# Patient Record
Sex: Female | Born: 1990 | Race: Black or African American | Hispanic: No | Marital: Single | Smoking: Current every day smoker
Health system: Southern US, Community
[De-identification: ages and names within clinical notes are randomized; demographics above are authoritative.]

## PROBLEM LIST (undated history)

## (undated) ENCOUNTER — Emergency Department (HOSPITAL_COMMUNITY): Admission: EM | Payer: Medicaid Other | Source: Home / Self Care

## (undated) DIAGNOSIS — Z87440 Personal history of urinary (tract) infections: Secondary | ICD-10-CM

## (undated) DIAGNOSIS — F329 Major depressive disorder, single episode, unspecified: Secondary | ICD-10-CM

## (undated) DIAGNOSIS — L02411 Cutaneous abscess of right axilla: Secondary | ICD-10-CM

## (undated) DIAGNOSIS — L02412 Cutaneous abscess of left axilla: Secondary | ICD-10-CM

## (undated) DIAGNOSIS — F32A Depression, unspecified: Secondary | ICD-10-CM

## (undated) DIAGNOSIS — Z789 Other specified health status: Secondary | ICD-10-CM

## (undated) DIAGNOSIS — Z8669 Personal history of other diseases of the nervous system and sense organs: Secondary | ICD-10-CM

## (undated) DIAGNOSIS — L732 Hidradenitis suppurativa: Secondary | ICD-10-CM

## (undated) DIAGNOSIS — J45909 Unspecified asthma, uncomplicated: Secondary | ICD-10-CM

## (undated) DIAGNOSIS — F319 Bipolar disorder, unspecified: Secondary | ICD-10-CM

## (undated) DIAGNOSIS — G8922 Chronic post-thoracotomy pain: Secondary | ICD-10-CM

## (undated) HISTORY — DX: Hidradenitis suppurativa: L73.2

## (undated) HISTORY — PX: TUBAL LIGATION: SHX77

## (undated) HISTORY — PX: INCISION AND DRAINAGE: SHX5863

## (undated) HISTORY — DX: Personal history of other diseases of the nervous system and sense organs: Z86.69

## (undated) HISTORY — DX: Chronic post-thoracotomy pain: G89.22

## (undated) HISTORY — DX: Personal history of urinary (tract) infections: Z87.440

## (undated) HISTORY — DX: Bipolar disorder, unspecified: F31.9

## (undated) HISTORY — PX: OTHER SURGICAL HISTORY: SHX169

---
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
## 2016-10-31 ENCOUNTER — Emergency Department
Admission: EM | Admit: 2016-10-31 | Discharge: 2016-10-31 | Disposition: A | Discharge status: Home / Self Care | Payer: Self-pay | Attending: Emergency Medicine | Admitting: Emergency Medicine

## 2016-10-31 DIAGNOSIS — L732 Hidradenitis suppurativa: Secondary | ICD-10-CM | POA: Insufficient documentation

## 2016-10-31 DIAGNOSIS — F17200 Nicotine dependence, unspecified, uncomplicated: Secondary | ICD-10-CM | POA: Insufficient documentation

## 2016-10-31 DIAGNOSIS — L02411 Cutaneous abscess of right axilla: Secondary | ICD-10-CM | POA: Insufficient documentation

## 2016-10-31 HISTORY — DX: Cutaneous abscess of left axilla: L02.412

## 2016-10-31 HISTORY — DX: Cutaneous abscess of right axilla: L02.411

## 2016-10-31 MED ORDER — DOXYCYCLINE HYCLATE 100 MG PO TABS: 100.0000 mg | ORAL_TABLET | Freq: Two times a day (BID) | ORAL | 0 refills | Status: DC

## 2016-10-31 MED ORDER — TRAMADOL HCL 50 MG PO TABS: 50.0000 mg | ORAL_TABLET | Freq: Two times a day (BID) | ORAL | 0 refills | Status: DC

## 2016-10-31 MED ORDER — PENTAFLUOROPROP-TETRAFLUOROETH EX AERO
1.0000 "application " | INHALATION_SPRAY | CUTANEOUS | Status: DC | PRN
  Filled 2016-10-31: qty 30

## 2016-10-31 MED ORDER — LIDOCAINE-EPINEPHRINE (PF) 1 %-1:200000 IJ SOLN
30.0000 mL | Freq: Once | INTRAMUSCULAR | Status: DC
  Filled 2016-10-31: qty 30

## 2016-10-31 MED ORDER — DOXYCYCLINE HYCLATE 100 MG PO TABS
100.0000 mg | ORAL_TABLET | Freq: Once | ORAL | Status: AC
  Administered 2016-10-31: 100 mg via ORAL
  Filled 2016-10-31: qty 1

## 2016-10-31 NOTE — ED Triage Notes (Signed)
Abscess under right axill approx dime in size. Pt hx of abscesses since she was young. Pt alert and oriented X4, active, cooperative, pt in NAD. RR even and unlabored, color WNL.  No fever. Pain since Sunday.

## 2016-10-31 NOTE — ED Provider Notes (Signed)
[facility] Emergency Department Provider Note ____________________________________________  Time seen: 16:21  I have reviewed the triage vital signs and the nursing notes.  HISTORY  Chief Complaint  Abscess  HPI Kristen CleverlyJaquisha Shoults is a 25 y.o. female presents to the ED with an abscess under her right axillae for 4 days now.  States it has been getting progressively worse.  Patient has a history of hidradenitis suppurativa and has a history of abscesses under her axillae for 17 years. Denies any abscesses in her groin.  She gets about 3-4 a year and treats them with warm compresses.  She admits to decreased ROM in her right arm due to the pain of the abscess.  Denies any spontaneous drainage, fevers, recent illness, or rashes. Took Ibuprofen without relief. Her pain is a 10/10 intensity at this time. She has never followed up with dermatology for her condition.   Past Medical History:  Diagnosis Date  . Abscesses of both axillae    There are no active problems to display for this patient.  History reviewed. No pertinent surgical history.  Prior to Admission medications   Medication Sig Start Date End Date Taking? Authorizing Provider  doxycycline (VIBRA-TABS) 100 MG tablet Take 1 tablet (100 mg total) by mouth 2 (two) times daily. 10/31/16   Jann Milkovich V Bacon Rhona Fusilier, PA-C  traMADol (ULTRAM) 50 MG tablet Take 1 tablet (50 mg total) by mouth 2 (two) times daily. 10/31/16   Sierra Bissonette V Bacon Vasiliy Mccarry, PA-C   Allergies Review of patient's allergies indicates no known allergies.  No family history on file.  Social History Social History  Substance Use Topics  . Smoking status: Current Every Day Smoker  . Smokeless tobacco: Not on file  . Alcohol use No   Review of Systems  Constitutional: Negative for fever. Cardiovascular: Negative for chest pain. Respiratory: Negative for shortness of breath. Gastrointestinal: Negative for abdominal pain, vomiting and  diarrhea. Musculoskeletal: Negative for back pain. Skin: admits to abscess in her right axillae Neurological: Negative for headaches, focal weakness or numbness. ____________________________________________  PHYSICAL EXAM:  VITAL SIGNS: ED Triage Vitals  Enc Vitals Group     BP 10/31/16 1609 (!) 138/91     Pulse Rate 10/31/16 1609 98     Resp 10/31/16 1609 18     Temp 10/31/16 1609 98.8 F (37.1 C)     Temp Source 10/31/16 1609 Oral     SpO2 10/31/16 1609 100 %     Weight 10/31/16 1609 168 lb (76.2 kg)     Height 10/31/16 1609 5\' 8"  (1.727 m)     Head Circumference --      Peak Flow --      Pain Score 10/31/16 1610 10     Pain Loc --      Pain Edu? --      Excl. in GC? --    Constitutional: Alert and oriented. Well appearing and in no distress. Head: Normocephalic and atraumatic. Cardiovascular: Normal rate, regular rhythm. Normal distal pulses. Respiratory: Normal respiratory effort. No wheezes/rales/rhonchi. Musculoskeletal: Nontender with normal range of motion in all extremities.  Neurologic:  Normal gait without ataxia. Normal speech and language. No gross focal neurologic deficits are appreciated. Skin:  A  2.5cm abscess is noted in the middle of her right axillae.  There are previous scars from past hidradenitis suppurativa flares.   Psychiatric: Mood and affect are normal. Patient exhibits appropriate insight and judgment. ____________________________________________   PROCEDURES Doxycycline 100 mg PO  INCISION  AND DRAINAGE Performed by: Wilber Bihariebeka Coleman PA-S Consent: Verbal consent obtained. Risks and benefits: risks, benefits and alternatives were discussed Type: abscess  Body area: underneath the right axillae  Anesthesia: local infiltration  Incision was made with a scalpel.  Local anesthetic: lidocaine 1% with epinephrine  Anesthetic total: 4 ml  Complexity: complex Blunt dissection to break up loculations  Drainage: purulent  Drainage amount:  small  Packing material: 1/4 in iodoform gauze  Patient tolerance: Patient tolerated the procedure well with no immediate complications. ____________________________________________  INITIAL IMPRESSION / ASSESSMENT AND PLAN / ED COURSE  Kristen Haney is a 25 year old female presenting to the ED with an abscess under her right axillae for 4 days.  She has a past medical history of hidradenitis suppurativa and has a history of abscesses under both her axillae.  I&D was done in the ED to drain the abscess and give the patient some relief.  Prescribed Doxycycline 100mg  and Ultram.  Educated patient to keep area clean and dry for the next couple days.  Follow-up with a dermatologist or general surgery as needed or if another abscess forms.    Clinical Course   ____________________________________________  FINAL CLINICAL IMPRESSION(S) / ED DIAGNOSES  Final diagnoses:  Abscess of axilla, right  Hidradenitis suppurativa of right axilla     Lissa HoardJenise V Bacon Owen Pagnotta, PA-C 10/31/16 1808    Loleta Roseory Forbach, MD 10/31/16 2013

## 2016-10-31 NOTE — Discharge Instructions (Signed)
Keep area clean and dry for 24 hours.  Clean with soap and water and avoid putting deodorant over the wound for a couple days.  Follow up with General surgery if symptoms get worse and abscess comes back.

## 2016-10-31 NOTE — ED Notes (Signed)
Pt has abscess to right arm pit area, pt states hx of same, provider at bedside

## 2016-12-20 ENCOUNTER — Emergency Department
Admission: EM | Admit: 2016-12-20 | Discharge: 2016-12-20 | Disposition: A | Discharge status: Home / Self Care | Payer: Self-pay | Attending: Emergency Medicine | Admitting: Emergency Medicine

## 2016-12-20 DIAGNOSIS — F17200 Nicotine dependence, unspecified, uncomplicated: Secondary | ICD-10-CM | POA: Insufficient documentation

## 2016-12-20 DIAGNOSIS — R110 Nausea: Secondary | ICD-10-CM | POA: Insufficient documentation

## 2016-12-20 DIAGNOSIS — N390 Urinary tract infection, site not specified: Secondary | ICD-10-CM | POA: Insufficient documentation

## 2016-12-20 LAB — URINALYSIS, COMPLETE (UACMP) WITH MICROSCOPIC
BILIRUBIN URINE: NEGATIVE
Glucose, UA: NEGATIVE mg/dL
Ketones, ur: NEGATIVE mg/dL
NITRITE: NEGATIVE
PH: 6 (ref 5.0–8.0)
Protein, ur: NEGATIVE mg/dL
SPECIFIC GRAVITY, URINE: 1.01 (ref 1.005–1.030)

## 2016-12-20 LAB — POCT PREGNANCY, URINE: Preg Test, Ur: NEGATIVE

## 2016-12-20 MED ORDER — SULFAMETHOXAZOLE-TRIMETHOPRIM 800-160 MG PO TABS: 1.0000 | ORAL_TABLET | Freq: Two times a day (BID) | ORAL | 0 refills | Status: DC

## 2016-12-20 MED ORDER — NAPROXEN 500 MG PO TABS: 500.0000 mg | ORAL_TABLET | Freq: Two times a day (BID) | ORAL | 0 refills | Status: DC

## 2016-12-20 MED ORDER — KETOROLAC TROMETHAMINE 30 MG/ML IJ SOLN
30.0000 mg | Freq: Once | INTRAMUSCULAR | Status: AC
  Administered 2016-12-20: 30 mg via INTRAMUSCULAR
  Filled 2016-12-20: qty 1

## 2016-12-20 MED ORDER — PHENAZOPYRIDINE HCL 100 MG PO TABS: 100.0000 mg | ORAL_TABLET | Freq: Three times a day (TID) | ORAL | 0 refills | Status: DC | PRN

## 2016-12-20 NOTE — ED Triage Notes (Signed)
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

## 2016-12-20 NOTE — ED Provider Notes (Signed)
[facility] Emergency Department Provider Note  ____________________________________________  Time seen: Approximately 9:48 AM  I have reviewed the triage vital signs and the nursing notes.   HISTORY  Chief Complaint Back Pain    HPI Kristen Haney is a 25 y.o. female , NAD, presents to the emergency department with 2 day history of right lower back pain.States pain began yesterday but denies any specific injury or trauma that caused the pain. States pain began as a deep ache but has escalated over the last 24 hours. Pain does not radiate and she denies any flank pain. No saddle paresthesias or loss of bowel or bladder control. Has not noted any redness or rashes. States that she has a history of UTIs that start with back pain and this feels similar. Noted a strong odor to her urine today and it seemed to be more yellow than usual. Denies any fevers, chills or body aches. Has had no abdominal pain but has felt "queasy" at times. No vomiting or diarrhea.   Past Medical History:  Diagnosis Date  . Abscesses of both axillae     There are no active problems to display for this patient.   History reviewed. No pertinent surgical history.  Prior to Admission medications   Medication Sig Start Date End Date Taking? Authorizing Provider  naproxen (NAPROSYN) 500 MG tablet Take 1 tablet (500 mg total) by mouth 2 (two) times daily with a meal. 12/20/16   Akasia Ahmad L Parminder Cupples, PA-C  phenazopyridine (PYRIDIUM) 100 MG tablet Take 1 tablet (100 mg total) by mouth 3 (three) times daily as needed for pain (May take 1-2 as needed three times daily). 12/20/16   Skyrah Krupp L Tashawn Greff, PA-C  sulfamethoxazole-trimethoprim (BACTRIM DS,SEPTRA DS) 800-160 MG tablet Take 1 tablet by mouth 2 (two) times daily. 12/20/16   Juda Toepfer L Jesenya Bowditch, PA-C    Allergies Patient has no known allergies.  No family history on file.  Social History Social History  Substance Use Topics  . Smoking status: Current  Every Day Smoker  . Smokeless tobacco: Never Used  . Alcohol use No     Review of Systems  Constitutional: No fever/chills Cardiovascular: No chest pain. Respiratory:  No shortness of breath.  Gastrointestinal: Positive nausea without vomiting. No abdominal pain.   Genitourinary: Positive malodorous urine. Negative for dysuria, hematuria. No urinary hesitancy, urgency or increased frequency. Musculoskeletal: Positive for non-radiating back pain.  Skin: Negative for rash, redness, swelling, bruising or skin sores. Neurological: Negative for paresthesias or loss of bowel or bladder control. No numbness, weakness, tingling of the extremities. 10-point ROS otherwise negative.  ____________________________________________   PHYSICAL EXAM:  VITAL SIGNS: ED Triage Vitals  Enc Vitals Group     BP 12/20/16 0934 112/75     Pulse Rate 12/20/16 0934 78     Resp 12/20/16 0934 17     Temp 12/20/16 0934 98.3 F (36.8 C)     Temp Source 12/20/16 0934 Oral     SpO2 12/20/16 0934 100 %     Weight 12/20/16 0934 165 lb (74.8 kg)     Height 12/20/16 0934 5\' 8"  (1.727 m)     Head Circumference --      Peak Flow --      Pain Score 12/20/16 0935 10     Pain Loc --      Pain Edu? --      Excl. in GC? --      Constitutional: Alert and oriented. Well appearing and in  no acute distress. Eyes: Conjunctivae are normal.   Head: Atraumatic. Cardiovascular: Normal rate, regular rhythm. Normal S1 and S2.  Good peripheral circulation. Respiratory: Normal respiratory effort without tachypnea or retractions. Lungs CTABWith breath sounds noted in all lung fields. No wheeze, rhonchi, rales. Gastrointestinal: Soft and nontender Without distention or guarding in all quadrants. No rebound or rigidity. Bowel sounds present normoactive in all quadrants. No CVA tenderness. Musculoskeletal: No lower extremity tenderness nor edema.  No joint effusions. Neurologic:  Normal speech and language. No gross focal  neurologic deficits are appreciated.  Skin:  Skin is warm, dry and intact. No rash noted. Psychiatric: Mood and affect are normal. Speech and behavior are normal. Patient exhibits appropriate insight and judgement.   ____________________________________________   LABS (all labs ordered are listed, but only abnormal results are displayed)  Labs Reviewed  URINALYSIS, COMPLETE (UACMP) WITH MICROSCOPIC - Abnormal; Notable for the following:       Result Value   Color, Urine YELLOW (*)    APPearance CLEAR (*)    Hgb urine dipstick SMALL (*)    Leukocytes, UA MODERATE (*)    Bacteria, UA RARE (*)    Squamous Epithelial / LPF 0-5 (*)    All other components within normal limits  URINE CULTURE  POC URINE PREG, ED  POCT PREGNANCY, URINE   ____________________________________________  EKG  None ____________________________________________  RADIOLOGY  None ____________________________________________    PROCEDURES  Procedure(s) performed: None   Procedures   Medications  ketorolac (TORADOL) 30 MG/ML injection 30 mg (30 mg Intramuscular Given 12/20/16 1056)   ____________________________________________   INITIAL IMPRESSION / ASSESSMENT AND PLAN / ED COURSE  Pertinent labs & imaging results that were available during my care of the patient were reviewed by me and considered in my medical decision making (see chart for details).  Clinical Course     Patient's diagnosis is consistent with Urinary tract infection. Patient will be discharged home with prescriptions for Bactrim DS, Pyridium and Naprosyn to take as directed. [facility] if symptoms persist past this treatment course. Patient is given ED precautions to return to the ED for any worsening or new symptoms.    ____________________________________________  FINAL CLINICAL IMPRESSION(S) / ED DIAGNOSES  Final diagnoses:  Urinary tract infection without hematuria, site  unspecified      NEW MEDICATIONS STARTED DURING THIS VISIT:  New Prescriptions   NAPROXEN (NAPROSYN) 500 MG TABLET    Take 1 tablet (500 mg total) by mouth 2 (two) times daily with a meal.   PHENAZOPYRIDINE (PYRIDIUM) 100 MG TABLET    Take 1 tablet (100 mg total) by mouth 3 (three) times daily as needed for pain (May take 1-2 as needed three times daily).   SULFAMETHOXAZOLE-TRIMETHOPRIM (BACTRIM DS,SEPTRA DS) 800-160 MG TABLET    Take 1 tablet by mouth 2 (two) times daily.         Hope PigeonJami L Beaulah Romanek, PA-C 12/20/16 1124    Nita Sicklearolina Veronese, MD 12/21/16 [phone]

## 2016-12-20 NOTE — ED Notes (Signed)
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

## 2016-12-22 LAB — URINE CULTURE
Culture: >=100000 — AB
Special Requests: NORMAL

## 2017-03-02 ENCOUNTER — Emergency Department
Admission: EM | Admit: 2017-03-02 | Discharge: 2017-03-02 | Disposition: A | Discharge status: Home / Self Care | Payer: Self-pay | Attending: Emergency Medicine | Admitting: Emergency Medicine

## 2017-03-02 ENCOUNTER — Encounter: Payer: Self-pay | Admitting: Emergency Medicine

## 2017-03-02 DIAGNOSIS — Z79899 Other long term (current) drug therapy: Secondary | ICD-10-CM | POA: Insufficient documentation

## 2017-03-02 DIAGNOSIS — M25561 Pain in right knee: Secondary | ICD-10-CM | POA: Insufficient documentation

## 2017-03-02 DIAGNOSIS — F17200 Nicotine dependence, unspecified, uncomplicated: Secondary | ICD-10-CM | POA: Insufficient documentation

## 2017-03-02 DIAGNOSIS — R202 Paresthesia of skin: Secondary | ICD-10-CM

## 2017-03-02 MED ORDER — PREDNISONE 10 MG PO TABS: 10.0000 mg | ORAL_TABLET | Freq: Every day | ORAL | 0 refills | Status: DC

## 2017-03-02 NOTE — ED Provider Notes (Signed)
[facility]-EMERGENCY DEPARTMENT Provider Note   CSN: [id number] Arrival date & time: 03/02/17  1516     History   Chief Complaint Chief Complaint  Patient presents with  . Numbness    HPI Kristen Haney is a 26 y.o. female.Presents to the emergency department for evaluation of right leg pain and numbness. Patient points to her tibial tubercle, no she has had new swelling to the area with numbness on the anterior shin. Symptoms been present for 4 days. No significant trauma or injury. 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 She denies any weakness, back pain. She's been taking Tylenol with no improvement. She denies any loss of bowel or bladder symptoms. Numbness does extend into the front of the leg occasionally into the mid shin.  HPI  Past Medical History:  Diagnosis Date  . Abscesses of both axillae     There are no active problems to display for this patient.   No past surgical history on file.  OB History    No data available       Home Medications    Prior to Admission medications   Medication Sig Start Date End Date Taking? Authorizing Provider  naproxen (NAPROSYN) 500 MG tablet Take 1 tablet (500 mg total) by mouth 2 (two) times daily with a meal. 12/20/16   Jami L Hagler, PA-C  phenazopyridine (PYRIDIUM) 100 MG tablet Take 1 tablet (100 mg total) by mouth 3 (three) times daily as needed for pain (May take 1-2 as needed three times daily). 12/20/16   Jami L Hagler, PA-C  predniSONE (DELTASONE) 10 MG tablet Take 1 tablet (10 mg total) by mouth daily. 6,5,4,3,2,1 six day taper 03/02/17   Evon Slackhomas C Quilla Freeze, PA-C  sulfamethoxazole-trimethoprim (BACTRIM DS,SEPTRA DS) 800-160 MG tablet Take 1 tablet by mouth 2 (two) times daily. 12/20/16   Jami L Hagler, PA-C    Family History No family history on file.  Social History Social History  Substance Use Topics  . Smoking status: Current Every Day Smoker  . Smokeless tobacco: Never Used  . Alcohol  use No     Allergies   Patient has no known allergies.   Review of Systems Review of Systems  Constitutional: Negative for activity change, chills, fatigue and fever.  HENT: Negative for congestion, sinus pressure and sore throat.   Eyes: Negative for visual disturbance.  Respiratory: Negative for cough, chest tightness and shortness of breath.   Cardiovascular: Negative for chest pain and leg swelling.  Gastrointestinal: Negative for abdominal pain, diarrhea, nausea and vomiting.  Genitourinary: Negative for dysuria.  Musculoskeletal: Positive for arthralgias. Negative for gait problem.  Skin: Negative for rash.  Neurological: Positive for numbness. Negative for weakness and headaches.  Hematological: Negative for adenopathy.  Psychiatric/Behavioral: Negative for agitation, behavioral problems and confusion.     Physical Exam Updated Vital Signs BP (!) 142/84 (BP Location: Left Arm)   Pulse 89   Temp 98.6 F (37 C) (Oral)   Resp 17   Ht 5\' 8"  (1.727 m)   Wt 75.8 kg   LMP 02/19/2017 (Approximate)   SpO2 100%   BMI 25.39 kg/m   Physical Exam  Constitutional: She is oriented to person, place, and time. She appears well-developed and well-nourished. No distress.  HENT:  Head: Normocephalic and atraumatic.  Mouth/Throat: Oropharynx is clear and moist.  Eyes: EOM are normal. Pupils are equal, round, and reactive to light. Right eye exhibits no discharge. Left eye exhibits no discharge.  Neck:  Normal range of motion. Neck supple.  Cardiovascular: Normal rate, regular rhythm, normal heart sounds and intact distal pulses.   Pulmonary/Chest: Effort normal and breath sounds normal. No respiratory distress. She has no wheezes. She has no rales.  Musculoskeletal:  Examination of the right lower extremity shows patient has full range of motion of the hips knees and ankles no discomfort. She is slightly tender on the on the right tibial tubercle on no warmth or erythema. She has 5  out of 5 strength with ankle plantarflexion, dorsiflexion, knee extension, hip flexion, adduction, abduction. Patient has sensation is intact throughout, slightly decreased on the right proximal anterior tibia compared to the left. Reflexes are symmetric no clonus is noted. She ambulates with no antalgic gait.. Negative Homans sign.   Neurological: She is alert and oriented to person, place, and time. She has normal reflexes.  Skin: Skin is warm and dry.  Psychiatric: She has a normal mood and affect. Her behavior is normal. Thought content normal.     ED Treatments / Results  Labs (all labs ordered are listed, but only abnormal results are displayed) Labs Reviewed - No data to display  EKG  EKG Interpretation None       Radiology No results found.  Procedures Procedures (including critical care time)  Medications Ordered in ED Medications - No data to display   Initial Impression / Assessment and Plan / ED Course  I have reviewed the triage vital signs and the nursing notes.  Pertinent labs & imaging results that were available during my care of the patient were reviewed by me and considered in my medical decision making (see chart for details).     26 year old female with right knee tibial tubercle discomfort and anterior knee numbness. Patient performs repetitive kneeling. Numbness could be coming from superficial nerve damage. No weakness noted on exam. No gross neurological deficit. Patient educated on signs and symptoms to return to the ED for.  Final Clinical Impressions(s) / ED Diagnoses   Final diagnoses:  Paresthesia  Acute pain of right knee    New Prescriptions New Prescriptions   PREDNISONE (DELTASONE) 10 MG TABLET    Take 1 tablet (10 mg total) by mouth daily. 6,5,4,3,2,1 six day taper     Evon Slack, PA-C 03/02/17 1611    Phineas Semen, MD 03/02/17 [phone]

## 2017-03-02 NOTE — ED Triage Notes (Signed)
Pt reports right leg numbness x4 days, reports it starts at her knee and moves downward. Pt reports the next day, she started having right knee pain and "popping" since then. Pt ambulatory to triage with no difficulty or distress.

## 2017-03-02 NOTE — Discharge Instructions (Signed)
Please take medication as prescribed. Avoid kneeling on the right knee. Follow-up with primary care provider or [facility] in 5-7 days if no improvement. If any weakness, worsening pain or numbness return to the emergency department immediately.

## 2017-03-02 NOTE — ED Notes (Signed)

## 2017-03-22 ENCOUNTER — Encounter: Payer: Self-pay | Admitting: Medical Oncology

## 2017-03-22 ENCOUNTER — Emergency Department
Admission: EM | Admit: 2017-03-22 | Discharge: 2017-03-22 | Disposition: A | Discharge status: Home / Self Care | Payer: Self-pay | Attending: Emergency Medicine | Admitting: Emergency Medicine

## 2017-03-22 DIAGNOSIS — K529 Noninfective gastroenteritis and colitis, unspecified: Secondary | ICD-10-CM | POA: Insufficient documentation

## 2017-03-22 DIAGNOSIS — F17200 Nicotine dependence, unspecified, uncomplicated: Secondary | ICD-10-CM | POA: Insufficient documentation

## 2017-03-22 LAB — URINALYSIS, COMPLETE (UACMP) WITH MICROSCOPIC
Bacteria, UA: NONE SEEN
Bilirubin Urine: NEGATIVE
GLUCOSE, UA: NEGATIVE mg/dL
HGB URINE DIPSTICK: NEGATIVE
KETONES UR: NEGATIVE mg/dL
Leukocytes, UA: NEGATIVE
NITRITE: NEGATIVE
PROTEIN: NEGATIVE mg/dL
Specific Gravity, Urine: 1.016 (ref 1.005–1.030)
pH: 8 (ref 5.0–8.0)

## 2017-03-22 LAB — POCT PREGNANCY, URINE: Preg Test, Ur: NEGATIVE

## 2017-03-22 LAB — PREGNANCY, URINE: Preg Test, Ur: NEGATIVE

## 2017-03-22 MED ORDER — ONDANSETRON HCL 4 MG/2ML IJ SOLN: 4.0000 mg | Freq: Once | INTRAMUSCULAR | Status: DC

## 2017-03-22 MED ORDER — ONDANSETRON HCL 4 MG PO TABS: 4.0000 mg | ORAL_TABLET | Freq: Three times a day (TID) | ORAL | 0 refills | Status: DC | PRN

## 2017-03-22 MED ORDER — SODIUM CHLORIDE 0.9 % IV BOLUS (SEPSIS): 1000.0000 mL | Freq: Once | INTRAVENOUS | Status: DC

## 2017-03-22 MED ORDER — ONDANSETRON 4 MG PO TBDP
4.0000 mg | ORAL_TABLET | Freq: Once | ORAL | Status: AC
  Administered 2017-03-22: 4 mg via ORAL
  Filled 2017-03-22: qty 1

## 2017-03-22 NOTE — ED Provider Notes (Signed)
[facility] Emergency Department Provider Note  ____________________________________________   I have reviewed the triage vital signs and the nursing notes.   HISTORY  Chief Complaint Abdominal Pain; Emesis; and Diarrhea    HPI Kristen Haney is a 26 y.o. female Who is at baseline healthy states that she had nausea vomiting and diarrhea since yesterday evening. Has not yet throughout today.No bloody stool no melena no hematemesis. Had some cramping when she was actually having a bowel movement but none since. Denies ongoing abdominal pain. Denies fever or chills.Is unsure if she is pregnant.      Past Medical History:  Diagnosis Date  . Abscesses of both axillae     There are no active problems to display for this patient.   History reviewed. No pertinent surgical history.  Prior to Admission medications   Medication Sig Start Date End Date Taking? Authorizing Provider  naproxen (NAPROSYN) 500 MG tablet Take 1 tablet (500 mg total) by mouth 2 (two) times daily with a meal. Patient not taking: Reported on 03/22/2017 12/20/16   Jami L Hagler, PA-C  phenazopyridine (PYRIDIUM) 100 MG tablet Take 1 tablet (100 mg total) by mouth 3 (three) times daily as needed for pain (May take 1-2 as needed three times daily). Patient not taking: Reported on 03/22/2017 12/20/16   Jami L Hagler, PA-C  predniSONE (DELTASONE) 10 MG tablet Take 1 tablet (10 mg total) by mouth daily. 6,5,4,3,2,1 six day taper Patient not taking: Reported on 03/22/2017 03/02/17   Evon Slack, PA-C  sulfamethoxazole-trimethoprim (BACTRIM DS,SEPTRA DS) 800-160 MG tablet Take 1 tablet by mouth 2 (two) times daily. Patient not taking: Reported on 03/22/2017 12/20/16   Jami L Hagler, PA-C    Allergies Patient has no known allergies.  No family history on file.  Social History Social History  Substance Use Topics  . Smoking status: Current Every Day Smoker  . Smokeless tobacco: Never Used  .  Alcohol use No    Review of Systems {** Revise as appropriate then delete this line - Documentation of 10 systems OR 2 systems and "10-point ROS otherwise negative" is required **Constitutional: No fever/chills Eyes: No visual changes. ENT: No sore throat. No stiff neck no neck pain Cardiovascular: Denies chest pain. Respiratory: Denies shortness of breath. Gastrointestinal:   no vomiting.  No diarrhea.  No constipation. Genitourinary: Negative for dysuria. Musculoskeletal: Negative lower extremity swelling Skin: Negative for rash. Neurological: Negative for severe headaches, focal weakness or numbness. 10-point ROS otherwise negative.  ____________________________________________   PHYSICAL EXAM:  VITAL SIGNS: ED Triage Vitals [03/22/17 0914]  Enc Vitals Group     BP 113/65     Pulse Rate 85     Resp 17     Temp 98 F (36.7 C)     Temp Source Oral     SpO2 100 %     Weight 167 lb (75.8 kg)     Height 5\' 8"  (1.727 m)     Head Circumference      Peak Flow      Pain Score 10     Pain Loc      Pain Edu?      Excl. in GC?     Constitutional: Alert and oriented. Well appearing and in no acute distress. Eyes: Conjunctivae are normal. PERRL. EOMI. Head: Atraumatic. Nose: No congestion/rhinnorhea. Mouth/Throat: Mucous membranes are moist.  Oropharynx non-erythematous. Neck: No stridor.   Nontender with no meningismus Cardiovascular: Normal rate, regular rhythm. Grossly normal heart  sounds.  Good peripheral circulation. Respiratory: Normal respiratory effort.  No retractions. Lungs CTAB. Abdominal: Soft and nontender. No distention. No guarding no rebound Back:  There is no focal tenderness or step off.  there is no midline tenderness there are no lesions noted. there is no CVA tenderness Musculoskeletal: No lower extremity tenderness, no upper extremity tenderness. No joint effusions, no DVT signs strong distal pulses no edema Neurologic:  Normal speech and language. No  gross focal neurologic deficits are appreciated.  Skin:  Skin is warm, dry and intact. No rash noted. Psychiatric: Mood and affect are normal. Speech and behavior are normal.  ____________________________________________   LABS (all labs ordered are listed, but only abnormal results are displayed)  Labs Reviewed  URINALYSIS, COMPLETE (UACMP) WITH MICROSCOPIC - Abnormal; Notable for the following:       Result Value   Color, Urine YELLOW (*)    APPearance CLEAR (*)    Squamous Epithelial / LPF 0-5 (*)    All other components within normal limits  PREGNANCY, URINE  POCT PREGNANCY, URINE   ____________________________________________  EKG  I personally interpreted any EKGs ordered by me or triage  ____________________________________________  RADIOLOGY  I reviewed any imaging ordered by me or triage that were performed during my shift and, if possible, patient and/or family made aware of any abnormal findings. ____________________________________________   PROCEDURES  Procedure(s) performed: None  Procedures  Critical Care performed: None  ____________________________________________   INITIAL IMPRESSION / ASSESSMENT AND PLAN / ED COURSE  Pertinent labs & imaging results that were available during my care of the patient were reviewed by me and considered in my medical decision making (see chart for details).  Patient here with nausea vomiting diarrhea abdomen is completely benign, we will give her antiemetics and see if she can tolerate by mouth.  She misread she maybe threw up 3 times total. We are giving her antiemetics. Since this for less than 12 hours and don't think blood work is likely to reveal anything. Abdomen completely benign nothing to suggest PID, TOA, ovarian torsion or cyst, appendicitis, gallbladder disease, obstruction, or any other intra-abdominal pathology. Patient is not dehydrated clinically and she has no ketones in her urine. She is not pregnant.  We will continue to encourage oral hydration. At this time most consistent with viral pathology. No evidence of other significant pathology noted.    ____________________________________________   FINAL CLINICAL IMPRESSION(S) / ED DIAGNOSES  Final diagnoses:  None      This chart was dictated using voice recognition software.  Despite best efforts to proofread,  errors can occur which can change meaning.      Jeanmarie PlantJames A Shemar Plemmons, MD 03/22/17 1026

## 2017-03-22 NOTE — ED Triage Notes (Signed)
Pt reports lower abd pain with nvd that began last night.

## 2017-03-22 NOTE — ED Notes (Signed)
Pt verbalized understanding of discharge instructions. Pt denies n/v/d since placed in ED room.  NAD at this time.

## 2017-07-06 ENCOUNTER — Encounter: Payer: Self-pay | Admitting: Emergency Medicine

## 2017-07-06 ENCOUNTER — Emergency Department
Admission: EM | Admit: 2017-07-06 | Discharge: 2017-07-06 | Disposition: A | Discharge status: Home / Self Care | Payer: Self-pay | Attending: Emergency Medicine | Admitting: Emergency Medicine

## 2017-07-06 DIAGNOSIS — F17200 Nicotine dependence, unspecified, uncomplicated: Secondary | ICD-10-CM | POA: Insufficient documentation

## 2017-07-06 DIAGNOSIS — R0981 Nasal congestion: Secondary | ICD-10-CM | POA: Insufficient documentation

## 2017-07-06 MED ORDER — PSEUDOEPHEDRINE HCL ER 120 MG PO TB12: 120.0000 mg | ORAL_TABLET | Freq: Two times a day (BID) | ORAL | 2 refills | Status: DC | PRN

## 2017-07-06 NOTE — ED Notes (Signed)
Pt verbalizes understanding of discharge instructions.

## 2017-07-06 NOTE — ED Triage Notes (Signed)
Patient presents to the ED with nasal congestion that began today.  Denies other symptoms.

## 2017-07-06 NOTE — Discharge Instructions (Signed)
Take the medication as prescribed. Follow up with the primary care provider of your choice for symptoms of concern.

## 2017-07-06 NOTE — ED Provider Notes (Signed)
[facility] Emergency Department Provider Note  ____________________________________________  Time seen: Approximately 4:38 PM  I have reviewed the triage vital signs and the nursing notes.   HISTORY  Chief Complaint Nasal Congestion   HPI Kristen Haney is a 26 y.o. female who presents to the emergency department for evaluation and treatment of nasal condition. She states that when she awakened this morning, she was unable to breathe through her nose and has had to breathe through her mouth all day. She denies other symptoms such as fever, sore throat, earache, cough, nausea, vomiting, or diarrhea. She has not taken any over-the-counter medications or attempted any alleviating measures for this complaint. She needs a work note.  Past Medical History:  Diagnosis Date  . Abscesses of both axillae     There are no active problems to display for this patient.   History reviewed. No pertinent surgical history.  Prior to Admission medications   Medication Sig Start Date End Date Taking? Authorizing Provider  naproxen (NAPROSYN) 500 MG tablet Take 1 tablet (500 mg total) by mouth 2 (two) times daily with a meal. Patient not taking: Reported on 03/22/2017 12/20/16   Hagler, Jami L, PA-C  ondansetron (ZOFRAN) 4 MG tablet Take 1 tablet (4 mg total) by mouth every 8 (eight) hours as needed for nausea or vomiting. 03/22/17   Jeanmarie PlantMcShane, James A, MD  phenazopyridine (PYRIDIUM) 100 MG tablet Take 1 tablet (100 mg total) by mouth 3 (three) times daily as needed for pain (May take 1-2 as needed three times daily). Patient not taking: Reported on 03/22/2017 12/20/16   Hagler, Jami L, PA-C  predniSONE (DELTASONE) 10 MG tablet Take 1 tablet (10 mg total) by mouth daily. 6,5,4,3,2,1 six day taper Patient not taking: Reported on 03/22/2017 03/02/17   Evon SlackGaines, Thomas C, PA-C  pseudoephedrine (SUDAFED) 120 MG 12 hr tablet Take 1 tablet (120 mg total) by mouth 2 (two) times daily as needed  for congestion. 07/06/17 07/06/18  Pace Lamadrid, Rulon Eisenmengerari B, FNP  sulfamethoxazole-trimethoprim (BACTRIM DS,SEPTRA DS) 800-160 MG tablet Take 1 tablet by mouth 2 (two) times daily. Patient not taking: Reported on 03/22/2017 12/20/16   Hagler, Ernestene KielJami L, PA-C    Allergies Patient has no known allergies.  No family history on file.  Social History Social History  Substance Use Topics  . Smoking status: Current Every Day Smoker  . Smokeless tobacco: Never Used  . Alcohol use No    Review of Systems Constitutional: Negative for fe ver/chills ENT: Negative for sore throat. Positive for nasal congestion Cardiovascular: Denies chest pain. Respiratory: Negative for shortness of breath. Negative for cough. Gastrointestinal: Negative for nausea,  no vomiting.  Negative for diarrhea.  Musculoskeletal: Negative for body aches Skin: Negative for rash. Neurological: Negative for headaches ____________________________________________   PHYSICAL EXAM:  VITAL SIGNS: ED Triage Vitals  Enc Vitals Group     BP 07/06/17 1511 106/83     Pulse Rate 07/06/17 1511 66     Resp 07/06/17 1511 18     Temp 07/06/17 1511 98.9 F (37.2 C)     Temp Source 07/06/17 1511 Oral     SpO2 07/06/17 1511 100 %     Weight 07/06/17 1512 166 lb (75.3 kg)     Height 07/06/17 1512 5\' 8"  (1.727 m)     Head Circumference --      Peak Flow --      Pain Score --      Pain Loc --  Pain Edu? --      Excl. in GC? --     Constitutional: Alert and oriented. Well appearing and in no acute distress. Eyes: Conjunctivae are normal. EOMI. Ears: Bilateral tympanic membranes appear normal. Nose: Bilateral nasal congestion noted; no rhinnorhea. Mouth/Throat: Mucous membranes are moist.  Oropharynx normal. Tonsils normal. Neck: No stridor.  Lymphatic: No cervical lymphadenopathy. Cardiovascular: Normal rate, regular rhythm. Good peripheral circulation. Respiratory: Normal respiratory effort.  No retractions. Clear to auscultation  throughout. Gastrointestinal: Soft and nontender.  Musculoskeletal: FROM x 4 extremities.  Neurologic:  Normal speech and language.  Skin:  Skin is warm, dry and intact. No rash noted. Psychiatric: Mood and affect are normal. Speech and behavior are normal.  ____________________________________________   LABS (all labs ordered are listed, but only abnormal results are displayed)  Labs Reviewed - No data to display ____________________________________________  EKG  Not indicated ____________________________________________  RADIOLOGY  Not indicated ____________________________________________   PROCEDURES  Procedure(s) performed: None  Critical Care performed: No ____________________________________________   INITIAL IMPRESSION / ASSESSMENT AND PLAN / ED COURSE  26 year old female presenting to the emergency department for treatment of the single complaint of nasal congestion. She will be given a prescription for Sudafed and advised to see the primary care provider of her choice for symptoms that are not improving over the next few days. She was provided a work note for today.  Pertinent labs & imaging results that were available during my care of the patient were reviewed by me and considered in my medical decision making (see chart for details).  New Prescriptions   PSEUDOEPHEDRINE (SUDAFED) 120 MG 12 HR TABLET    Take 1 tablet (120 mg total) by mouth 2 (two) times daily as needed for congestion.    If controlled substance prescribed during this visit, 12 month history viewed on the [facility] prior to issuing an initial prescription for Schedule II or III opiod. ____________________________________________   FINAL CLINICAL IMPRESSION(S) / ED DIAGNOSES  Final diagnoses:  Nasal congestion    Note:  This document was prepared using Dragon voice recognition software and may include unintentional dictation errors.     Chinita Pester, FNP 07/06/17 1641     Jeanmarie Plant, MD 07/06/17 2256

## 2017-08-03 ENCOUNTER — Emergency Department
Admission: EM | Admit: 2017-08-03 | Discharge: 2017-08-04 | Disposition: A | Discharge status: Home / Self Care | Payer: Medicaid Other | Attending: Emergency Medicine | Admitting: Emergency Medicine

## 2017-08-03 ENCOUNTER — Encounter: Payer: Self-pay | Admitting: Emergency Medicine

## 2017-08-03 DIAGNOSIS — F17200 Nicotine dependence, unspecified, uncomplicated: Secondary | ICD-10-CM | POA: Diagnosis not present

## 2017-08-03 DIAGNOSIS — N3000 Acute cystitis without hematuria: Secondary | ICD-10-CM | POA: Insufficient documentation

## 2017-08-03 DIAGNOSIS — R112 Nausea with vomiting, unspecified: Secondary | ICD-10-CM | POA: Diagnosis not present

## 2017-08-03 LAB — COMPREHENSIVE METABOLIC PANEL
ALT: 14 U/L (ref 14–54)
ANION GAP: 6 (ref 5–15)
AST: 18 U/L (ref 15–41)
Albumin: 3.6 g/dL (ref 3.5–5.0)
Alkaline Phosphatase: 47 U/L (ref 38–126)
BUN: 14 mg/dL (ref 6–20)
CHLORIDE: 107 mmol/L (ref 101–111)
CO2: 25 mmol/L (ref 22–32)
Calcium: 8.7 mg/dL — ABNORMAL LOW (ref 8.9–10.3)
Creatinine, Ser: 0.69 mg/dL (ref 0.44–1.00)
GFR calc non Af Amer: >60 mL/min (ref >60)
Glucose, Bld: 92 mg/dL (ref 65–99)
POTASSIUM: 3.7 mmol/L (ref 3.5–5.1)
SODIUM: 138 mmol/L (ref 135–145)
Total Bilirubin: 0.7 mg/dL (ref 0.3–1.2)
Total Protein: 6.4 g/dL — ABNORMAL LOW (ref 6.5–8.1)

## 2017-08-03 LAB — LIPASE, BLOOD: LIPASE: 26 U/L (ref 11–51)

## 2017-08-03 LAB — CBC
HEMATOCRIT: 34.5 % — AB (ref 35.0–47.0)
HEMOGLOBIN: 11.8 g/dL — AB (ref 12.0–16.0)
MCH: 29.2 pg (ref 26.0–34.0)
MCHC: 34.3 g/dL (ref 32.0–36.0)
MCV: 85.3 fL (ref 80.0–100.0)
PLATELETS: 182 10*3/uL (ref 150–440)
RBC: 4.04 MIL/uL (ref 3.80–5.20)
RDW: 13.8 % (ref 11.5–14.5)
WBC: 7.5 10*3/uL (ref 3.6–11.0)

## 2017-08-03 NOTE — ED Triage Notes (Signed)
Patient states she has been having nausea followed by diarrhea for the last week starting around 6pm to midnight daily.  She reports intermittent abdominal pain but denies having any now.  She stated she is "just tired of feeling this way and wants to know what's going on"  She states she has been taking Pepto Bismol at home but it seems to not work any more.

## 2017-08-03 NOTE — ED Notes (Signed)
Patient stated she just went to bathroom before triage and is unable to go now.

## 2017-08-04 LAB — URINALYSIS, COMPLETE (UACMP) WITH MICROSCOPIC
BILIRUBIN URINE: NEGATIVE
Glucose, UA: NEGATIVE mg/dL
Hgb urine dipstick: NEGATIVE
Ketones, ur: 5 mg/dL — AB
LEUKOCYTES UA: NEGATIVE
Nitrite: POSITIVE — AB
PH: 5 (ref 5.0–8.0)
Protein, ur: NEGATIVE mg/dL
SPECIFIC GRAVITY, URINE: 1.013 (ref 1.005–1.030)

## 2017-08-04 LAB — WET PREP, GENITAL
Clue Cells Wet Prep HPF POC: NONE SEEN
SPERM: NONE SEEN
Trich, Wet Prep: NONE SEEN
Yeast Wet Prep HPF POC: NONE SEEN

## 2017-08-04 LAB — CHLAMYDIA/NGC RT PCR (ARMC ONLY)
Chlamydia Tr: NOT DETECTED
N gonorrhoeae: NOT DETECTED

## 2017-08-04 LAB — PREGNANCY, URINE: PREG TEST UR: NEGATIVE

## 2017-08-04 LAB — POCT PREGNANCY, URINE: PREG TEST UR: NEGATIVE

## 2017-08-04 MED ORDER — ONDANSETRON 4 MG PO TBDP: 4.0000 mg | ORAL_TABLET | Freq: Three times a day (TID) | ORAL | 0 refills | Status: DC | PRN

## 2017-08-04 MED ORDER — CEPHALEXIN 500 MG PO CAPS
500.0000 mg | ORAL_CAPSULE | Freq: Once | ORAL | Status: AC
  Administered 2017-08-04: 500 mg via ORAL
  Filled 2017-08-04: qty 1

## 2017-08-04 MED ORDER — CEPHALEXIN 500 MG PO CAPS: 500.0000 mg | ORAL_CAPSULE | Freq: Four times a day (QID) | ORAL | 0 refills | Status: AC

## 2017-08-04 MED ORDER — ONDANSETRON 4 MG PO TBDP
4.0000 mg | ORAL_TABLET | Freq: Once | ORAL | Status: AC
  Administered 2017-08-04: 4 mg via ORAL
  Filled 2017-08-04: qty 1

## 2017-08-04 NOTE — Discharge Instructions (Signed)
Please follow up with the acute care clinic. Please return with any worsening symptoms, fever, back pain or any other concerns.

## 2017-08-04 NOTE — ED Provider Notes (Signed)
[facility] Emergency Department Provider Note   ____________________________________________   First MD Initiated Contact with Patient 08/03/17 2343     (approximate)  I have reviewed the triage vital signs and the nursing notes.   HISTORY  Chief Complaint Emesis    HPI Kristen Haney is a 26 y.o. female who comes into the hospital today with some nausea and vomiting. The patient states that she has been nauseous everyday for the last week. It's always run 8:54 PM. Today she states that she vomited. She felt nauseous at 6:30 and got into the shower. She felt hot and lightheaded and then vomited. It looks like the food that she had eaten today as well as some yellow. The patient denies any sick contacts and denies any diarrhea. She's had some sharp pains in her mid lower abdomen. She reports it is above her vagina. The patient denies pain with urination and denies any vaginal discharge. Her last period was July 12 and it was shorter than normal but the patient took a pregnancy test which was negative. The patient rates her pain an 8 out of 10 in intensity currently. She is here today for evaluation. She states that she is sexually active and does not typically use protection with her partner.   Past Medical History:  Diagnosis Date  . Abscesses of both axillae     There are no active problems to display for this patient.   History reviewed. No pertinent surgical history.  Prior to Admission medications   Medication Sig Start Date End Date Taking? Authorizing Provider  cephALEXin (KEFLEX) 500 MG capsule Take 1 capsule (500 mg total) by mouth 4 (four) times daily. 08/04/17 08/14/17  Rebecka ApleyWebster, Chanise Habeck P, MD  naproxen (NAPROSYN) 500 MG tablet Take 1 tablet (500 mg total) by mouth 2 (two) times daily with a meal. Patient not taking: Reported on 03/22/2017 12/20/16   Hagler, Jami L, PA-C  ondansetron (ZOFRAN ODT) 4 MG disintegrating tablet Take 1 tablet (4 mg  total) by mouth every 8 (eight) hours as needed for nausea or vomiting. 08/04/17   Rebecka ApleyWebster, Kya Mayfield P, MD  ondansetron (ZOFRAN) 4 MG tablet Take 1 tablet (4 mg total) by mouth every 8 (eight) hours as needed for nausea or vomiting. 03/22/17   Jeanmarie PlantMcShane, James A, MD  phenazopyridine (PYRIDIUM) 100 MG tablet Take 1 tablet (100 mg total) by mouth 3 (three) times daily as needed for pain (May take 1-2 as needed three times daily). Patient not taking: Reported on 03/22/2017 12/20/16   Hagler, Jami L, PA-C  predniSONE (DELTASONE) 10 MG tablet Take 1 tablet (10 mg total) by mouth daily. 6,5,4,3,2,1 six day taper Patient not taking: Reported on 03/22/2017 03/02/17   Evon SlackGaines, Thomas C, PA-C  pseudoephedrine (SUDAFED) 120 MG 12 hr tablet Take 1 tablet (120 mg total) by mouth 2 (two) times daily as needed for congestion. 07/06/17 07/06/18  Triplett, Rulon Eisenmengerari B, FNP  sulfamethoxazole-trimethoprim (BACTRIM DS,SEPTRA DS) 800-160 MG tablet Take 1 tablet by mouth 2 (two) times daily. Patient not taking: Reported on 03/22/2017 12/20/16   Hagler, Ernestene KielJami L, PA-C    Allergies Patient has no known allergies.  History reviewed. No pertinent family history.  Social History Social History  Substance Use Topics  . Smoking status: Current Every Day Smoker  . Smokeless tobacco: Never Used  . Alcohol use No    Review of Systems  Constitutional: No fever/chills Eyes: No visual changes. ENT: No sore throat. Cardiovascular: Denies chest pain. Respiratory: Denies  shortness of breath. Gastrointestinal: abdominal pain,  nausea,  vomiting.  No diarrhea.  No constipation. Genitourinary: Negative for dysuria. Musculoskeletal: Negative for back pain. Skin: Negative for rash. Neurological: Negative for headaches, focal weakness or numbness.   ____________________________________________   PHYSICAL EXAM:  VITAL SIGNS: ED Triage Vitals  Enc Vitals Group     BP 08/03/17 2137 (!) 112/56     Pulse Rate 08/03/17 2137 73     Resp  08/03/17 2137 16     Temp 08/03/17 2137 98.2 F (36.8 C)     Temp Source 08/03/17 2137 Oral     SpO2 08/03/17 2137 99 %     Weight --      Height --      Head Circumference --      Peak Flow --      Pain Score 08/03/17 2136 0     Pain Loc --      Pain Edu? --      Excl. in GC? --     Constitutional: Alert and oriented. Well appearing and in Mild distress. Eyes: Conjunctivae are normal. PERRL. EOMI. Head: Atraumatic. Nose: No congestion/rhinnorhea. Mouth/Throat: Mucous membranes are moist.  Oropharynx non-erythematous. Cardiovascular: Normal rate, regular rhythm. Grossly normal heart sounds.  Good peripheral circulation. Respiratory: Normal respiratory effort.  No retractions. Lungs CTAB. Gastrointestinal: Soft and nontender. No distention. Positive bowel sounds Genitourinary: Normal external genitalia with no cervical motion tenderness to palpation no adnexal tenderness to palpation, some mild white discharge. No bleeding. Cervix is closed. Musculoskeletal: No lower extremity tenderness nor edema.  . Neurologic:  Normal speech and language.  Skin:  Skin is warm, dry and intact.  Psychiatric: Mood and affect are normal.   ____________________________________________   LABS (all labs ordered are listed, but only abnormal results are displayed)  Labs Reviewed  WET PREP, GENITAL - Abnormal; Notable for the following:       Result Value   WBC, Wet Prep HPF POC MODERATE (*)    All other components within normal limits  COMPREHENSIVE METABOLIC PANEL - Abnormal; Notable for the following:    Calcium 8.7 (*)    Total Protein 6.4 (*)    All other components within normal limits  CBC - Abnormal; Notable for the following:    Hemoglobin 11.8 (*)    HCT 34.5 (*)    All other components within normal limits  URINALYSIS, COMPLETE (UACMP) WITH MICROSCOPIC - Abnormal; Notable for the following:    Color, Urine YELLOW (*)    APPearance CLEAR (*)    Ketones, ur 5 (*)    Nitrite  POSITIVE (*)    Bacteria, UA MANY (*)    Squamous Epithelial / LPF 0-5 (*)    All other components within normal limits  CHLAMYDIA/NGC RT PCR ([facility] ONLY)  URINE CULTURE  LIPASE, BLOOD  PREGNANCY, URINE  POCT PREGNANCY, URINE   ____________________________________________  EKG  none ____________________________________________  RADIOLOGY  No results found.  ____________________________________________   PROCEDURES  Procedure(s) performed: None  Procedures  Critical Care performed: No  ____________________________________________   INITIAL IMPRESSION / ASSESSMENT AND PLAN / ED COURSE  Pertinent labs & imaging results that were available during my care of the patient were reviewed by me and considered in my medical decision making (see chart for details).  This is a 26 year old female who comes into the hospital today with nausea and vomiting. The patient also has some suprapubic tenderness to palpation. The patient's blood work was unremarkable but we did  check some urine and it appears that the patient has many bacteria and positive nitrates in her urine. It appears that the patient has urinary tract infection. That would explain the patient's vomiting and her Cipro. Discomfort. I did give the patient a dose of Keflex for her infection. The patient also received a dose of Zofran. She will be discharged home to follow-up with her primary care physician. The patient has no further questions or concerns.      ____________________________________________   FINAL CLINICAL IMPRESSION(S) / ED DIAGNOSES  Final diagnoses:  Non-intractable vomiting with nausea, unspecified vomiting type  Acute cystitis without hematuria      NEW MEDICATIONS STARTED DURING THIS VISIT:  Discharge Medication List as of 08/04/2017  1:41 AM    START taking these medications   Details  cephALEXin (KEFLEX) 500 MG capsule Take 1 capsule (500 mg total) by mouth 4 (four) times  daily., Starting Sun 08/04/2017, Until Wed 08/14/2017, Print    ondansetron (ZOFRAN ODT) 4 MG disintegrating tablet Take 1 tablet (4 mg total) by mouth every 8 (eight) hours as needed for nausea or vomiting., Starting Sun 08/04/2017, Print         Note:  This document was prepared using Dragon voice recognition software and may include unintentional dictation errors.    Rebecka ApleyWebster, Dom Haverland P, MD 08/04/17 [phone]

## 2017-08-06 LAB — URINE CULTURE: Culture: >=100000 — AB

## 2017-08-27 ENCOUNTER — Encounter: Payer: Self-pay | Admitting: Emergency Medicine

## 2017-08-27 ENCOUNTER — Emergency Department
Admission: EM | Admit: 2017-08-27 | Discharge: 2017-08-27 | Disposition: A | Discharge status: Home / Self Care | Payer: Medicaid Other | Attending: Emergency Medicine | Admitting: Emergency Medicine

## 2017-08-27 DIAGNOSIS — B354 Tinea corporis: Secondary | ICD-10-CM | POA: Insufficient documentation

## 2017-08-27 DIAGNOSIS — M654 Radial styloid tenosynovitis [de Quervain]: Secondary | ICD-10-CM | POA: Diagnosis not present

## 2017-08-27 DIAGNOSIS — F17200 Nicotine dependence, unspecified, uncomplicated: Secondary | ICD-10-CM | POA: Diagnosis not present

## 2017-08-27 DIAGNOSIS — M79642 Pain in left hand: Secondary | ICD-10-CM | POA: Diagnosis present

## 2017-08-27 MED ORDER — KETOCONAZOLE 2 % EX CREA: 1.0000 "application " | TOPICAL_CREAM | Freq: Every day | CUTANEOUS | 0 refills | Status: DC

## 2017-08-27 MED ORDER — MELOXICAM 15 MG PO TABS: 15.0000 mg | ORAL_TABLET | Freq: Every day | ORAL | 0 refills | Status: DC

## 2017-08-27 MED ORDER — FLUCONAZOLE 150 MG PO TABS: 150.0000 mg | ORAL_TABLET | ORAL | 0 refills | Status: DC

## 2017-08-27 NOTE — ED Triage Notes (Signed)
C/O left thumb pain x 2 weeks.  Denies injury.

## 2017-08-27 NOTE — ED Provider Notes (Signed)
[facility] Emergency Department Provider Note  ____________________________________________  Time seen: Approximately 3:10 PM  I have reviewed the triage vital signs and the nursing notes.   HISTORY  Chief Complaint Hand Pain    HPI Kristen Haney is a 26 y.o. female who presents emergency department complaining of left thumb pain and "splotches" on bilateral feet and legs. Patient reports that she has been experiencing pain to the MCP joint of the left thumb 2-3 weeks. Patient denies any injury. Patient reports that it is a burning/throbbing/sharp sensation. Patient denies any numbness and tingling in the digit. She has not taken any medications prior to arrival for this complaint. Patient is also endorsing brown splotches to bilateral feet and legs. Patient reports that they've been present for 3-4 months and appeared to be increasing in both size and numerically. Patient denies any pruritus, swelling, edema, erythema 2 lesions. Patient does have a history of tinea capita. No medications for this complaint. No other complaints at this time.   Past Medical History:  Diagnosis Date  . Abscesses of both axillae     There are no active problems to display for this patient.   History reviewed. No pertinent surgical history.  Prior to Admission medications   Medication Sig Start Date End Date Taking? Authorizing Provider  fluconazole (DIFLUCAN) 150 MG tablet Take 1 tablet (150 mg total) by mouth once a week. Take one tablet weekly for 6 weeks 08/27/17   Arriyah Madej, Delorise Royals, PA-C  ketoconazole (NIZORAL) 2 % cream Apply 1 application topically daily. 08/27/17   Braidon Chermak, Delorise Royals, PA-C  meloxicam (MOBIC) 15 MG tablet Take 1 tablet (15 mg total) by mouth daily. 08/27/17   Bryen Hinderman, Delorise Royals, PA-C  ondansetron (ZOFRAN ODT) 4 MG disintegrating tablet Take 1 tablet (4 mg total) by mouth every 8 (eight) hours as needed for nausea or vomiting. 08/04/17   Rebecka Apley, MD  ondansetron (ZOFRAN) 4 MG tablet Take 1 tablet (4 mg total) by mouth every 8 (eight) hours as needed for nausea or vomiting. 03/22/17   Jeanmarie Plant, MD  phenazopyridine (PYRIDIUM) 100 MG tablet Take 1 tablet (100 mg total) by mouth 3 (three) times daily as needed for pain (May take 1-2 as needed three times daily). Patient not taking: Reported on 03/22/2017 12/20/16   Hagler, Jami L, PA-C  predniSONE (DELTASONE) 10 MG tablet Take 1 tablet (10 mg total) by mouth daily. 6,5,4,3,2,1 six day taper Patient not taking: Reported on 03/22/2017 03/02/17   Evon Slack, PA-C  pseudoephedrine (SUDAFED) 120 MG 12 hr tablet Take 1 tablet (120 mg total) by mouth 2 (two) times daily as needed for congestion. 07/06/17 07/06/18  Triplett, Rulon Eisenmenger B, FNP  sulfamethoxazole-trimethoprim (BACTRIM DS,SEPTRA DS) 800-160 MG tablet Take 1 tablet by mouth 2 (two) times daily. Patient not taking: Reported on 03/22/2017 12/20/16   Hagler, Ernestene Kiel, PA-C    Allergies Patient has no known allergies.  No family history on file.  Social History Social History  Substance Use Topics  . Smoking status: Current Every Day Smoker  . Smokeless tobacco: Never Used  . Alcohol use No     Review of Systems  Constitutional: No fever/chills Eyes: No visual changes. No discharge ENT: No upper respiratory complaints. Cardiovascular: no chest pain. Respiratory: no cough. No SOB. Gastrointestinal: No abdominal pain.  Musculoskeletal: Positive for left thumb pain Skin: Positive for brown "blotches" to bilateral legs. Neurological: Negative for headaches, focal weakness or numbness. 10-point ROS otherwise  negative.  ____________________________________________   PHYSICAL EXAM:  VITAL SIGNS: ED Triage Vitals  Enc Vitals Group     BP --      Pulse --      Resp --      Temp --      Temp src --      SpO2 --      Weight 08/27/17 1426 167 lb (75.8 kg)     Height 08/27/17 1426 5\' 8"  (1.727 m)     Head  Circumference --      Peak Flow --      Pain Score 08/27/17 1425 7     Pain Loc --      Pain Edu? --      Excl. in GC? --      Constitutional: Alert and oriented. Well appearing and in no acute distress. Eyes: Conjunctivae are normal. PERRL. EOMI. Head: Atraumatic. Neck: No stridor.    Cardiovascular: Normal rate, regular rhythm. Normal S1 and S2.  Good peripheral circulation. Respiratory: Normal respiratory effort without tachypnea or retractions. Lungs CTAB. Good air entry to the bases with no decreased or absent breath sounds. Musculoskeletal: Full range of motion to all extremities. No gross deformities appreciated.No deformity, edema, erythema noted to left thumb. Full range of motion. Patient is nontender to palpation along the extensor tendon. Full range of motion to the thumb. Cap refill and sensation intact distally. Positive Finkelstein's test. Neurologic:  Normal speech and language. No gross focal neurologic deficits are appreciated.  Skin:  Skin is warm, dry and intact. No rash noted. Dark skin lesions noted in circular pattern to bilateral feet and legs. Lesions are consistent with tinea corporis. Psychiatric: Mood and affect are normal. Speech and behavior are normal. Patient exhibits appropriate insight and judgement.   ____________________________________________   LABS (all labs ordered are listed, but only abnormal results are displayed)  Labs Reviewed - No data to display ____________________________________________  EKG   ____________________________________________  RADIOLOGY   No results found.  ____________________________________________    PROCEDURES  Procedure(s) performed:    Procedures    Medications - No data to display   ____________________________________________   INITIAL IMPRESSION / ASSESSMENT AND PLAN / ED COURSE  Pertinent labs & imaging results that were available during my care of the patient were reviewed by me and  considered in my medical decision making (see chart for details).  Review of the [place] CSRS was performed in accordance of the [facility] prior to dispensing any controlled drugs.     Patient's diagnosis is consistent with de Quervain's tenosynovitis of the left thumb and tinea corporis. Patient will be discharged home with prescriptions for anti-inflammatory for tenosynovitis and antifungals for tinea corporis. Patient is to follow up with primary care as needed or otherwise directed. Patient is given ED precautions to return to the ED for any worsening or new symptoms.     ____________________________________________  FINAL CLINICAL IMPRESSION(S) / ED DIAGNOSES  Final diagnoses:  De Quervain's tenosynovitis, left  Tinea corporis      NEW MEDICATIONS STARTED DURING THIS VISIT:  New Prescriptions   FLUCONAZOLE (DIFLUCAN) 150 MG TABLET    Take 1 tablet (150 mg total) by mouth once a week. Take one tablet weekly for 6 weeks   KETOCONAZOLE (NIZORAL) 2 % CREAM    Apply 1 application topically daily.   MELOXICAM (MOBIC) 15 MG TABLET    Take 1 tablet (15 mg total) by mouth daily.  This chart was dictated using voice recognition software/Dragon. Despite best efforts to proofread, errors can occur which can change the meaning. Any change was purely unintentional.    Racheal Patches, PA-C 08/27/17 1540    Dionne Bucy, MD 08/28/17 [phone]

## 2017-09-23 ENCOUNTER — Emergency Department
Admission: EM | Admit: 2017-09-23 | Discharge: 2017-09-23 | Disposition: A | Discharge status: Home / Self Care | Payer: Medicaid Other | Attending: Emergency Medicine | Admitting: Emergency Medicine

## 2017-09-23 ENCOUNTER — Emergency Department: Payer: Medicaid Other

## 2017-09-23 ENCOUNTER — Encounter: Payer: Self-pay | Admitting: Emergency Medicine

## 2017-09-23 DIAGNOSIS — O208 Other hemorrhage in early pregnancy: Secondary | ICD-10-CM | POA: Diagnosis not present

## 2017-09-23 DIAGNOSIS — F17200 Nicotine dependence, unspecified, uncomplicated: Secondary | ICD-10-CM | POA: Diagnosis not present

## 2017-09-23 DIAGNOSIS — R109 Unspecified abdominal pain: Secondary | ICD-10-CM

## 2017-09-23 DIAGNOSIS — Z3A01 Less than 8 weeks gestation of pregnancy: Secondary | ICD-10-CM | POA: Diagnosis not present

## 2017-09-23 DIAGNOSIS — Z79899 Other long term (current) drug therapy: Secondary | ICD-10-CM | POA: Diagnosis not present

## 2017-09-23 DIAGNOSIS — O219 Vomiting of pregnancy, unspecified: Secondary | ICD-10-CM | POA: Insufficient documentation

## 2017-09-23 DIAGNOSIS — O99331 Smoking (tobacco) complicating pregnancy, first trimester: Secondary | ICD-10-CM | POA: Insufficient documentation

## 2017-09-23 DIAGNOSIS — O26891 Other specified pregnancy related conditions, first trimester: Secondary | ICD-10-CM | POA: Diagnosis present

## 2017-09-23 DIAGNOSIS — R1030 Lower abdominal pain, unspecified: Secondary | ICD-10-CM | POA: Insufficient documentation

## 2017-09-23 LAB — URINALYSIS, COMPLETE (UACMP) WITH MICROSCOPIC
BILIRUBIN URINE: NEGATIVE
Bacteria, UA: NONE SEEN
GLUCOSE, UA: NEGATIVE mg/dL
HGB URINE DIPSTICK: NEGATIVE
KETONES UR: NEGATIVE mg/dL
LEUKOCYTES UA: NEGATIVE
NITRITE: NEGATIVE
PH: 5 (ref 5.0–8.0)
Protein, ur: NEGATIVE mg/dL
SPECIFIC GRAVITY, URINE: 1.012 (ref 1.005–1.030)

## 2017-09-23 LAB — COMPREHENSIVE METABOLIC PANEL
ALT: 12 U/L — AB (ref 14–54)
AST: 16 U/L (ref 15–41)
Albumin: 3.7 g/dL (ref 3.5–5.0)
Alkaline Phosphatase: 44 U/L (ref 38–126)
Anion gap: 5 (ref 5–15)
BILIRUBIN TOTAL: 0.5 mg/dL (ref 0.3–1.2)
BUN: 6 mg/dL (ref 6–20)
CALCIUM: 8.6 mg/dL — AB (ref 8.9–10.3)
CHLORIDE: 104 mmol/L (ref 101–111)
CO2: 26 mmol/L (ref 22–32)
CREATININE: 0.53 mg/dL (ref 0.44–1.00)
Glucose, Bld: 95 mg/dL (ref 65–99)
Potassium: 3.8 mmol/L (ref 3.5–5.1)
Sodium: 135 mmol/L (ref 135–145)
TOTAL PROTEIN: 7 g/dL (ref 6.5–8.1)

## 2017-09-23 LAB — CBC
HCT: 34.4 % — ABNORMAL LOW (ref 35.0–47.0)
Hemoglobin: 11.9 g/dL — ABNORMAL LOW (ref 12.0–16.0)
MCH: 29.6 pg (ref 26.0–34.0)
MCHC: 34.7 g/dL (ref 32.0–36.0)
MCV: 85.2 fL (ref 80.0–100.0)
PLATELETS: 220 10*3/uL (ref 150–440)
RBC: 4.04 MIL/uL (ref 3.80–5.20)
RDW: 13.8 % (ref 11.5–14.5)
WBC: 5.3 10*3/uL (ref 3.6–11.0)

## 2017-09-23 LAB — HCG, QUANTITATIVE, PREGNANCY: HCG, BETA CHAIN, QUANT, S: 59603 m[IU]/mL — AB (ref <5)

## 2017-09-23 LAB — POCT PREGNANCY, URINE: Preg Test, Ur: POSITIVE — AB

## 2017-09-23 LAB — LIPASE, BLOOD: LIPASE: 19 U/L (ref 11–51)

## 2017-09-23 NOTE — ED Provider Notes (Signed)
[facility] Emergency Department Provider Note  Time seen: 9:06 AM  I have reviewed the triage vital signs and the nursing notes.   HISTORY  Chief Complaint Abdominal Cramping and Abdominal Pain    HPI Kristen Haney is a 26 y.o. female G3 P 1 A1 who presents to the emergency department for lower abdominal discomfort nausea and vomiting. According to the patient for the past 3 days she has been experiencing intermittent nausea with occasional episodes of vomiting/dry heaves. Also states for the past 3 days she's had intermittent lower abdominal discomfort/pain. States mild pain currently across the entire lower abdomen. Denies diarrhea. Denies vaginal discharge or bleeding. Denies dysuria. Patient states her last menstrual period was 08/06/17. Denies any fever.  Past Medical History:  Diagnosis Date  . Abscesses of both axillae     There are no active problems to display for this patient.   History reviewed. No pertinent surgical history.  Prior to Admission medications   Medication Sig Start Date End Date Taking? Authorizing Provider  fluconazole (DIFLUCAN) 150 MG tablet Take 1 tablet (150 mg total) by mouth once a week. Take one tablet weekly for 6 weeks 08/27/17   Cuthriell, Delorise Royals, PA-C  ketoconazole (NIZORAL) 2 % cream Apply 1 application topically daily. 08/27/17   Cuthriell, Delorise Royals, PA-C  meloxicam (MOBIC) 15 MG tablet Take 1 tablet (15 mg total) by mouth daily. 08/27/17   Cuthriell, Delorise Royals, PA-C  ondansetron (ZOFRAN ODT) 4 MG disintegrating tablet Take 1 tablet (4 mg total) by mouth every 8 (eight) hours as needed for nausea or vomiting. 08/04/17   Rebecka Apley, MD  ondansetron (ZOFRAN) 4 MG tablet Take 1 tablet (4 mg total) by mouth every 8 (eight) hours as needed for nausea or vomiting. 03/22/17   Jeanmarie Plant, MD  phenazopyridine (PYRIDIUM) 100 MG tablet Take 1 tablet (100 mg total) by mouth 3 (three) times daily as needed for pain (May  take 1-2 as needed three times daily). Patient not taking: Reported on 03/22/2017 12/20/16   Hagler, Jami L, PA-C  predniSONE (DELTASONE) 10 MG tablet Take 1 tablet (10 mg total) by mouth daily. 6,5,4,3,2,1 six day taper Patient not taking: Reported on 03/22/2017 03/02/17   Evon Slack, PA-C  pseudoephedrine (SUDAFED) 120 MG 12 hr tablet Take 1 tablet (120 mg total) by mouth 2 (two) times daily as needed for congestion. 07/06/17 07/06/18  Triplett, Rulon Eisenmenger B, FNP  sulfamethoxazole-trimethoprim (BACTRIM DS,SEPTRA DS) 800-160 MG tablet Take 1 tablet by mouth 2 (two) times daily. Patient not taking: Reported on 03/22/2017 12/20/16   Hagler, Jami L, PA-C    No Known Allergies  No family history on file.  Social History Social History  Substance Use Topics  . Smoking status: Current Every Day Smoker  . Smokeless tobacco: Never Used  . Alcohol use No    Review of Systems Constitutional: Negative for fever. Cardiovascular: Negative for chest pain. Respiratory: Negative for shortness of breath. Gastrointestinal: Mild lower abdominal pain/cramping. Positive for nausea/vomiting. Negative for diarrhea Genitourinary: Negative for dysuria. Negative for vaginal bleeding or discharge. Neurological: Negative for headache All other ROS negative  ____________________________________________   PHYSICAL EXAM:  VITAL SIGNS: ED Triage Vitals  Enc Vitals Group     BP 09/23/17 0821 106/60     Pulse Rate 09/23/17 0821 80     Resp 09/23/17 0821 14     Temp 09/23/17 0821 98 F (36.7 C)     Temp Source 09/23/17 4098  Oral     SpO2 09/23/17 0821 100 %     Weight 09/23/17 0822 168 lb (76.2 kg)     Height 09/23/17 0822  (1.727 m)     Head Circumference --      Peak Flow --      Pain Score 09/23/17 0821 10     Pain Loc --      Pain Edu? --      Excl. in GC? --     Constitutional: Alert and oriented. Well appearing and in no distress. Eyes: Normal exam ENT   Head: Normocephalic and  atraumatic.   Mouth/Throat: Mucous membranes are moist. Cardiovascular: Normal rate, regular rhythm. No murmur Respiratory: Normal respiratory effort without tachypnea nor retractions. Breath sounds are clear  Gastrointestinal: Soft, slight suprapubic tenderness, no rebound or guarding. No distention. Musculoskeletal: Nontender with normal range of motion in all extremities. Neurologic:  Normal speech and language. No gross focal neurologic deficits  Skin:  Skin is warm, dry and intact.  Psychiatric: Mood and affect are normal.   ____________________________________________   RADIOLOGY  Ultrasound shows single live IUP at 6 weeks 3 days with subchorionic hemorrhage. ____________________________________________   INITIAL IMPRESSION / ASSESSMENT AND PLAN / ED COURSE  Pertinent labs & imaging results that were available during my care of the patient were reviewed by me and considered in my medical decision making (see chart for details).  Patient presents to the emergency department for intermittent lower abdominal discomfort with intermittent nausea and vomiting. Overall the patient appears very well, no distress light suprapubic tenderness on exam, otherwise benign exam. At this time the differential is very broad but includes things such as ovarian cyst, ectopic pregnancy, urinary tract infection, appendicitis. We will check labs to help narrow the differential. Overall the patient appears very well with only slight abdominal tenderness.  Patient's urine pregnancy test has resulted as positive. We will confirm with a quantitative beta hCG. We will obtain an ultrasound to further evaluate, and help rule out ectopic pregnancy.  Ultrasound shows single live IUP at 6 weeks 3 days with subchorionic hemorrhage. I discussed the findings with the patient as well as OB follow-up. Patient agreeable to plan. At this time no definitive cause for the patient's abdominal pain/cramping. Labs largely  within normal limits. I discussed return precautions as well as Tylenol as needed for discomfort as written on the box.  ____________________________________________   FINAL CLINICAL IMPRESSION(S) / ED DIAGNOSES  Lower abdominal pain First trimester pregnancy    Minna Antis, MD 09/23/17 1043

## 2017-09-23 NOTE — Discharge Instructions (Signed)
Please follow-up with an OB/GYN in the next 4 weeks for recheck/reevaluation. Please return to the emergency department for any worsening abdominal pain, or any other symptoms personally concerning to yourself.

## 2017-09-23 NOTE — ED Triage Notes (Signed)
Pt here via EMS, pt complains of abdominal pain and vomiting starting Friday, pt reports LMP 8/7, pt unsure if pregnant

## 2017-09-23 NOTE — ED Notes (Signed)
Pt alert and oriented X4, active, cooperative, pt in NAD. RR even and unlabored, color WNL.  Pt informed to return if any life threatening symptoms occur.  Discharge and followup instructions reviewed.  

## 2017-10-05 ENCOUNTER — Encounter: Payer: Self-pay | Admitting: Emergency Medicine

## 2017-10-05 ENCOUNTER — Emergency Department
Admission: EM | Admit: 2017-10-05 | Discharge: 2017-10-05 | Disposition: A | Discharge status: Home / Self Care | Payer: Medicaid Other | Attending: Emergency Medicine | Admitting: Emergency Medicine

## 2017-10-05 DIAGNOSIS — Z3A08 8 weeks gestation of pregnancy: Secondary | ICD-10-CM | POA: Diagnosis not present

## 2017-10-05 DIAGNOSIS — Z79899 Other long term (current) drug therapy: Secondary | ICD-10-CM | POA: Insufficient documentation

## 2017-10-05 DIAGNOSIS — O219 Vomiting of pregnancy, unspecified: Secondary | ICD-10-CM

## 2017-10-05 DIAGNOSIS — Z87891 Personal history of nicotine dependence: Secondary | ICD-10-CM | POA: Diagnosis not present

## 2017-10-05 LAB — CBC
HCT: 32.5 % — ABNORMAL LOW (ref 35.0–47.0)
HEMOGLOBIN: 11.6 g/dL — AB (ref 12.0–16.0)
MCH: 30.2 pg (ref 26.0–34.0)
MCHC: 35.7 g/dL (ref 32.0–36.0)
MCV: 84.5 fL (ref 80.0–100.0)
PLATELETS: 267 10*3/uL (ref 150–440)
RBC: 3.84 MIL/uL (ref 3.80–5.20)
RDW: 13.8 % (ref 11.5–14.5)
WBC: 8.4 10*3/uL (ref 3.6–11.0)

## 2017-10-05 LAB — COMPREHENSIVE METABOLIC PANEL
ALT: 26 U/L (ref 14–54)
ANION GAP: 7 (ref 5–15)
AST: 27 U/L (ref 15–41)
Albumin: 4.3 g/dL (ref 3.5–5.0)
Alkaline Phosphatase: 52 U/L (ref 38–126)
BUN: 13 mg/dL (ref 6–20)
CALCIUM: 9.1 mg/dL (ref 8.9–10.3)
CHLORIDE: 103 mmol/L (ref 101–111)
CO2: 25 mmol/L (ref 22–32)
CREATININE: 0.67 mg/dL (ref 0.44–1.00)
Glucose, Bld: 67 mg/dL (ref 65–99)
Potassium: 4.1 mmol/L (ref 3.5–5.1)
SODIUM: 135 mmol/L (ref 135–145)
Total Bilirubin: 0.4 mg/dL (ref 0.3–1.2)
Total Protein: 8 g/dL (ref 6.5–8.1)

## 2017-10-05 LAB — URINALYSIS, COMPLETE (UACMP) WITH MICROSCOPIC
Bilirubin Urine: NEGATIVE
GLUCOSE, UA: NEGATIVE mg/dL
Ketones, ur: NEGATIVE mg/dL
Nitrite: NEGATIVE
Protein, ur: NEGATIVE mg/dL
Specific Gravity, Urine: 1.014 (ref 1.005–1.030)
pH: 6 (ref 5.0–8.0)

## 2017-10-05 LAB — LIPASE, BLOOD: LIPASE: 21 U/L (ref 11–51)

## 2017-10-05 LAB — HCG, QUANTITATIVE, PREGNANCY: hCG, Beta Chain, Quant, S: 148427 m[IU]/mL — ABNORMAL HIGH (ref <5)

## 2017-10-05 MED ORDER — METOCLOPRAMIDE HCL 10 MG PO TABS: 10.0000 mg | ORAL_TABLET | Freq: Three times a day (TID) | ORAL | 0 refills | Status: DC | PRN

## 2017-10-05 MED ORDER — SODIUM CHLORIDE 0.9 % IV BOLUS (SEPSIS)
1000.0000 mL | Freq: Once | INTRAVENOUS | Status: AC
  Administered 2017-10-05: 1000 mL via INTRAVENOUS

## 2017-10-05 MED ORDER — METOCLOPRAMIDE HCL 5 MG/ML IJ SOLN
10.0000 mg | Freq: Once | INTRAMUSCULAR | Status: AC
  Administered 2017-10-05: 10 mg via INTRAVENOUS
  Filled 2017-10-05: qty 2

## 2017-10-05 NOTE — Discharge Instructions (Signed)
Please seek medical attention for any high fevers, chest pain, shortness of breath, change in behavior, persistent vomiting, bloody stool or any other new or concerning symptoms.  

## 2017-10-05 NOTE — ED Provider Notes (Signed)
[facility] Emergency Department Provider Note   ____________________________________________   I have reviewed the triage vital signs and the nursing notes.   HISTORY  Chief Complaint Emesis and Abdominal Pain   History limited by: Not Limited   HPI Kristen Haney is a 26 y.o. female who presents to the emergency department today because of concerns for nausea and vomiting. Patient is roughly [redacted] weeks pregnant. She states that for the past 2 weeks she has had continuous nausea and vomiting. She has not been able to keep much food down. She is concerned she is dehydrated. She has had some accompanying lower abdominal pressure. She denies any abnormal vaginal bleeding or discharge. She has been taking prenatal vitamins. She has been to the health department. She denies any fevers.   Past Medical History:  Diagnosis Date  . Abscesses of both axillae     There are no active problems to display for this patient.   History reviewed. No pertinent surgical history.  Prior to Admission medications   Medication Sig Start Date End Date Taking? Authorizing Provider  fluconazole (DIFLUCAN) 150 MG tablet Take 1 tablet (150 mg total) by mouth once a week. Take one tablet weekly for 6 weeks 08/27/17   Cuthriell, Delorise Royals, PA-C  ketoconazole (NIZORAL) 2 % cream Apply 1 application topically daily. 08/27/17   Cuthriell, Delorise Royals, PA-C  meloxicam (MOBIC) 15 MG tablet Take 1 tablet (15 mg total) by mouth daily. 08/27/17   Cuthriell, Delorise Royals, PA-C  ondansetron (ZOFRAN ODT) 4 MG disintegrating tablet Take 1 tablet (4 mg total) by mouth every 8 (eight) hours as needed for nausea or vomiting. 08/04/17   Rebecka Apley, MD  ondansetron (ZOFRAN) 4 MG tablet Take 1 tablet (4 mg total) by mouth every 8 (eight) hours as needed for nausea or vomiting. 03/22/17   Jeanmarie Plant, MD  phenazopyridine (PYRIDIUM) 100 MG tablet Take 1 tablet (100 mg total) by mouth 3 (three) times  daily as needed for pain (May take 1-2 as needed three times daily). Patient not taking: Reported on 03/22/2017 12/20/16   Hagler, Jami L, PA-C  predniSONE (DELTASONE) 10 MG tablet Take 1 tablet (10 mg total) by mouth daily. 6,5,4,3,2,1 six day taper Patient not taking: Reported on 03/22/2017 03/02/17   Evon Slack, PA-C  pseudoephedrine (SUDAFED) 120 MG 12 hr tablet Take 1 tablet (120 mg total) by mouth 2 (two) times daily as needed for congestion. 07/06/17 07/06/18  Triplett, Rulon Eisenmenger B, FNP  sulfamethoxazole-trimethoprim (BACTRIM DS,SEPTRA DS) 800-160 MG tablet Take 1 tablet by mouth 2 (two) times daily. Patient not taking: Reported on 03/22/2017 12/20/16   Hagler, Ernestene Kiel, PA-C    Allergies Patient has no known allergies.  No family history on file.  Social History Social History  Substance Use Topics  . Smoking status: Former Games developer  . Smokeless tobacco: Never Used  . Alcohol use No    Review of Systems Constitutional: No fever/chills Eyes: No visual changes. ENT: No sore throat. Cardiovascular: Denies chest pain. Respiratory: Denies shortness of breath. Gastrointestinal: Positive for nausea and vomiting.  Genitourinary: Negative for dysuria. Musculoskeletal: Negative for back pain. Skin: Negative for rash. Neurological: Negative for headaches, focal weakness or numbness.  ____________________________________________   PHYSICAL EXAM:  VITAL SIGNS: ED Triage Vitals  Enc Vitals Group     BP 10/05/17 1529 122/87     Pulse Rate 10/05/17 1529 92     Resp 10/05/17 1529 20     Temp  10/05/17 1529 98.5 F (36.9 C)     Temp Source 10/05/17 1529 Oral     SpO2 10/05/17 1529 100 %     Weight 10/05/17 1529 170 lb (77.1 kg)     Height 10/05/17 1529  (1.727 m)     Head Circumference --      Peak Flow --      Pain Score 10/05/17 1528 8     Pain Loc --      Pain Edu? --      Excl. in GC? --      Constitutional: Alert and oriented. Well appearing and in no distress. Eyes:  Conjunctivae are normal.  ENT   Head: Normocephalic and atraumatic.   Nose: No congestion/rhinnorhea.   Mouth/Throat: Mucous membranes are moist.   Neck: No stridor. Hematological/Lymphatic/Immunilogical: No cervical lymphadenopathy. Cardiovascular: Normal rate, regular rhythm.  No murmurs, rubs, or gallops. Respiratory: Normal respiratory effort without tachypnea nor retractions. Breath sounds are clear and equal bilaterally. No wheezes/rales/rhonchi. Gastrointestinal: Soft and non tender. No rebound. No guarding.  Genitourinary: Deferred Musculoskeletal: Normal range of motion in all extremities. No lower extremity edema. Neurologic:  Normal speech and language. No gross focal neurologic deficits are appreciated.  Skin:  Skin is warm, dry and intact. No rash noted. Psychiatric: Mood and affect are normal. Speech and behavior are normal. Patient exhibits appropriate insight and judgment.  ____________________________________________    LABS (pertinent positives/negatives)  bhcg [id number] CBC wnl except hgb 11.6, hct 32.5 cmp wnl Lipase 21 UA without concerning findings ____________________________________________   EKG  None  ____________________________________________    RADIOLOGY  None  ____________________________________________   PROCEDURES  Procedures  ____________________________________________   INITIAL IMPRESSION / ASSESSMENT AND PLAN / ED COURSE  Pertinent labs & imaging results that were available during my care of the patient were reviewed by me and considered in my medical decision making (see chart for details).  Patient presented to the emergency department today with concerns for nausea and vomiting in the setting of early pregnancy. Differential includes morning sickness, hepatitis, pancreatitis, Castro enteritis, bowel obstruction among other into entities. On exam her abdomen is benign. Blood work without any concerning  abnormalities of CMP or lipase. CBC without any leukocytosis. Patient's beta hCG is of an appropriate level. UA without signs of infection. Patient felt better after fluids and reglan. ____________________________________________   FINAL CLINICAL IMPRESSION(S) / ED DIAGNOSES  Final diagnoses:  Nausea and vomiting during pregnancy     Note: This dictation was prepared with Dragon dictation. Any transcriptional errors that result from this process are unintentional     Phineas Semen, MD 10/05/17 [phone]

## 2017-10-05 NOTE — ED Triage Notes (Signed)
States [redacted] weeks pregnant with frequent episodes of vomiting during this pregnancy. Vomiting began again today. Also has abdominal pressure. Denies vaginal bleeding or fluid.

## 2017-10-10 ENCOUNTER — Other Ambulatory Visit: Payer: Self-pay | Admitting: Physician Assistant

## 2017-10-10 DIAGNOSIS — Z369 Encounter for antenatal screening, unspecified: Secondary | ICD-10-CM

## 2017-10-18 ENCOUNTER — Ambulatory Visit (INDEPENDENT_AMBULATORY_CARE_PROVIDER_SITE_OTHER): Payer: Medicaid Other | Admitting: Obstetrics and Gynecology

## 2017-10-18 VITALS — BP 116/73 | HR 71 | Ht 68.0 in | Wt 177.2 lb

## 2017-10-18 DIAGNOSIS — Z369 Encounter for antenatal screening, unspecified: Secondary | ICD-10-CM

## 2017-10-18 DIAGNOSIS — Z3481 Encounter for supervision of other normal pregnancy, first trimester: Secondary | ICD-10-CM

## 2017-10-18 NOTE — Patient Instructions (Signed)
Pregnancy and Zika Virus Disease Zika virus disease, or Zika, is an illness that can spread to people from mosquitoes that carry the virus. It may also spread from person to person through infected body fluids. Zika first occurred in [place], but recently it has spread to new areas. The virus occurs in tropical climates. The location of Zika continues to change. Most people who become infected with Zika virus do not develop serious illness. However, Zika may cause birth defects in an unborn baby whose mother is infected with the virus. It may also increase the risk of miscarriage. What are the symptoms of Zika virus disease? In many cases, people who have been infected with Zika virus do not develop any symptoms. If symptoms appear, they usually start about a week after the person is infected. Symptoms are usually mild. They may include:  Fever.  Rash.  Red eyes.  Joint pain.  How does Zika virus disease spread? The main way that Zika virus spreads is through the bite of a certain type of mosquito. Unlike most types of mosquitos, which bite only at night, the type of mosquito that carries Zika virus bites both at night and during the day. Zika virus can also spread through sexual contact, through a blood transfusion, and from a mother to her baby before or during birth. Once you have had Zika virus disease, it is unlikely that you will get it again. Can I pass Zika to my baby during pregnancy? Yes, Zika can pass from a mother to her baby before or during birth. What problems can Zika cause for my baby? A woman who is infected with Zika virus while pregnant is at risk of having her baby born with a condition in which the brain or head is smaller than expected (microcephaly). Babies who have microcephaly can have developmental delays, seizures, hearing problems, and vision problems. Having Zika virus disease during pregnancy can also increase the risk of miscarriage. How can Zika virus disease be  prevented? There is no vaccine to prevent Zika. The best way to prevent the disease is to avoid infected mosquitoes and avoid exposure to body fluids that can spread the virus. Avoid any possible exposure to Zika by taking the following precautions. For women and their sex partners:  Avoid traveling to high-risk areas. The locations where Zika is being reported change often. To identify high-risk areas, check the CDC travel website: www.cdc.gov/zika/geo/index.html  If you or your sex partner must travel to a high-risk area, talk with a health care provider before and after traveling.  Take all precautions to avoid mosquito bites if you live in, or travel to, any of the high-risk areas. Insect repellents are safe to use during pregnancy.  Ask your health care provider when it is safe to have sexual contact.  For women:  If you are pregnant or trying to become pregnant, avoid sexual contact with persons who may have been exposed to Zika virus, persons who have possible symptoms of Zika, or persons whose history you are unsure about. If you choose to have sexual contact with someone who may have been exposed to Zika virus, use condoms correctly during the entire duration of sexual activity, every time. Do not share sexual devices, as you may be exposed to body fluids.  Ask your health care provider about when it is safe to attempt pregnancy after a possible exposure to Zika virus.  What steps should I take to avoid mosquito bites? Take these steps to avoid mosquito bites   when you are in a high-risk area:  Wear loose clothing that covers your arms and legs.  Limit your outdoor activities.  Do not open windows unless they have window screens.  Sleep under mosquito nets.  Use insect repellent. The best insect repellents have:  DEET, picaridin, oil of lemon eucalyptus (OLE), or IR3535 in them.  Higher amounts of an active ingredient in them.  Remember that insect repellents are safe to  use during pregnancy.  Do not use OLE on children who are younger than 3 years of age. Do not use insect repellent on babies who are younger than 2 months of age.  Cover your child's stroller with mosquito netting. Make sure the netting fits snugly and that any loose netting does not cover your child's mouth or nose. Do not use a blanket as a mosquito-protection cover.  Do not apply insect repellent underneath clothing.  If you are using sunscreen, apply the sunscreen before applying the insect repellent.  Treat clothing with permethrin. Do not apply permethrin directly to your skin. Follow label directions for safe use.  Get rid of standing water, where mosquitoes may reproduce. Standing water is often found in items such as buckets, bowls, animal food dishes, and flowerpots.  When you return from traveling to any high-risk area, continue taking actions to protect yourself against mosquito bites for 3 weeks, even if you show no signs of illness. This will prevent spreading Zika virus to uninfected mosquitoes. What should I know about the sexual transmission of Zika? People can spread Zika to their sexual partners during vaginal, anal, or oral sex, or by sharing sexual devices. Many people with Zika do not develop symptoms, so a person could spread the disease without knowing that they are infected. The greatest risk is to women who are pregnant or who may become pregnant. Zika virus can live longer in semen than it can live in blood. Couples can prevent sexual transmission of the virus by:  Using condoms correctly during the entire duration of sexual activity, every time. This includes vaginal, anal, and oral sex.  Not sharing sexual devices. Sharing increases your risk of being exposed to body fluid from another person.  Avoiding all sexual activity until your health care provider says it is safe.  Should I be tested for Zika virus? A sample of your blood can be tested for Zika virus. A  pregnant woman should be tested if she may have been exposed to the virus or if she has symptoms of Zika. She may also have additional tests done during her pregnancy, such ultrasound testing. Talk with your health care provider about which tests are recommended. This information is not intended to replace advice given to you by your health care provider. Make sure you discuss any questions you have with your health care provider. Document Released: 09/07/2015 Document Revised: 05/24/2016 Document Reviewed: 08/31/2015 Elsevier Interactive Patient Education  2018 [employer] Hyperemesis Gravidarum Hyperemesis gravidarum is a severe form of nausea and vomiting that happens during pregnancy. Hyperemesis is worse than morning sickness. It may cause you to have nausea or vomiting all day for many days. It may keep you from eating and drinking enough food and liquids. Hyperemesis usually occurs during the first half (the first 20 weeks) of pregnancy. It often goes away once a woman is in her second half of pregnancy. However, sometimes hyperemesis continues through an entire pregnancy. What are the causes? The cause of this condition is not known. It may be related   to changes in chemicals (hormones) in the body during pregnancy, such as the high level of pregnancy hormone (human chorionic gonadotropin) or the increase in the female sex hormone (estrogen). What are the signs or symptoms? Symptoms of this condition include:  Severe nausea and vomiting.  Nausea that does not go away.  Vomiting that does not allow you to keep any food down.  Weight loss.  Body fluid loss (dehydration).  Having no desire to eat, or not liking food that you have previously enjoyed.  How is this diagnosed? This condition may be diagnosed based on:  A physical exam.  Your medical history.  Your symptoms.  Blood tests.  Urine tests.  How is this treated? This condition may be managed with medicine. If  medicines to do not help relieve nausea and vomiting, you may need to receive fluids through an IV tube at the hospital. Follow these instructions at home:  Take over-the-counter and prescription medicines only as told by your health care provider.  Avoid iron pills and multivitamins that contain iron for the first 3-4 months of pregnancy. If you take prescription iron pills, do not stop taking them unless your health care provider approves.  Take the following actions to help prevent nausea and vomiting: ? In the morning, before getting out of bed, try eating a couple of dry crackers or a piece of toast. ? Avoid foods and smells that upset your stomach. Fatty and spicy foods may make nausea worse. ? Eat 5-6 small meals a day. ? Do not drink fluids while eating meals. Drink between meals. ? Eat or suck on things that have ginger in them. Ginger can help relieve nausea. ? Avoid food preparation. The smell of food can spoil your appetite or trigger nausea.  Follow instructions from your health care provider about eating or drinking restrictions.  For snacks, eat high-protein foods, such as cheese.  Keep all follow-up and pre-birth (prenatal) visits as told by your health care provider. This is important. Contact a health care provider if:  You have pain in your abdomen.  You have a severe headache.  You have vision problems.  You are losing weight. Get help right away if:  You cannot drink fluids without vomiting.  You vomit blood.  You have constant nausea and vomiting.  You are very weak.  You are very thirsty.  You feel dizzy.  You faint.  You have a fever or other symptoms that last for more than 2-3 days.  You have a fever and your symptoms suddenly get worse. Summary  Hyperemesis gravidarum is a severe form of nausea and vomiting that happens during pregnancy.  Making some changes to your eating habits may help relieve nausea and vomiting.  This condition may  be managed with medicine.  If medicines to do not help relieve nausea and vomiting, you may need to receive fluids through an IV tube at the hospital. This information is not intended to replace advice given to you by your health care provider. Make sure you discuss any questions you have with your health care provider. Document Released: 12/17/2005 Document Revised: 08/15/2016 Document Reviewed: 08/15/2016 Elsevier Interactive Patient Education  2017 [employer] First Trimester of Pregnancy The first trimester of pregnancy is from week 1 until the end of week 13 (months 1 through 3). During this time, your baby will begin to develop inside you. At 6-8 weeks, the eyes and face are formed, and the heartbeat can be seen on ultrasound. At the   end of 12 weeks, all the baby's organs are formed. Prenatal care is all the medical care you receive before the birth of your baby. Make sure you get good prenatal care and follow all of your doctor's instructions. Follow these instructions at home: Medicines  Take over-the-counter and prescription medicines only as told by your doctor. Some medicines are safe and some medicines are not safe during pregnancy.  Take a prenatal vitamin that contains at least 600 micrograms (mcg) of folic acid.  If you have trouble pooping (constipation), take medicine that will make your stool soft (stool softener) if your doctor approves. Eating and drinking  Eat regular, healthy meals.  Your doctor will tell you the amount of weight gain that is right for you.  Avoid raw meat and uncooked cheese.  If you feel sick to your stomach (nauseous) or throw up (vomit): ? Eat 4 or 5 small meals a day instead of 3 large meals. ? Try eating a few soda crackers. ? Drink liquids between meals instead of during meals.  To prevent constipation: ? Eat foods that are high in fiber, like fresh fruits and vegetables, whole grains, and beans. ? Drink enough fluids to keep your pee  (urine) clear or pale yellow. Activity  Exercise only as told by your doctor. Stop exercising if you have cramps or pain in your lower belly (abdomen) or low back.  Do not exercise if it is too hot, too humid, or if you are in a place of great height (high altitude).  Try to avoid standing for long periods of time. Move your legs often if you must stand in one place for a long time.  Avoid heavy lifting.  Wear low-heeled shoes. Sit and stand up straight.  You can have sex unless your doctor tells you not to. Relieving pain and discomfort  Wear a good support bra if your breasts are sore.  Take warm water baths (sitz baths) to soothe pain or discomfort caused by hemorrhoids. Use hemorrhoid cream if your doctor says it is okay.  Rest with your legs raised if you have leg cramps or low back pain.  If you have puffy, bulging veins (varicose veins) in your legs: ? Wear support hose or compression stockings as told by your doctor. ? Raise (elevate) your feet for 15 minutes, 3-4 times a day. ? Limit salt in your food. Prenatal care  Schedule your prenatal visits by the twelfth week of pregnancy.  Write down your questions. Take them to your prenatal visits.  Keep all your prenatal visits as told by your doctor. This is important. Safety  Wear your seat belt at all times when driving.  Make a list of emergency phone numbers. The list should include numbers for family, friends, the hospital, and police and fire departments. General instructions  Ask your doctor for a referral to a local prenatal class. Begin classes no later than at the start of month 6 of your pregnancy.  Ask for help if you need counseling or if you need help with nutrition. Your doctor can give you advice or tell you where to go for help.  Do not use hot tubs, steam rooms, or saunas.  Do not douche or use tampons or scented sanitary pads.  Do not cross your legs for long periods of time.  Avoid all herbs  and alcohol. Avoid drugs that are not approved by your doctor.  Do not use any tobacco products, including cigarettes, chewing tobacco, and electronic cigarettes.   If you need help quitting, ask your doctor. You may get counseling or other support to help you quit.  Avoid cat litter boxes and soil used by cats. These carry germs that can cause birth defects in the baby and can cause a loss of your baby (miscarriage) or stillbirth.  Visit your dentist. At home, brush your teeth with a soft toothbrush. Be gentle when you floss. Contact a doctor if:  You are dizzy.  You have mild cramps or pressure in your lower belly.  You have a nagging pain in your belly area.  You continue to feel sick to your stomach, you throw up, or you have watery poop (diarrhea).  You have a bad smelling fluid coming from your vagina.  You have pain when you pee (urinate).  You have increased puffiness (swelling) in your face, hands, legs, or ankles. Get help right away if:  You have a fever.  You are leaking fluid from your vagina.  You have spotting or bleeding from your vagina.  You have very bad belly cramping or pain.  You gain or lose weight rapidly.  You throw up blood. It may look like coffee grounds.  You are around people who have German measles, fifth disease, or chickenpox.  You have a very bad headache.  You have shortness of breath.  You have any kind of trauma, such as from a fall or a car accident. Summary  The first trimester of pregnancy is from week 1 until the end of week 13 (months 1 through 3).  To take care of yourself and your unborn baby, you will need to eat healthy meals, take medicines only if your doctor tells you to do so, and do activities that are safe for you and your baby.  Keep all follow-up visits as told by your doctor. This is important as your doctor will have to ensure that your baby is healthy and growing well. This information is not intended to replace  advice given to you by your health care provider. Make sure you discuss any questions you have with your health care provider. Document Released: 06/04/2008 Document Revised: 12/25/2016 Document Reviewed: 12/25/2016 Elsevier Interactive Patient Education  2017 [employer] Commonly Asked Questions During Pregnancy  Cats: A parasite can be excreted in cat feces.  To avoid exposure you need to have another person empty the little box.  If you must empty the litter box you will need to wear gloves.  Wash your hands after handling your cat.  This parasite can also be found in raw or undercooked meat so this should also be avoided.  Colds, Sore Throats, Flu: Please check your medication sheet to see what you can take for symptoms.  If your symptoms are unrelieved by these medications please call the office.  Dental Work: Most any dental work your dentist recommends is permitted.  X-rays should only be taken during the first trimester if absolutely necessary.  Your abdomen should be shielded with a lead apron during all x-rays.  Please notify your provider prior to receiving any x-rays.  Novocaine is fine; gas is not recommended.  If your dentist requires a note from us prior to dental work please call the office and we will provide one for you.  Exercise: Exercise is an important part of staying healthy during your pregnancy.  You may continue most exercises you were accustomed to prior to pregnancy.  Later in your pregnancy you will most likely notice you have difficulty with activities   requiring balance like riding a bicycle.  It is important that you listen to your body and avoid activities that put you at a higher risk of falling.  Adequate rest and staying well hydrated are a must!  If you have questions about the safety of specific activities ask your provider.    Exposure to Children with illness: Try to avoid obvious exposure; report any symptoms to us when noted,  If you have chicken pos, red  measles or mumps, you should be immune to these diseases.   Please do not take any vaccines while pregnant unless you have checked with your OB provider.  Fetal Movement: After 28 weeks we recommend you do "kick counts" twice daily.  Lie or sit down in a calm quiet environment and count your baby movements "kicks".  You should feel your baby at least 10 times per hour.  If you have not felt 10 kicks within the first hour get up, walk around and have something sweet to eat or drink then repeat for an additional hour.  If count remains less than 10 per hour notify your provider.  Fumigating: Follow your pest control agent's advice as to how long to stay out of your home.  Ventilate the area well before re-entering.  Hemorrhoids:   Most over-the-counter preparations can be used during pregnancy.  Check your medication to see what is safe to use.  It is important to use a stool softener or fiber in your diet and to drink lots of liquids.  If hemorrhoids seem to be getting worse please call the office.   Hot Tubs:  Hot tubs Jacuzzis and saunas are not recommended while pregnant.  These increase your internal body temperature and should be avoided.  Intercourse:  Sexual intercourse is safe during pregnancy as long as you are comfortable, unless otherwise advised by your provider.  Spotting may occur after intercourse; report any bright red bleeding that is heavier than spotting.  Labor:  If you know that you are in labor, please go to the hospital.  If you are unsure, please call the office and let us help you decide what to do.  Lifting, straining, etc:  If your job requires heavy lifting or straining please check with your provider for any limitations.  Generally, you should not lift items heavier than that you can lift simply with your hands and arms (no back muscles)  Painting:  Paint fumes do not harm your pregnancy, but may make you ill and should be avoided if possible.  Latex or water based paints  have less odor than oils.  Use adequate ventilation while painting.  Permanents & Hair Color:  Chemicals in hair dyes are not recommended as they cause increase hair dryness which can increase hair loss during pregnancy.  " Highlighting" and permanents are allowed.  Dye may be absorbed differently and permanents may not hold as well during pregnancy.  Sunbathing:  Use a sunscreen, as skin burns easily during pregnancy.  Drink plenty of fluids; avoid over heating.  Tanning Beds:  Because their possible side effects are still unknown, tanning beds are not recommended.  Ultrasound Scans:  Routine ultrasounds are performed at approximately 20 weeks.  You will be able to see your baby's general anatomy an if you would like to know the gender this can usually be determined as well.  If it is questionable when you conceived you may also receive an ultrasound early in your pregnancy for dating purposes.  Otherwise ultrasound exams   are not routinely performed unless there is a medical necessity.  Although you can request a scan we ask that you pay for it when conducted because insurance does not cover " patient request" scans.  Work: If your pregnancy proceeds without complications you may work until your due date, unless your physician or employer advises otherwise.  Round Ligament Pain/Pelvic Discomfort:  Sharp, shooting pains not associated with bleeding are fairly common, usually occurring in the second trimester of pregnancy.  They tend to be worse when standing up or when you remain standing for long periods of time.  These are the result of pressure of certain pelvic ligaments called "round ligaments".  Rest, Tylenol and heat seem to be the most effective relief.  As the womb and fetus grow, they rise out of the pelvis and the discomfort improves.  Please notify the office if your pain seems different than that described.  It may represent a more serious condition.   

## 2017-10-18 NOTE — Progress Notes (Signed)
Kristen Haney presents for NOB nurse interview visit. Pregnancy confirmation done on 09/23/17. UPT-positive. Ultrasound done also EGA: 6.3wks, EDD: 05/16/2018.  Was seen for n/v. This is somewhat improved.  G- 3  [id number].  Pregnancy education material explained and given. No cats in the home. NOB labs not ordered. Pt states she had nob labs done at [facility]. To sign medical record release form.  Drug screen declined. PNV encouraged. Genetic screening options discussed. Genetic testing: Ordered.  NT ordered. [facility] because at the time pt did not have her insurance coverage. Pt. To follow up with provider in on 10/23/2017 with Dr. Logan BoresEvans for NOB physical and NT scheduled also at this time.  All questions answered.

## 2017-10-23 ENCOUNTER — Ambulatory Visit (INDEPENDENT_AMBULATORY_CARE_PROVIDER_SITE_OTHER): Payer: Medicaid Other | Admitting: Obstetrics and Gynecology

## 2017-10-23 ENCOUNTER — Encounter: Payer: Self-pay | Admitting: Obstetrics and Gynecology

## 2017-10-23 ENCOUNTER — Other Ambulatory Visit: Payer: Self-pay

## 2017-10-23 VITALS — BP 100/64 | HR 69 | Wt 179.2 lb

## 2017-10-23 DIAGNOSIS — D573 Sickle-cell trait: Secondary | ICD-10-CM

## 2017-10-23 DIAGNOSIS — O418X10 Other specified disorders of amniotic fluid and membranes, first trimester, not applicable or unspecified: Secondary | ICD-10-CM

## 2017-10-23 DIAGNOSIS — O219 Vomiting of pregnancy, unspecified: Secondary | ICD-10-CM

## 2017-10-23 DIAGNOSIS — O468X1 Other antepartum hemorrhage, first trimester: Secondary | ICD-10-CM

## 2017-10-23 DIAGNOSIS — Z3481 Encounter for supervision of other normal pregnancy, first trimester: Secondary | ICD-10-CM

## 2017-10-23 LAB — POCT URINALYSIS DIPSTICK
BILIRUBIN UA: NEGATIVE
Blood, UA: NEGATIVE
Glucose, UA: NEGATIVE
Ketones, UA: NEGATIVE
LEUKOCYTES UA: NEGATIVE
Nitrite, UA: NEGATIVE
Protein, UA: NEGATIVE
Spec Grav, UA: 1.01 (ref 1.010–1.025)
Urobilinogen, UA: 0.2 E.U./dL
pH, UA: 5 (ref 5.0–8.0)

## 2017-10-23 NOTE — Addendum Note (Signed)
Addended by: [name] DareSICK, Doyce Saling L on: 10/23/2017 10:51 AM   Modules accepted: Orders

## 2017-10-23 NOTE — Progress Notes (Signed)
HPI:      Ms. Kristen Haney is a 26 y.o. G3P1011 who LMP was Patient's last menstrual period was 08/06/2017 (exact date).  Subjective:   She presents today as a transfer from [facility] department.  She states she had a full physical exam Pap smear and cultures performed there.  She does not want them repeated here. (We have sent a request for records.) Patient has a history of sickle cell trait -partner has not been tested. Patient has intermittent nausea and vomiting but keeping most food down.  She states that she was sick her entire pregnancy last time. Previous ultrasound reveals a subchorionic hemorrhage-patient having no bleeding whatsoever.    Hx: The following portions of the patient's history were reviewed and updated as appropriate:             She  has a past medical history of Abscesses of both axillae; migraines; and UTI (urinary tract infection). She  does not have a problem list on file. She  has a past surgical history that includes NONE. Her family history includes Asthma in her son; Cancer in her maternal aunt; Hypertension in her mother; Stroke in her paternal grandmother. She  reports that she has quit smoking. She has never used smokeless tobacco. She reports that she does not drink alcohol or use drugs. She has No Known Allergies.       Review of Systems:  Review of Systems  Constitutional: Denied constitutional symptoms, night sweats, recent illness, fatigue, fever, insomnia and weight loss.  Eyes: Denied eye symptoms, eye pain, photophobia, vision change and visual disturbance.  Ears/Nose/Throat/Neck: Denied ear, nose, throat or neck symptoms, hearing loss, nasal discharge, sinus congestion and sore throat.  Cardiovascular: Denied cardiovascular symptoms, arrhythmia, chest pain/pressure, edema, exercise intolerance, orthopnea and palpitations.  Respiratory: Denied pulmonary symptoms, asthma, pleuritic pain, productive sputum, cough, dyspnea and wheezing.   Gastrointestinal: Denied, gastro-esophageal reflux, melena, nausea and vomiting.  Genitourinary: Denied genitourinary symptoms including symptomatic vaginal discharge, pelvic relaxation issues, and urinary complaints.  Musculoskeletal: Denied musculoskeletal symptoms, stiffness, swelling, muscle weakness and myalgia.  Dermatologic: Denied dermatology symptoms, rash and scar.  Neurologic: Denied neurology symptoms, dizziness, headache, neck pain and syncope.  Psychiatric: Denied psychiatric symptoms, anxiety and depression.  Endocrine: Denied endocrine symptoms including hot flashes and night sweats.   Meds:   Current Outpatient Prescriptions on File Prior to Visit  Medication Sig Dispense Refill  . metoCLOPramide (REGLAN) 10 MG tablet Take 1 tablet (10 mg total) by mouth every 8 (eight) hours as needed for nausea or vomiting. 15 tablet 0  . Prenatal Vit-Fe Fumarate-FA (PRENATAL 19) 29-1 MG CHEW Chew by mouth.     No current facility-administered medications on file prior to visit.     Objective:     Vitals:   10/23/17 1007  BP: 100/64  Pulse: 69              Patient refused examination  states it was performed at the health department.  Assessment:    G3P1011 There are no active problems to display for this patient.    1. Supervision of normal intrauterine pregnancy in multigravida in first trimester   2. Subchorionic hemorrhage of placenta in first trimester, single or unspecified fetus   3. Nausea and vomiting of pregnancy, antepartum   4. Sickle cell trait (HCC)        Plan:            1.  Patient will continue prenatal vitamins  2.  Discussed testing of partner for sickle cell-declined at this time but will consider.  3.  Maternity 21 today  4.  AFP next visit   Orders Orders Placed This Encounter  Procedures  . POCT urinalysis dipstick    No orders of the defined types were placed in this encounter.     F/U  Return in about 4 weeks (around  11/20/2017).  Elonda Huskyavid J. Nisha Dhami, M.D. 10/23/2017 10:43 AM

## 2017-10-27 LAB — MATERNIT 21 PLUS CORE, BLOOD
CHROMOSOME 13: NEGATIVE
CHROMOSOME 21: NEGATIVE
Chromosome 18: NEGATIVE
Y Chromosome: DETECTED

## 2017-10-30 ENCOUNTER — Encounter: Payer: Self-pay | Admitting: Emergency Medicine

## 2017-10-30 ENCOUNTER — Emergency Department
Admission: EM | Admit: 2017-10-30 | Discharge: 2017-10-30 | Disposition: A | Discharge status: Home / Self Care | Payer: Medicaid Other | Attending: Emergency Medicine | Admitting: Emergency Medicine

## 2017-10-30 DIAGNOSIS — R109 Unspecified abdominal pain: Secondary | ICD-10-CM

## 2017-10-30 DIAGNOSIS — O9989 Other specified diseases and conditions complicating pregnancy, childbirth and the puerperium: Secondary | ICD-10-CM | POA: Insufficient documentation

## 2017-10-30 DIAGNOSIS — R1030 Lower abdominal pain, unspecified: Secondary | ICD-10-CM | POA: Insufficient documentation

## 2017-10-30 DIAGNOSIS — J069 Acute upper respiratory infection, unspecified: Secondary | ICD-10-CM | POA: Diagnosis not present

## 2017-10-30 DIAGNOSIS — Z3A00 Weeks of gestation of pregnancy not specified: Secondary | ICD-10-CM | POA: Diagnosis not present

## 2017-10-30 DIAGNOSIS — Z79899 Other long term (current) drug therapy: Secondary | ICD-10-CM | POA: Insufficient documentation

## 2017-10-30 DIAGNOSIS — O99519 Diseases of the respiratory system complicating pregnancy, unspecified trimester: Secondary | ICD-10-CM | POA: Diagnosis not present

## 2017-10-30 DIAGNOSIS — Z87891 Personal history of nicotine dependence: Secondary | ICD-10-CM | POA: Diagnosis not present

## 2017-10-30 LAB — CBC
HEMATOCRIT: 30.9 % — AB (ref 35.0–47.0)
HEMOGLOBIN: 10.4 g/dL — AB (ref 12.0–16.0)
MCH: 29 pg (ref 26.0–34.0)
MCHC: 33.7 g/dL (ref 32.0–36.0)
MCV: 86.2 fL (ref 80.0–100.0)
Platelets: 232 10*3/uL (ref 150–440)
RBC: 3.58 MIL/uL — ABNORMAL LOW (ref 3.80–5.20)
RDW: 14.2 % (ref 11.5–14.5)
WBC: 6.2 10*3/uL (ref 3.6–11.0)

## 2017-10-30 LAB — URINALYSIS, COMPLETE (UACMP) WITH MICROSCOPIC
BACTERIA UA: NONE SEEN
Bilirubin Urine: NEGATIVE
Glucose, UA: NEGATIVE mg/dL
HGB URINE DIPSTICK: NEGATIVE
Ketones, ur: NEGATIVE mg/dL
Leukocytes, UA: NEGATIVE
NITRITE: NEGATIVE
PROTEIN: NEGATIVE mg/dL
Specific Gravity, Urine: 1.016 (ref 1.005–1.030)
pH: 5 (ref 5.0–8.0)

## 2017-10-30 LAB — COMPREHENSIVE METABOLIC PANEL
ALBUMIN: 3.7 g/dL (ref 3.5–5.0)
ALT: 13 U/L — ABNORMAL LOW (ref 14–54)
ANION GAP: 11 (ref 5–15)
AST: 17 U/L (ref 15–41)
Alkaline Phosphatase: 49 U/L (ref 38–126)
BUN: 11 mg/dL (ref 6–20)
CHLORIDE: 101 mmol/L (ref 101–111)
CO2: 24 mmol/L (ref 22–32)
Calcium: 8.6 mg/dL — ABNORMAL LOW (ref 8.9–10.3)
Creatinine, Ser: 0.55 mg/dL (ref 0.44–1.00)
GFR calc Af Amer: >60 mL/min (ref >60)
GFR calc non Af Amer: >60 mL/min (ref >60)
GLUCOSE: 67 mg/dL (ref 65–99)
POTASSIUM: 3.6 mmol/L (ref 3.5–5.1)
SODIUM: 136 mmol/L (ref 135–145)
Total Bilirubin: 0.4 mg/dL (ref 0.3–1.2)
Total Protein: 7.3 g/dL (ref 6.5–8.1)

## 2017-10-30 LAB — POCT PREGNANCY, URINE: PREG TEST UR: POSITIVE — AB

## 2017-10-30 LAB — LIPASE, BLOOD: Lipase: 21 U/L (ref 11–51)

## 2017-10-30 NOTE — ED Triage Notes (Signed)
Patient presents to ED via POV from home with c/o abdominal cramping. Patient is 11.[redacted] weeks pregnant. Patient denies vaginal bleeding or discharge. Unable to hear fetal heart tones via doppler at this time. Patient reports N/V/D since Sunday.

## 2017-10-30 NOTE — ED Provider Notes (Signed)
[facility] Emergency Department Provider Note  Time seen: 4:47 PM  I have reviewed the triage vital signs and the nursing notes.   HISTORY  Chief Complaint Abdominal Pain    HPI Kristen Haney is a 26 y.o. female with a past medical history of migraines, presents to the emergency department for cough congestion currently pregnant.  Patient states over the past 3-4 days she has not been feeling well with cough, congestion occasional nausea and diarrhea.  States she is having occasional lower abdominal cramping as well although denies vaginal bleeding or discharge.  Denies dysuria.  Denies fever.  Patient has seen OB for her initial visit but returns next week for a more detailed evaluation.  Has not yet had an ultrasound.  No fetal heart tones heard in triage.  Past Medical History:  Diagnosis Date  . Abscesses of both axillae   . Hx of migraines   . Hx: UTI (urinary tract infection)     There are no active problems to display for this patient.   Past Surgical History:  Procedure Laterality Date  . NONE      Prior to Admission medications   Medication Sig Start Date End Date Taking? Authorizing Provider  metoCLOPramide (REGLAN) 10 MG tablet Take 1 tablet (10 mg total) by mouth every 8 (eight) hours as needed for nausea or vomiting. 10/05/17 10/05/18  Phineas Semen, MD  Prenatal Vit-Fe Fumarate-FA (PRENATAL 19) 29-1 MG CHEW Chew by mouth.    [provider]    No Known Allergies  Family History  Problem Relation Age of Onset  . Hypertension Mother   . Cancer Maternal Aunt        PANCREATIC  . Stroke Paternal Grandmother   . Asthma Son     Social History Social History  Substance Use Topics  . Smoking status: Former Games developer  . Smokeless tobacco: Never Used  . Alcohol use No    Review of Systems Constitutional: Negative for fever. Cardiovascular: Negative for chest pain. Respiratory: Negative for shortness of  breath. Gastrointestinal: Occasional lower abdominal cramping.  Positive for nausea.  Occasional diarrhea. Genitourinary: Negative for dysuria.  Negative for vaginal bleeding or discharge Neurological: Negative for headache All other ROS negative  ____________________________________________   PHYSICAL EXAM:  VITAL SIGNS: ED Triage Vitals  Enc Vitals Group     BP 10/30/17 1514 130/79     Pulse Rate 10/30/17 1514 93     Resp 10/30/17 1514 15     Temp 10/30/17 1514 98.8 F (37.1 C)     Temp Source 10/30/17 1514 Oral     SpO2 10/30/17 1514 93 %     Weight 10/30/17 1514 179 lb 4.8 oz (81.3 kg)     Height 10/30/17 1514 5\' 8"  (1.727 m)     Head Circumference --      Peak Flow --      Pain Score 10/30/17 1609 7     Pain Loc --      Pain Edu? --      Excl. in GC? --     Constitutional: Alert and oriented. Well appearing and in no distress. Eyes: Normal exam ENT   Head: Normocephalic and atraumatic.  Moderate rhinorrhea.   Mouth/Throat: Mucous membranes are moist. Cardiovascular: Normal rate, regular rhythm. No murmur Respiratory: Normal respiratory effort without tachypnea nor retractions. Breath sounds are clear  Gastrointestinal: Soft and nontender. No distention. Musculoskeletal: Nontender with normal range of motion in all extremities.  Neurologic:  Normal speech and language. No gross focal neurologic deficits  Skin:  Skin is warm, dry and intact.  Psychiatric: Mood and affect are normal. Speech and behavior are normal.   ____________________________________________   INITIAL IMPRESSION / ASSESSMENT AND PLAN / ED COURSE  Pertinent labs & imaging results that were available during my care of the patient were reviewed by me and considered in my medical decision making (see chart for details).  Patient presents to the emergency department pregnant with occasional lower abdominal cramping, nausea cough and congestion.  Differential would include upper respiratory  infection, gastroenteritis, pregnancy, miscarriage.  Patient's exam is overall reassuring, no abdominal tenderness on exam overall the patient appears very well with moderate rhinorrhea.  Bedside ultrasound performed by myself shows great fetal movement with a fetal heart rate of 167 bpm on M-mode.  Labs are largely within normal limits.  I discussed safe over-the-counter medications for the patient including Tylenol, Benadryl if needed and pseudoephedrine if needed.  Patient will follow up with OB/GYN.  ____________________________________________   FINAL CLINICAL IMPRESSION(S) / ED DIAGNOSES  Upper respiratory infection    Minna AntisPaduchowski, Nabeel Gladson, MD 10/30/17 1650

## 2017-10-30 NOTE — ED Notes (Addendum)
Pt stating "I think I have a stomach bug." Pt stating her sx started Sunday with them worsening yesterday. Pt has nasal congestion, n/v, HA, and cough. Pt stating that her last emesis was this morning. Pt stating diarrhea with 2 BMs today. Pt stating that she has been taking Reglan for her nausea. Pt has had "hot and cold" feeling but no fever. Pt stating that she is 11.[redacted] weeks pregnant.

## 2017-10-31 ENCOUNTER — Ambulatory Visit
Admission: RE | Admit: 2017-10-31 | Discharge: 2017-10-31 | Disposition: A | Discharge status: Home / Self Care | Payer: Self-pay | Source: Ambulatory Visit | Attending: Physician Assistant | Admitting: Physician Assistant

## 2017-10-31 ENCOUNTER — Ambulatory Visit (HOSPITAL_BASED_OUTPATIENT_CLINIC_OR_DEPARTMENT_OTHER)
Admission: RE | Admit: 2017-10-31 | Discharge: 2017-10-31 | Disposition: A | Discharge status: Home / Self Care | Payer: Medicaid Other | Source: Ambulatory Visit | Attending: Maternal & Fetal Medicine | Admitting: Maternal & Fetal Medicine

## 2017-10-31 VITALS — BP 115/57 | HR 72 | Temp 98.3°F | Resp 18 | Wt 180.6 lb

## 2017-10-31 DIAGNOSIS — D573 Sickle-cell trait: Secondary | ICD-10-CM

## 2017-10-31 DIAGNOSIS — Z369 Encounter for antenatal screening, unspecified: Secondary | ICD-10-CM

## 2017-10-31 HISTORY — DX: Other specified health status: Z78.9

## 2017-10-31 NOTE — Progress Notes (Signed)
Referring physician:  [facility] 40 minute consult  Krist[facility] H[facility] for genetic counseling and an ultrasound due to her history of Sickle cell trait.  This letter is a summary of the main issues we addressed during the visit.  In reviewing the family history, Kristen Haney reported she has been diagnosed with sickle cell trait. She also reported one maternal uncle who passed away from sickle cell disease.  Kristen Haney says that her partner, Kristen Haney, reported that he does not have the trait.  However, we do not have documentation of his status.  The remainder of the family history is unremarkable for genetic conditions, birth defects or developmental delays.    We also obtained a detailed pregnancy history.  Kristen Haney reported no complications during the pregnancy and no exposures to medications, recreational drugs or alcohol. She reports stopping smoking cigarettes as soon as she learned that she was pregnant.  We reviewed some general information about sickle cell disease and trait.  Sickle cell anemia is caused by a change in the hemoglobin.  Hemoglobin is the substance in red blood cells that carries oxygen.  When there is a change in the structure of the hemoglobin, there are problems in the way the blood carries oxygen.  One specific change causes the cells to take on a sickle, or half moon, shape instead of the usual round shape.  This is called sickle cell anemia.  People with sickle cell disease are at an increased risk for infections, stroke, damage to certain organs, painful crises and other medical complications.   The changes that can occur in hemoglobin are caused by changes in the genetic instructions, or genes, which tell our bodies how to grow and develop.  We have two copies of all of our genes; one is inherited from the mother and one from the father.  Some diseases are caused when a gene is changed and does not function properly.   Recessive diseases are conditions that are caused when both copies of the gene for a trait do not function properly.  Sickle cell anemia is a recessive condition.  In recessive conditions, a person can have one changed copy of the gene and one normal copy and not have any medical problems.  This is because the normal copy masks the effects of the changed copy.  We call people who have one normal and one changed copy "carriers" for the trait.  Carriers can pass on either the normal copy or the changed copy when they make eggs or sperm.  This means that there is a 50% chance that the child will inherit either the normal copy or the changed copy.  For a child to have the condition both parents must be carriers and both parents must pass on the changed gene to the child.  When both parents are carriers, they have a 1 in 4 (or 25%) chance of having a child with sickle cell disease, a 1 in 2 chance for the child to have sickle cell trait, and a 1 in 4 chance for the child to not inherit any copies of the changed gene for sickle cell.  These risks are for each pregnancy.   In order to determine if a baby is at risk for inheriting sickle cell disease, both parents must be tested for changes in the genes for hemoglobin using a test called hemoglobin electrophoresis.  Review of records on Kristen Haney would confirm that he is not  a carrier, in which case the baby is not at risk for sickle cell disease, or if he is a carrier, would allow you to know the risk to the baby.  If both parents are carriers, then testing of the current pregnancy is available either prior to or after birth.  Testing prior to birth is performed through an amniocentesis.  We discussed how this procedure is performed and the risk of 1 in 200 for miscarriage from this test.  In addition, we discussed that all newborn babies in [place] are tested for changes in the hemoglobin at birth.  After consideration of these options, Kristen Haney declined  carrier testing the father of the baby and expressed that she would not want prenatal testing for sickle cell disease.  She elected to undergo a first trimester ultrasound only.  The ultrasound at the time of this visit confirmed the gestational age to be 12 weeks.   The fetal anatomy could not be seen due to the early gestational age.   We also reviewed screening and testing options for aneuploidy. Prior to this visit, Kristen Haney had MaterniT21 testing ordered through [facility] which was normal for chromosomes 13, 18 and 21 and showed presence of Y chromosome material.  Therefore, first trimester screening with nuchal translucency was not indicated.  Maternal serum AFP only would be recommended in the second trimester to screening for open neural tube defects.  We appreciate the opportunity to be involved in the care of this family.  Kristen Haney was encouraged to contact us with any questions at [phone].    Cherly Anderson, MS, CGC

## 2017-10-31 NOTE — Progress Notes (Signed)
Patient seen by me, agree with above assessment and plan as outlined in [facility]'s note.     

## 2017-11-07 NOTE — Progress Notes (Signed)
Patient seen by me, agree with assessment and plan as outlined in [facility]'s note.  

## 2017-11-12 ENCOUNTER — Encounter: Payer: Medicaid Other | Admitting: Obstetrics and Gynecology

## 2017-11-20 ENCOUNTER — Encounter: Payer: Self-pay | Admitting: Obstetrics and Gynecology

## 2017-11-20 ENCOUNTER — Ambulatory Visit (INDEPENDENT_AMBULATORY_CARE_PROVIDER_SITE_OTHER): Payer: Medicaid Other | Admitting: Obstetrics and Gynecology

## 2017-11-20 VITALS — BP 115/73 | HR 66 | Wt 182.2 lb

## 2017-11-20 DIAGNOSIS — O9989 Other specified diseases and conditions complicating pregnancy, childbirth and the puerperium: Secondary | ICD-10-CM

## 2017-11-20 DIAGNOSIS — F439 Reaction to severe stress, unspecified: Secondary | ICD-10-CM

## 2017-11-20 DIAGNOSIS — Z3482 Encounter for supervision of other normal pregnancy, second trimester: Secondary | ICD-10-CM

## 2017-11-20 DIAGNOSIS — Z23 Encounter for immunization: Secondary | ICD-10-CM

## 2017-11-20 DIAGNOSIS — Z8659 Personal history of other mental and behavioral disorders: Secondary | ICD-10-CM

## 2017-11-20 DIAGNOSIS — O99891 Other specified diseases and conditions complicating pregnancy: Secondary | ICD-10-CM

## 2017-11-20 LAB — POCT URINALYSIS DIPSTICK
BILIRUBIN UA: NEGATIVE
Glucose, UA: NEGATIVE
Ketones, UA: NEGATIVE
Leukocytes, UA: NEGATIVE
NITRITE UA: NEGATIVE
PH UA: 6.5 (ref 5.0–8.0)
Protein, UA: NEGATIVE
RBC UA: NEGATIVE
SPEC GRAV UA: 1.01 (ref 1.010–1.025)
Urobilinogen, UA: 0.2 E.U./dL
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
## 2017-11-20 NOTE — Progress Notes (Signed)
ROB- Pt has been experiencing a lot of stress recently, pt states the discharge she called about has cleared up

## 2017-11-25 ENCOUNTER — Encounter: Payer: Self-pay | Admitting: Emergency Medicine

## 2017-11-25 ENCOUNTER — Emergency Department
Admission: EM | Admit: 2017-11-25 | Discharge: 2017-11-25 | Disposition: A | Discharge status: Home / Self Care | Payer: Medicaid Other | Attending: Emergency Medicine | Admitting: Emergency Medicine

## 2017-11-25 ENCOUNTER — Emergency Department: Payer: Medicaid Other

## 2017-11-25 DIAGNOSIS — O9989 Other specified diseases and conditions complicating pregnancy, childbirth and the puerperium: Secondary | ICD-10-CM | POA: Insufficient documentation

## 2017-11-25 DIAGNOSIS — Z3A16 16 weeks gestation of pregnancy: Secondary | ICD-10-CM | POA: Insufficient documentation

## 2017-11-25 DIAGNOSIS — Z87891 Personal history of nicotine dependence: Secondary | ICD-10-CM | POA: Insufficient documentation

## 2017-11-25 DIAGNOSIS — R102 Pelvic and perineal pain: Secondary | ICD-10-CM

## 2017-11-25 DIAGNOSIS — Z79899 Other long term (current) drug therapy: Secondary | ICD-10-CM | POA: Diagnosis not present

## 2017-11-25 DIAGNOSIS — M545 Low back pain, unspecified: Secondary | ICD-10-CM

## 2017-11-25 DIAGNOSIS — R252 Cramp and spasm: Secondary | ICD-10-CM | POA: Diagnosis not present

## 2017-11-25 LAB — URINALYSIS, COMPLETE (UACMP) WITH MICROSCOPIC
Bilirubin Urine: NEGATIVE
GLUCOSE, UA: NEGATIVE mg/dL
HGB URINE DIPSTICK: NEGATIVE
Ketones, ur: NEGATIVE mg/dL
Leukocytes, UA: NEGATIVE
Nitrite: NEGATIVE
Protein, ur: NEGATIVE mg/dL
RBC / HPF: NONE SEEN RBC/hpf (ref 0–5)
SPECIFIC GRAVITY, URINE: 1.011 (ref 1.005–1.030)
pH: 6 (ref 5.0–8.0)

## 2017-11-25 NOTE — ED Provider Notes (Signed)
[facility] Emergency Department Provider Note  ____________________________________________   None    3:10 PM  I have reviewed the triage vital signs and the nursing notes.   HISTORY  Chief Complaint Fall   HPI Kristen Haney is a 26 y.o. female who presents to the emergency department for treatment and evaluation after sustaining a mechanical, non-syncopal fall this morning.  She states that she slipped in the rain after her left leg slipped she landed on her right buttock.  Since the fall, she has had lower back pain that is worse with movement.  She is [redacted] weeks pregnant and states that she has had some intermittent cramping since the fall, but has had similar cramping throughout this pregnancy and just wants to make sure everything is ok.  She is gravida 2 para 1 A0.  She states that she continues to feel fluttering.  She denies feeling any gush of fluids, vaginal discharge, or vaginal bleeding.  Past Medical History:  Diagnosis Date  . Abscesses of both axillae   . Hx of migraines   . Hx: UTI (urinary tract infection)   . Medical history non-contributory     Patient Active Problem List   Diagnosis Date Noted  . Sickle cell trait [facility](HCC)     Past Surgical History:  Procedure Laterality Date  . NONE      Prior to Admission medications   Medication Sig Start Date End Date Taking? Authorizing Provider  metoCLOPramide (REGLAN) 10 MG tablet Take 1 tablet (10 mg total) by mouth every 8 (eight) hours as needed for nausea or vomiting. 10/05/17 10/05/18  Phineas SemenGoodman, Graydon, MD  Prenatal Vit-Fe Fumarate-FA (PRENATAL 19) 29-1 MG CHEW Chew by mouth.    [provider]    Allergies Patient has no known allergies.  Family History  Problem Relation Age of Onset  . Hypertension Mother   . Cancer Maternal Aunt        PANCREATIC  . Stroke Paternal Grandmother   . Asthma Son     Social History Social History   Tobacco Use  . Smoking status:  Former Games developermoker  . Smokeless tobacco: Never Used  Substance Use Topics  . Alcohol use: No  . Drug use: No    Review of Systems  Constitutional: No fever/chills Eyes: No visual changes. ENT: No sore throat. Cardiovascular: Denies chest pain. Respiratory: Denies shortness of breath. Gastrointestinal: Positive for abdominal cramping.  No nausea, no vomiting. Genitourinary: Negative for dysuria. Musculoskeletal: Positive for back pain Skin: Negative for wound Neurological: Negative for headaches, focal weakness or numbness. ____________________________________________   PHYSICAL EXAM:  VITAL SIGNS: ED Triage Vitals  Enc Vitals Group     BP 11/25/17 1425 117/74     Pulse Rate 11/25/17 1425 82     Resp 11/25/17 1425 18     Temp 11/25/17 1425 98.5 F (36.9 C)     Temp Source 11/25/17 1425 Oral     SpO2 11/25/17 1425 99 %     Weight 11/25/17 1426 182 lb (82.6 kg)     Height 11/25/17 1426 5\' 8"  (1.727 m)     Head Circumference --      Peak Flow --      Pain Score 11/25/17 1425 8     Pain Loc --      Pain Edu? --      Excl. in GC? --     Constitutional: Alert and oriented. Well appearing and in no acute distress. Eyes: Conjunctivae  are normal. Head: Atraumatic. Nose: No congestion/rhinnorhea. Mouth/Throat: Mucous membranes are moist.  Neck: No stridor.   Cardiovascular: Normal rate, regular rhythm. Grossly normal heart sounds.  Good peripheral circulation. Respiratory: Normal respiratory effort.  No retractions. Lungs CTAB. Gastrointestinal: Soft and nontender.  Gravid. No abdominal bruits. No CVA tenderness. Musculoskeletal: No lower extremity tenderness nor edema.  No joint effusions. Neurologic:  Normal speech and language. No gross focal neurologic deficits are appreciated. No gait instability. Skin:  Skin is warm, dry and intact. No rash noted. Psychiatric: Mood and affect are normal. Speech and behavior are normal.  ____________________________________________     LABS (all labs ordered are listed, but only abnormal results are displayed)  Labs Reviewed  URINALYSIS, COMPLETE (UACMP) WITH MICROSCOPIC - Abnormal; Notable for the following components:      Result Value   Color, Urine STRAW (*)    APPearance CLEAR (*)    Bacteria, UA RARE (*)    Squamous Epithelial / LPF 0-5 (*)    All other components within normal limits   ____________________________________________  EKG  Not indicated ____________________________________________  RADIOLOGY  [place] Ob Limited  Result Date: 11/25/2017 CLINICAL DATA:  Cramping, fell today, [redacted] weeks pregnant EXAM: LIMITED OBSTETRIC ULTRASOUND FINDINGS: Number of Fetuses: 1 Heart Rate:  160 bpm Movement: Yes Presentation: Breech Placental Location: Anterior Previa: No Amniotic Fluid (Subjective):  Within normal limits. BPD:  3.34cm 16w  3d MATERNAL FINDINGS: Cervix:  Appears closed, 3.3 cm length. Uterus/Adnexae: No abnormality visualized. IMPRESSION: Single live intrauterine gestation as above. No acute abnormalities. This exam is performed on an emergent basis and does not comprehensively evaluate fetal size, dating, or anatomy; follow-up complete OB US should be considered if further fetal assessment is warranted. Electronically Signed   By: Ulyses SouthwardMark  Boles M.D.   On: 11/25/2017 16:08    ____________________________________________   PROCEDURES  Procedure(s) performed: None  Procedures  Critical Care performed: No  ____________________________________________   INITIAL IMPRESSION / ASSESSMENT AND PLAN / ED COURSE   26 year old female presenting to the emergency department after sustaining a mechanical, non-syncopal fall this morning before work.  While here in the emergency department, she reports that the pelvic cramping that she had experienced earlier has completely resolved.  Fetal ultrasound does not reveal any findings of concern per radiology.  Patient is to be discharged home and strict ER return  precautions were discussed.  She was also advised to call the on-call provider of her gynecologist office if she begins to have pelvic cramping or any other symptom of concern.  She was encouraged to take Tylenol for her back pain, rest, and use ice 20 minutes/h while awake.      ____________________________________________   FINAL CLINICAL IMPRESSION(S) / ED DIAGNOSES  Final diagnoses:  Acute bilateral low back pain without sciatica  Pelvic cramping     ED Discharge Orders    None       Note:  This document was prepared using Dragon voice recognition software and may include unintentional dictation errors.    Chinita Pesterriplett, Jaeshawn Silvio B, FNP 11/25/17 1638    Rockne MenghiniNorman, Anne-Caroline, MD 11/25/17 [phone]

## 2017-11-25 NOTE — Discharge Instructions (Signed)
If the cramping pain returns, please call the on call [facility] provider or return to the ER.  Take tylenol for pain and use ice 20 minutes per hour to help with the pain. Rest as much as possible for the rest of the day.

## 2017-11-25 NOTE — ED Triage Notes (Signed)
Pt comes into the ED via POV c/o a fall this morning where she slipped in the rain.  Patient is [redacted] weeks pregnant at this time. Patient states that she is now having lower back pain and cramping sensations in the lower abdomen.  Patient in NAD at this time with even and unlabored respirations.  Patient states she is still feeling the "fluttering" sensation of the baby.  Denies any vaginal bleeding.

## 2017-11-25 NOTE — ED Notes (Signed)
Attempted FHT without success. PA notified.

## 2017-11-25 NOTE — ED Notes (Signed)
Fall now with back pain. Ambulatory, denies numbness or tingling BLE. Concerned about baby.

## 2017-12-18 ENCOUNTER — Ambulatory Visit (INDEPENDENT_AMBULATORY_CARE_PROVIDER_SITE_OTHER): Payer: Medicaid Other

## 2017-12-18 ENCOUNTER — Other Ambulatory Visit: Payer: Self-pay | Admitting: Obstetrics and Gynecology

## 2017-12-18 ENCOUNTER — Ambulatory Visit (INDEPENDENT_AMBULATORY_CARE_PROVIDER_SITE_OTHER): Payer: Medicaid Other | Admitting: Obstetrics and Gynecology

## 2017-12-18 ENCOUNTER — Encounter: Payer: Self-pay | Admitting: Obstetrics and Gynecology

## 2017-12-18 VITALS — BP 99/66 | HR 73 | Wt 183.4 lb

## 2017-12-18 DIAGNOSIS — Z3482 Encounter for supervision of other normal pregnancy, second trimester: Secondary | ICD-10-CM

## 2017-12-18 LAB — POCT URINALYSIS DIPSTICK
Bilirubin, UA: NEGATIVE
Blood, UA: NEGATIVE
Glucose, UA: NEGATIVE
KETONES UA: NEGATIVE
LEUKOCYTES UA: NEGATIVE
NITRITE UA: NEGATIVE
PROTEIN UA: NEGATIVE
SPEC GRAV UA: 1.015 (ref 1.010–1.025)
Urobilinogen, UA: 0.2 E.U./dL
pH, UA: 6 (ref 5.0–8.0)

## 2017-12-18 NOTE — Progress Notes (Signed)
ROB: FAS today-needs additional views.  Subchorionic hemorrhage resolved.  Patient without complaint.  States that she is moving closer to [place] and will likely transfer her prenatal care.  AFP today.

## 2017-12-22 LAB — AFP, SERUM, OPEN SPINA BIFIDA
AFP MOM: 1.26
AFP VALUE AFPOSL: 57.1 ng/mL
Gest. Age on Collection Date: 18.7 weeks
Maternal Age At EDD: 28.5 yr
OSBR RISK 1 IN: 10000
Test Results:: NEGATIVE
WEIGHT: 183 [lb_av]

## 2018-01-15 ENCOUNTER — Encounter: Payer: Medicaid Other | Admitting: Obstetrics and Gynecology

## 2018-02-03 DIAGNOSIS — M5430 Sciatica, unspecified side: Secondary | ICD-10-CM | POA: Insufficient documentation

## 2018-08-08 ENCOUNTER — Encounter (HOSPITAL_COMMUNITY): Payer: Self-pay

## 2019-06-13 ENCOUNTER — Emergency Department
Admission: EM | Admit: 2019-06-13 | Discharge: 2019-06-15 | Disposition: A | Discharge status: Other Acute Inpatient Hospital | Payer: Medicaid Other | Attending: Emergency Medicine | Admitting: Emergency Medicine

## 2019-06-13 ENCOUNTER — Other Ambulatory Visit: Payer: Self-pay

## 2019-06-13 DIAGNOSIS — Z046 Encounter for general psychiatric examination, requested by authority: Secondary | ICD-10-CM | POA: Insufficient documentation

## 2019-06-13 DIAGNOSIS — T50902A Poisoning by unspecified drugs, medicaments and biological substances, intentional self-harm, initial encounter: Secondary | ICD-10-CM

## 2019-06-13 DIAGNOSIS — Z87891 Personal history of nicotine dependence: Secondary | ICD-10-CM | POA: Insufficient documentation

## 2019-06-13 DIAGNOSIS — T43222A Poisoning by selective serotonin reuptake inhibitors, intentional self-harm, initial encounter: Secondary | ICD-10-CM | POA: Insufficient documentation

## 2019-06-13 DIAGNOSIS — F4310 Post-traumatic stress disorder, unspecified: Secondary | ICD-10-CM | POA: Diagnosis present

## 2019-06-13 DIAGNOSIS — F32A Depression, unspecified: Secondary | ICD-10-CM

## 2019-06-13 DIAGNOSIS — F332 Major depressive disorder, recurrent severe without psychotic features: Secondary | ICD-10-CM | POA: Insufficient documentation

## 2019-06-13 DIAGNOSIS — R45851 Suicidal ideations: Secondary | ICD-10-CM | POA: Insufficient documentation

## 2019-06-13 DIAGNOSIS — Z20828 Contact with and (suspected) exposure to other viral communicable diseases: Secondary | ICD-10-CM | POA: Insufficient documentation

## 2019-06-13 DIAGNOSIS — F329 Major depressive disorder, single episode, unspecified: Secondary | ICD-10-CM

## 2019-06-13 DIAGNOSIS — Z79899 Other long term (current) drug therapy: Secondary | ICD-10-CM | POA: Insufficient documentation

## 2019-06-13 HISTORY — DX: Depression, unspecified: F32.A

## 2019-06-13 LAB — ETHANOL: Alcohol, Ethyl (B): <10 mg/dL (ref <10)

## 2019-06-13 LAB — SARS CORONAVIRUS 2 BY RT PCR (HOSPITAL ORDER, PERFORMED IN ~~LOC~~ HOSPITAL LAB): SARS Coronavirus 2: NEGATIVE

## 2019-06-13 LAB — CBC WITH DIFFERENTIAL/PLATELET
Abs Immature Granulocytes: 0.01 10*3/uL (ref 0.00–0.07)
Basophils Absolute: 0.1 10*3/uL (ref 0.0–0.1)
Basophils Relative: 1 %
Eosinophils Absolute: 0.1 10*3/uL (ref 0.0–0.5)
Eosinophils Relative: 3 %
HCT: 33.9 % — ABNORMAL LOW (ref 36.0–46.0)
Hemoglobin: 11.2 g/dL — ABNORMAL LOW (ref 12.0–15.0)
Immature Granulocytes: 0 %
Lymphocytes Relative: 28 %
Lymphs Abs: 1.2 10*3/uL (ref 0.7–4.0)
MCH: 27.8 pg (ref 26.0–34.0)
MCHC: 33 g/dL (ref 30.0–36.0)
MCV: 84.1 fL (ref 80.0–100.0)
Monocytes Absolute: 0.4 10*3/uL (ref 0.1–1.0)
Monocytes Relative: 9 %
Neutro Abs: 2.5 10*3/uL (ref 1.7–7.7)
Neutrophils Relative %: 59 %
Platelets: 207 10*3/uL (ref 150–400)
RBC: 4.03 MIL/uL (ref 3.87–5.11)
RDW: 13.7 % (ref 11.5–15.5)
WBC: 4.4 10*3/uL (ref 4.0–10.5)
nRBC: 0 % (ref 0.0–0.2)

## 2019-06-13 LAB — COMPREHENSIVE METABOLIC PANEL
ALT: 11 U/L (ref 0–44)
AST: 13 U/L — ABNORMAL LOW (ref 15–41)
Albumin: 3.8 g/dL (ref 3.5–5.0)
Alkaline Phosphatase: 44 U/L (ref 38–126)
Anion gap: 6 (ref 5–15)
BUN: 12 mg/dL (ref 6–20)
CO2: 25 mmol/L (ref 22–32)
Calcium: 8.2 mg/dL — ABNORMAL LOW (ref 8.9–10.3)
Chloride: 107 mmol/L (ref 98–111)
Creatinine, Ser: 0.62 mg/dL (ref 0.44–1.00)
GFR calc Af Amer: >60 mL/min (ref >60)
GFR calc non Af Amer: >60 mL/min (ref >60)
Glucose, Bld: 83 mg/dL (ref 70–99)
Potassium: 3.7 mmol/L (ref 3.5–5.1)
Sodium: 138 mmol/L (ref 135–145)
Total Bilirubin: 0.6 mg/dL (ref 0.3–1.2)
Total Protein: 6.7 g/dL (ref 6.5–8.1)

## 2019-06-13 LAB — ACETAMINOPHEN LEVEL: Acetaminophen (Tylenol), Serum: <10 ug/mL — ABNORMAL LOW (ref 10–30)

## 2019-06-13 LAB — SALICYLATE LEVEL: Salicylate Lvl: <7 mg/dL (ref 2.8–30.0)

## 2019-06-13 MED ORDER — LORAZEPAM 2 MG PO TABS
2.0000 mg | ORAL_TABLET | Freq: Once | ORAL | Status: AC
  Administered 2019-06-13: 2 mg via ORAL
  Filled 2019-06-13: qty 1

## 2019-06-13 NOTE — BH Assessment (Signed)
TTS will complete assessment via tele.  Please call [phone] for tele-assessment.

## 2019-06-13 NOTE — ED Notes (Signed)
Pt states the zoloft used to help her sleep but does not help anymore- usually takes 1 zoloft a day- pt states she has not slept well in the past month

## 2019-06-13 NOTE — ED Notes (Signed)
Pt belongings taken to [facility] holding

## 2019-06-13 NOTE — ED Notes (Signed)
RN at bedside to take phone back out of room. Patient aware has used all phone calls today.  Pt still angry. Have offered food or drink multiple times and patient states "I am not going to eat or drink anything when I am here".  Requesting something to help her calm down.

## 2019-06-13 NOTE — ED Notes (Addendum)
Pt has now made second and third phone call for the day.

## 2019-06-13 NOTE — ED Notes (Addendum)
Pt given warm blanket.  Hooked back on telemetry.  Pt removed to use the bathroom she reports.  Informed her if hits the call bell RN can help unhook her and that will need to remain on the heart monitor for 6 hours to that can be medically cleared. pt remains angry that she is here and will intermittently yell and be somewhat verbally aggressive in nature even though reports it is not towards staff but angry at her mother.  Call bell did not get shut off all the way after RN gave blanket and hooked to monitor and was in room yelling at secretary over intercom stating "I didn't push no button, I don't know what is wrong with you guys".

## 2019-06-13 NOTE — ED Provider Notes (Signed)
[facility] Emergency Department Provider Note  Time seen: 5:03 PM  I have reviewed the triage vital signs and the nursing notes.   HISTORY  Chief Complaint Drug Overdose   HPI Kristen Haney is a 28 y.o. female with a past medical history of depression, presents to the emergency department under IVC for intentional overdose.  According to the patient she has been getting into arguments with her child's father regarding custody of the child.  He is threatening to take custody away from her for the patient.  States she has been under a lot of stress has not been able to sleep so she took 15 25 mg Zoloft tablets.  Patient states she took them around 1 PM today.  Denies suicidal intent but states she just wants to sleep and she is under too much stress.   Largely negative review of systems otherwise.  Denies any nausea vomiting.  Denies any fever cough congestion or shortness of breath.    Past Medical History:  Diagnosis Date  . Abscesses of both axillae   . Depression   . Hx of migraines   . Hx: UTI (urinary tract infection)   . Medical history non-contributory     Patient Active Problem List   Diagnosis Date Noted  . Sickle cell trait [facility](HCC)     Past Surgical History:  Procedure Laterality Date  . NONE      Prior to Admission medications   Medication Sig Start Date End Date Taking? Authorizing Provider  sertraline (ZOLOFT) 25 MG tablet Take 25 mg by mouth daily. 05/14/19 08/12/19 Yes [provider]  metoCLOPramide (REGLAN) 10 MG tablet Take 1 tablet (10 mg total) by mouth every 8 (eight) hours as needed for nausea or vomiting. 10/05/17 10/05/18  Phineas SemenGoodman, Graydon, MD  Prenatal Vit-Fe Fumarate-FA (PRENATAL 19) 29-1 MG CHEW Chew by mouth.    [provider]    No Known Allergies  Family History  Problem Relation Age of Onset  . Hypertension Mother   . Cancer Maternal Aunt        PANCREATIC  . Stroke Paternal Grandmother   . Asthma Son      Social History Social History   Tobacco Use  . Smoking status: Former Games developermoker  . Smokeless tobacco: Never Used  Substance Use Topics  . Alcohol use: No  . Drug use: No    Review of Systems Constitutional: Negative for fever. Cardiovascular: Negative for chest pain. Respiratory: Negative for shortness of breath. Gastrointestinal: Negative for abdominal pain, vomiting Musculoskeletal: Negative for musculoskeletal complaint Neurological: Negative for headache All other ROS negative  ____________________________________________   PHYSICAL EXAM:  VITAL SIGNS: ED Triage Vitals  Enc Vitals Group     BP 06/13/19 1500 (!) 126/91     Pulse Rate 06/13/19 1458 68     Resp 06/13/19 1500 12     Temp 06/13/19 1503 98.7 F (37.1 C)     Temp Source 06/13/19 1503 Oral     SpO2 06/13/19 1458 100 %     Weight 06/13/19 1459 165 lb (74.8 kg)     Height 06/13/19 1459 5\' 8"  (1.727 m)     Head Circumference --      Peak Flow --      Pain Score 06/13/19 1459 0     Pain Loc --      Pain Edu? --      Excl. in GC? --    Constitutional: Alert and oriented.  Tearful Eyes:  Normal exam ENT      Head: Normocephalic and atraumatic.      Mouth/Throat: Mucous membranes are moist. Cardiovascular: Normal rate, regular rhythm. No murmur Respiratory: Normal respiratory effort without tachypnea nor retractions. Breath sounds are clear  Gastrointestinal: Soft and nontender. No distention.   Musculoskeletal: Nontender with normal range of motion in all extremities.  Neurologic:  Normal speech and language. No gross focal neurologic deficits  Skin:  Skin is warm, dry and intact.  Psychiatric: Patient tearful during exam.  Agitated at times.  ____________________________________________    EKG  EKG viewed and interpreted by myself shows a normal sinus rhythm at 65 bpm with a narrow QRS, normal axis, normal intervals, no concerning ST  changes.  ____________________________________________   INITIAL IMPRESSION / ASSESSMENT AND PLAN / ED COURSE  Pertinent labs & imaging results that were available during my care of the patient were reviewed by me and considered in my medical decision making (see chart for details).   Patient presents to the emergency department with intentional overdose of Zoloft.  States she was just trying to get some sleep.  Admits to being under a lot of stress.  We will maintain the IVC, check labs and have the patient see psychiatry.  Patient is agreeable to this plan.  Lab work is largely [place].  We will continue to closely monitor on a monitor until 6 hours postingestion.  As long as the patient remains well-appearing we will medically clear awaiting psychiatric disposition.  Patient remains well-appearing.  Labs large within normal limits.  Medically cleared at this point.  Psychiatry has seen and will be admitting/referring for further treatment.  Kristen Haney was evaluated in Emergency Department on 06/13/2019 for the symptoms described in the history of present illness. She was evaluated in the context of the global COVID-19 pandemic, which necessitated consideration that the patient might be at risk for infection with the SARS-CoV-2 virus that causes COVID-19. Institutional protocols and algorithms that pertain to the evaluation of patients at risk for COVID-19 are in a state of rapid change based on information released by regulatory bodies including the CDC and federal and state organizations. These policies and algorithms were followed during the patient's care in the ED.  ____________________________________________   FINAL CLINICAL IMPRESSION(S) / ED DIAGNOSES  Intentional overdose   Harvest Dark, MD 06/13/19 2133

## 2019-06-13 NOTE — ED Notes (Signed)
Pt given  Phone for phone call.  HR elevated, pt very agitated after speaking with tele psych.  Pt tearful and yelling.

## 2019-06-13 NOTE — ED Triage Notes (Addendum)
Pt arrives via EMS after taking 15 25mg  of zoloft- pt states she just wanted to sleep and did not want to hurt herself- pt had contacted family and family called EMS- EMS contacted poison control and they said she may experience tachycarida, nausea, and prolonged QT
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
## 2019-06-13 NOTE — ED Notes (Addendum)
Pt dressed out- pt belongings removed from room- 1 pair of black slippers, 1 pink tee shirt, 1 pair of pants, 1 bra, 1 pair of underwear, a cellphone, a set of keys on a pink lanyard, jewelry (a ring and a pair of earrings) and hairtie placed in a specimen cup- during this process the pt became agitated and began yelling that she was going to "beat her mom's ass" when she left- pt still has on a pair of glasses

## 2019-06-13 NOTE — ED Notes (Signed)
TTS/tele psych at bedside 

## 2019-06-13 NOTE — ED Notes (Signed)
Pt given a warm blanket, head of bed laid back, and one light turned off

## 2019-06-14 DIAGNOSIS — R45851 Suicidal ideations: Secondary | ICD-10-CM

## 2019-06-14 DIAGNOSIS — F4310 Post-traumatic stress disorder, unspecified: Secondary | ICD-10-CM | POA: Diagnosis present

## 2019-06-14 DIAGNOSIS — F332 Major depressive disorder, recurrent severe without psychotic features: Secondary | ICD-10-CM

## 2019-06-14 DIAGNOSIS — T50902A Poisoning by unspecified drugs, medicaments and biological substances, intentional self-harm, initial encounter: Secondary | ICD-10-CM | POA: Insufficient documentation

## 2019-06-14 LAB — URINE DRUG SCREEN, QUALITATIVE (ARMC ONLY)
Amphetamines, Ur Screen: NOT DETECTED
Barbiturates, Ur Screen: NOT DETECTED
Benzodiazepine, Ur Scrn: POSITIVE — AB
Cannabinoid 50 Ng, Ur ~~LOC~~: POSITIVE — AB
Cocaine Metabolite,Ur ~~LOC~~: NOT DETECTED
MDMA (Ecstasy)Ur Screen: NOT DETECTED
Methadone Scn, Ur: NOT DETECTED
Opiate, Ur Screen: NOT DETECTED
Phencyclidine (PCP) Ur S: NOT DETECTED
Tricyclic, Ur Screen: NOT DETECTED

## 2019-06-14 LAB — URINALYSIS, COMPLETE (UACMP) WITH MICROSCOPIC
Bacteria, UA: NONE SEEN
Bilirubin Urine: NEGATIVE
Glucose, UA: NEGATIVE mg/dL
Ketones, ur: 5 mg/dL — AB
Nitrite: NEGATIVE
Protein, ur: NEGATIVE mg/dL
Specific Gravity, Urine: 1.015 (ref 1.005–1.030)
pH: 5 (ref 5.0–8.0)

## 2019-06-14 LAB — PREGNANCY, URINE: Preg Test, Ur: NEGATIVE

## 2019-06-14 MED ORDER — MELATONIN 5 MG PO TABS
5.0000 mg | ORAL_TABLET | Freq: Once | ORAL | Status: DC
  Filled 2019-06-14: qty 1

## 2019-06-14 MED ORDER — LORAZEPAM 1 MG PO TABS
1.0000 mg | ORAL_TABLET | Freq: Two times a day (BID) | ORAL | Status: DC | PRN
  Administered 2019-06-14: 1 mg via ORAL
  Filled 2019-06-14: qty 1

## 2019-06-14 MED ORDER — HYDROXYZINE HCL 25 MG PO TABS: 25.0000 mg | ORAL_TABLET | Freq: Three times a day (TID) | ORAL | Status: DC | PRN

## 2019-06-14 MED ORDER — NON FORMULARY: Freq: Once | Status: DC

## 2019-06-14 MED ORDER — DIPHENHYDRAMINE HCL 25 MG PO CAPS
50.0000 mg | ORAL_CAPSULE | Freq: Once | ORAL | Status: AC
  Administered 2019-06-14: 50 mg via ORAL
  Filled 2019-06-14: qty 2

## 2019-06-14 NOTE — ED Notes (Signed)
Pt medically cleared by Ellender Hose MD.

## 2019-06-14 NOTE — ED Provider Notes (Signed)
-----------------------------------------   6:24 AM on 06/14/2019 -----------------------------------------   Blood pressure (!) 126/91, pulse 76, temperature 98.7 F (37.1 C), temperature source Oral, resp. rate 20, height 5\' 8"  (1.727 m), weight 74.8 kg, SpO2 100 %, unknown if currently breastfeeding.  The patient is sleeping at this time.  There have been no acute events since the last update.  Awaiting disposition plan from Behavioral Medicine team.   Paulette Blanch, MD 06/14/19 [phone]
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
## 2019-06-14 NOTE — ED Notes (Signed)
Patient resting quietly in room. No noted distress or abnormal behaviors noted. Will continue 15 minute checks. 

## 2019-06-14 NOTE — ED Notes (Signed)
Pt requested broth. When nurse told told patient all we had to give her was ginger ale patient was rude and agitated.
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
## 2019-06-14 NOTE — BH Assessment (Signed)
Patient has been accepted to [facility].  Patient assigned to room 402-2. Accepting physician is Dr. Yisroel Ramming. Attending physician is Dr. Parke Poisson.  Call report to [phone] PRIOR to transport.  Representative was [facility].   Patient assigned to [facility] 402-2. Can arrive at 8:30pm on 06/14/2019.  Admitting: MONEY. Attending: [facility] ER Staff is aware of it:  Ronnie, ER Secretary  Dr. Ellender Hose, ER MD  Rush Landmark, Patient's Nurse

## 2019-06-14 NOTE — BH Assessment (Signed)
Patient is under review by [facility].  Patient needs to complete urine drug screen and have urine pregnancy test.  Please fax IVC papers to [phone].

## 2019-06-14 NOTE — ED Notes (Signed)
Pt again asking for medicine to help her sleep. Another RN offered patient the ordered Benadryl.  Pt refused stating she could take that at home.

## 2019-06-14 NOTE — ED Notes (Signed)
Per [facility] AC patient can be transported in the morning.  

## 2019-06-15 ENCOUNTER — Inpatient Hospital Stay (HOSPITAL_COMMUNITY)
Admission: AD | Admit: 2019-06-15 | Discharge: 2019-06-17 | DRG: 885 | Disposition: A | Discharge status: Home / Self Care | Payer: Federal, State, Local not specified - Other | Source: Intra-hospital | Attending: Psychiatry | Admitting: Psychiatry

## 2019-06-15 ENCOUNTER — Other Ambulatory Visit: Payer: Self-pay

## 2019-06-15 ENCOUNTER — Encounter (HOSPITAL_COMMUNITY): Payer: Self-pay | Admitting: *Deleted

## 2019-06-15 DIAGNOSIS — Z62810 Personal history of physical and sexual abuse in childhood: Secondary | ICD-10-CM | POA: Diagnosis present

## 2019-06-15 DIAGNOSIS — F17210 Nicotine dependence, cigarettes, uncomplicated: Secondary | ICD-10-CM | POA: Diagnosis present

## 2019-06-15 DIAGNOSIS — F1290 Cannabis use, unspecified, uncomplicated: Secondary | ICD-10-CM | POA: Diagnosis present

## 2019-06-15 DIAGNOSIS — G4700 Insomnia, unspecified: Secondary | ICD-10-CM | POA: Diagnosis present

## 2019-06-15 DIAGNOSIS — Z915 Personal history of self-harm: Secondary | ICD-10-CM

## 2019-06-15 DIAGNOSIS — F332 Major depressive disorder, recurrent severe without psychotic features: Secondary | ICD-10-CM | POA: Diagnosis present

## 2019-06-15 DIAGNOSIS — Z825 Family history of asthma and other chronic lower respiratory diseases: Secondary | ICD-10-CM | POA: Diagnosis not present

## 2019-06-15 DIAGNOSIS — F4310 Post-traumatic stress disorder, unspecified: Secondary | ICD-10-CM | POA: Diagnosis present

## 2019-06-15 DIAGNOSIS — Z79899 Other long term (current) drug therapy: Secondary | ICD-10-CM

## 2019-06-15 DIAGNOSIS — Z8249 Family history of ischemic heart disease and other diseases of the circulatory system: Secondary | ICD-10-CM

## 2019-06-15 DIAGNOSIS — Z823 Family history of stroke: Secondary | ICD-10-CM

## 2019-06-15 MED ORDER — ALUM & MAG HYDROXIDE-SIMETH 200-200-20 MG/5ML PO SUSP: 30.0000 mL | ORAL | Status: DC | PRN

## 2019-06-15 MED ORDER — HYDROXYZINE HCL 25 MG PO TABS: 25.0000 mg | ORAL_TABLET | Freq: Three times a day (TID) | ORAL | Status: DC | PRN

## 2019-06-15 MED ORDER — ACETAMINOPHEN 325 MG PO TABS: 650.0000 mg | ORAL_TABLET | Freq: Four times a day (QID) | ORAL | Status: DC | PRN

## 2019-06-15 MED ORDER — ENSURE ENLIVE PO LIQD
237.0000 mL | Freq: Two times a day (BID) | ORAL | Status: DC
  Administered 2019-06-15 – 2019-06-17 (×2): 237 mL via ORAL

## 2019-06-15 MED ORDER — MAGNESIUM HYDROXIDE 400 MG/5ML PO SUSP: 30.0000 mL | Freq: Every day | ORAL | Status: DC | PRN

## 2019-06-15 NOTE — Progress Notes (Signed)
Kristen Haney is a 28 year old female pt admitted on involuntary basis from [facility]. On admission, she reports that she has been feeling stressed and did take overdose of zoloft. She currently denies SI and is able to contract for safety while in the hospital. She does endorse marijuana usage but denies any other substance use. She reports that she lives with her children and reports that she will return to the same living situation upon discharge. She was escorted to the unit, oriented to the milieu and safety maintained.

## 2019-06-15 NOTE — ED Notes (Signed)
AAOx3.  Skin warm and dry.  NAD 

## 2019-06-15 NOTE — ED Notes (Signed)
SPoke with patient's mother, with patient's consent.  Updated on plan of care and pending transfer to [facility].  Reassurance given and well received.  Will call patient's mom once [facility] Deputies arrive to transport patient.

## 2019-06-15 NOTE — ED Notes (Signed)
Pt given lunch tray.

## 2019-06-15 NOTE — ED Notes (Signed)
[facility]  DEPT  CALLED  FOR TRANSPORT 

## 2019-06-15 NOTE — ED Notes (Signed)
patinet's mother called by patient to inform of pending transfer.

## 2019-06-15 NOTE — Tx Team (Signed)
Initial Treatment Plan 06/15/2019 2:44 PM Kristen Haney ZOX:[id number]    PATIENT STRESSORS: Financial difficulties Marital or family conflict   PATIENT STRENGTHS: Ability for insight Average or above average intelligence Capable of independent living General fund of knowledge Supportive family/friends   PATIENT IDENTIFIED PROBLEMS: Depression Suicidal thoughts "I need outpatient therapy"                     DISCHARGE CRITERIA:  Ability to meet basic life and health needs Improved stabilization in mood, thinking, and/or behavior Reduction of life-threatening or endangering symptoms to within safe limits Verbal commitment to aftercare and medication compliance  PRELIMINARY DISCHARGE PLAN: Attend aftercare/continuing care group Return to previous living arrangement  PATIENT/FAMILY INVOLVEMENT: This treatment plan has been presented to and reviewed with the patient, Kristen Haney, and/or family member, .  The patient and family have been given the opportunity to ask questions and make suggestions.  [place], [place], [place] 06/15/2019, 2:44 PM

## 2019-06-15 NOTE — ED Notes (Signed)
Pt asleep in bed, breakfast tray placed in rm.  

## 2019-06-15 NOTE — ED Provider Notes (Signed)
-----------------------------------------   6:13 AM on 06/15/2019 -----------------------------------------   Blood pressure 108/73, pulse 68, temperature 98.9 F (37.2 C), temperature source Oral, resp. rate 18, height 5\' 8"  (1.727 m), weight 74.8 kg, SpO2 100 %, unknown if currently breastfeeding.  The patient is sleeping at this time.  There have been no acute events since the last update.  Awaiting disposition plan from Behavioral Medicine team.   Paulette Blanch, MD 06/15/19 [phone]

## 2019-06-15 NOTE — Progress Notes (Signed)
[place] NOVEL CORONAVIRUS (COVID-19) DAILY CHECK-OFF SYMPTOMS - answer yes or no to each - every day NO YES  Have you had a fever in the past 24 hours?  . Fever (Temp > 37.80C / 100F) X   Have you had any of these symptoms in the past 24 hours? . New Cough .  Sore Throat  .  Shortness of Breath .  Difficulty Breathing .  Unexplained Body Aches   X   Have you had any one of these symptoms in the past 24 hours not related to allergies?   . Runny Nose .  Nasal Congestion .  Sneezing   X   If you have had runny nose, nasal congestion, sneezing in the past 24 hours, has it worsened?  X   EXPOSURES - check yes or no X   Have you traveled outside the state in the past 14 days?  X   Have you been in contact with someone with a confirmed diagnosis of COVID-19 or PUI in the past 14 days without wearing appropriate PPE?  X   Have you been living in the same home as a person with confirmed diagnosis of COVID-19 or a PUI (household contact)?    X   Have you been diagnosed with COVID-19?    X              What to do next: Answered NO to all: Answered YES to anything:   Proceed with unit schedule Follow the [facility] Inpatient Flowsheet.   

## 2019-06-16 DIAGNOSIS — F332 Major depressive disorder, recurrent severe without psychotic features: Principal | ICD-10-CM

## 2019-06-16 MED ORDER — TRAZODONE HCL 50 MG PO TABS
50.0000 mg | ORAL_TABLET | Freq: Every evening | ORAL | Status: DC | PRN
  Administered 2019-06-16: 50 mg via ORAL
  Filled 2019-06-16: qty 1

## 2019-06-16 MED ORDER — SERTRALINE HCL 50 MG PO TABS
50.0000 mg | ORAL_TABLET | Freq: Every day | ORAL | Status: DC
  Administered 2019-06-16 – 2019-06-17 (×2): 50 mg via ORAL
  Filled 2019-06-16 (×4): qty 1

## 2019-06-16 NOTE — Progress Notes (Signed)
Patient ID: Kristen Haney, female   DOB: 05-27-1991, 28 y.o.   MRN: [id number]   D. Patient complaining of nausea and diarrhea. Patient has depressed mood and affect.  A. Medication given for issues. All other meds given as ordered.\  R. Patient is cooperative. Patient is safe on the unit.
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
## 2019-06-16 NOTE — Progress Notes (Signed)
Patient ID: Kristen Haney, female   DOB: 02-22-1991, 28 y.o.   MRN: [id number]  [place] NOVEL CORONAVIRUS (COVID-19) DAILY CHECK-OFF SYMPTOMS - answer yes or no to each - every day NO YES  Have you had a fever in the past 24 hours?  . Fever (Temp > 37.80C / 100F) X   Have you had any of these symptoms in the past 24 hours? . New Cough .  Sore Throat  .  Shortness of Breath .  Difficulty Breathing .  Unexplained Body Aches   X   Have you had any one of these symptoms in the past 24 hours not related to allergies?   . Runny Nose .  Nasal Congestion .  Sneezing   X   If you have had runny nose, nasal congestion, sneezing in the past 24 hours, has it worsened?  X   EXPOSURES - check yes or no X   Have you traveled outside the state in the past 14 days?  X   Have you been in contact with someone with a confirmed diagnosis of COVID-19 or PUI in the past 14 days without wearing appropriate PPE?  X   Have you been living in the same home as a person with confirmed diagnosis of COVID-19 or a PUI (household contact)?    X   Have you been diagnosed with COVID-19?    X              What to do next: Answered NO to all: Answered YES to anything:   Proceed with unit schedule Follow the [facility] Inpatient Flowsheet.
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
## 2019-06-16 NOTE — Progress Notes (Signed)
The focus of this group is to help patients establish daily goals to achieve during treatment and discuss how the patient can incorporate goal setting into their daily lives to aide in recovery. 
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
## 2019-06-16 NOTE — Progress Notes (Signed)
NUTRITION ASSESSMENT  Pt identified as at risk on the Malnutrition Screen Tool  INTERVENTION: 1. Supplements: Ensure Enlive po BID, each supplement provides 350 kcal and 20 grams of protein  NUTRITION DIAGNOSIS: Unintentional weight loss related to sub-optimal intake as evidenced by pt report.   Goal: Pt to meet >/= 90% of their estimated nutrition needs.  Monitor:  PO intake  Assessment:  Pt admitted with depression. Per weight records, pt has lost 6 lbs but unable to determine time frame. Ensure supplements have been ordered, will continue.  Height: Ht Readings from Last 1 Encounters:  06/15/19 5\' 8"  (1.727 m)    Weight: Wt Readings from Last 1 Encounters:  06/15/19 71.7 kg    Weight Hx: Wt Readings from Last 10 Encounters:  06/15/19 71.7 kg  06/13/19 74.8 kg  12/18/17 83.2 kg  11/25/17 82.6 kg  11/20/17 82.6 kg  10/31/17 81.9 kg  10/30/17 81.3 kg  10/23/17 81.3 kg  10/18/17 80.4 kg  10/05/17 77.1 kg    BMI:  Body mass index is 24.02 kg/m. Pt meets criteria for normal based on current BMI.  Estimated Nutritional Needs: Kcal: 25-30 kcal/kg Protein: > 1 gram protein/kg Fluid: 1 ml/kcal  Diet Order:  Diet Order            Diet Heart Room service appropriate? Yes; Fluid consistency: Thin  Diet effective now             Pt is also offered choice of unit snacks mid-morning and mid-afternoon.  Pt is eating as desired.   Lab results and medications reviewed.   Clayton Bibles, MS, RD, [place] Dietitian Pager: [phone] After Hours Pager: [phone]

## 2019-06-16 NOTE — Progress Notes (Signed)
Spiritual care group on grief and loss facilitated by chaplain Jerene Pitch  Group Goal:  Support / Education around grief and loss Members engage in facilitated group support and psycho social education.  Group Description:  Following introductions and group rules,  Group members engaged in facilitated group dialog and support around topic of loss, with particular support around experiences of loss in their lives. Group Identified types of loss (relationships / self / things) and identified patterns, circumstances, and changes that precipitate losses. Reflected on thoughts / feelings around loss, normalized grief responses, and recognized variety in grief experience. Patient Progress:  Pt arrived near conclusion of group.  Engaged in facilitated dialog - especially around indicators that we need extra support in grief.  Was engaged in conversation around difficulty of making decisions in grief.

## 2019-06-17 MED ORDER — SERTRALINE HCL 50 MG PO TABS: 50.0000 mg | ORAL_TABLET | Freq: Every day | ORAL | 0 refills | Status: DC

## 2019-06-17 MED ORDER — TRAZODONE HCL 50 MG PO TABS: 50.0000 mg | ORAL_TABLET | Freq: Every evening | ORAL | 0 refills | Status: DC | PRN
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
## 2019-06-17 NOTE — Progress Notes (Signed)
D: Patient verbalizes readiness for discharge. Denies suicidal and homicidal ideations. Denies auditory and visual hallucinations.  No complaints of pain.  A:  Patient receptive to discharge instructions. Questions encouraged, both verbalize understanding.  R:  Escorted to the lobby by this RN.  

## 2019-06-17 NOTE — Progress Notes (Signed)
[place] NOVEL CORONAVIRUS (COVID-19) DAILY CHECK-OFF SYMPTOMS - answer yes or no to each - every day NO YES  Have you had a fever in the past 24 hours?  . Fever (Temp > 37.80C / 100F) X   Have you had any of these symptoms in the past 24 hours? . New Cough .  Sore Throat  .  Shortness of Breath .  Difficulty Breathing .  Unexplained Body Aches   X   Have you had any one of these symptoms in the past 24 hours not related to allergies?   . Runny Nose .  Nasal Congestion .  Sneezing   X   If you have had runny nose, nasal congestion, sneezing in the past 24 hours, has it worsened?  X   EXPOSURES - check yes or no X   Have you traveled outside the state in the past 14 days?  X   Have you been in contact with someone with a confirmed diagnosis of COVID-19 or PUI in the past 14 days without wearing appropriate PPE?  X   Have you been living in the same home as a person with confirmed diagnosis of COVID-19 or a PUI (household contact)?    X   Have you been diagnosed with COVID-19?    X              What to do next: Answered NO to all: Answered YES to anything:   Proceed with unit schedule Follow the [facility] Inpatient Flowsheet.   

## 2019-06-17 NOTE — Tx Team (Signed)
Interdisciplinary Treatment and Diagnostic Plan Update  06/17/2019 Time of Session: [place] MRN: [id number]  Principal Diagnosis: <principal problem not specified>  Secondary Diagnoses: Active Problems:   Major depressive disorder, recurrent severe without psychotic features ([place])   Current Medications:  Current Facility-Administered Medications  Medication Dose Route Frequency Provider Last Rate Last Dose  . acetaminophen (TYLENOL) tablet 650 mg  650 mg Oral Q6H PRN Patrecia Pour, NP      . alum & mag hydroxide-simeth (MAALOX/MYLANTA) 200-200-20 MG/5ML suspension 30 mL  30 mL Oral Q4H PRN Patrecia Pour, NP      . feeding supplement (ENSURE ENLIVE) (ENSURE ENLIVE) liquid 237 mL  237 mL Oral BID BM Cobos, Myer Peer, MD   237 mL at 06/17/19 0920  . hydrOXYzine (ATARAX/VISTARIL) tablet 25 mg  25 mg Oral TID PRN Patrecia Pour, NP      . magnesium hydroxide (MILK OF MAGNESIA) suspension 30 mL  30 mL Oral Daily PRN Patrecia Pour, NP      . sertraline (ZOLOFT) tablet 50 mg  50 mg Oral Daily Sharma Covert, MD   50 mg at 06/17/19 0920  . traZODone (DESYREL) tablet 50 mg  50 mg Oral QHS PRN Sharma Covert, MD   50 mg at 06/16/19 2046   PTA Medications: Medications Prior to Admission  Medication Sig Dispense Refill Last Dose  . sertraline (ZOLOFT) 25 MG tablet Take 25 mg by mouth daily.       Patient Stressors: Financial difficulties Marital or family conflict  Patient Strengths: Ability for insight Average or above average intelligence Capable of independent living General fund of knowledge Supportive family/friends  Treatment Modalities: Medication Management, Group therapy, Case management,  1 to 1 session with clinician, Psychoeducation, Recreational therapy.   Physician Treatment Plan for Primary Diagnosis: <principal problem not specified> Long Term Goal(s): Improvement in symptoms so as ready for discharge Improvement in symptoms so as ready for  discharge   Short Term Goals: Ability to identify changes in lifestyle to reduce recurrence of condition will improve Ability to verbalize feelings will improve Ability to disclose and discuss suicidal ideas Ability to demonstrate self-control will improve Ability to identify and develop effective coping behaviors will improve  Medication Management: Evaluate patient's response, side effects, and tolerance of medication regimen.  Therapeutic Interventions: 1 to 1 sessions, Unit Group sessions and Medication administration.  Evaluation of Outcomes: Progressing  Physician Treatment Plan for Secondary Diagnosis: Active Problems:   Major depressive disorder, recurrent severe without psychotic features ([place])  Long Term Goal(s): Improvement in symptoms so as ready for discharge Improvement in symptoms so as ready for discharge   Short Term Goals: Ability to identify changes in lifestyle to reduce recurrence of condition will improve Ability to verbalize feelings will improve Ability to disclose and discuss suicidal ideas Ability to demonstrate self-control will improve Ability to identify and develop effective coping behaviors will improve     Medication Management: Evaluate patient's response, side effects, and tolerance of medication regimen.  Therapeutic Interventions: 1 to 1 sessions, Unit Group sessions and Medication administration.  Evaluation of Outcomes: Progressing   RN Treatment Plan for Primary Diagnosis: <principal problem not specified> Long Term Goal(s): Knowledge of disease and therapeutic regimen to maintain health will improve  Short Term Goals: Ability to identify and develop effective coping behaviors will improve and Compliance with prescribed medications will improve  Medication Management: RN will administer medications as ordered by provider, will assess and evaluate patient's  response and provide education to patient for prescribed medication. RN will report  any adverse and/or side effects to prescribing provider.  Therapeutic Interventions: 1 on 1 counseling sessions, Psychoeducation, Medication administration, Evaluate responses to treatment, Monitor vital signs and CBGs as ordered, Perform/monitor CIWA, COWS, AIMS and Fall Risk screenings as ordered, Perform wound care treatments as ordered.  Evaluation of Outcomes: Progressing   LCSW Treatment Plan for Primary Diagnosis: <principal problem not specified> Long Term Goal(s): Safe transition to appropriate next level of care at discharge, Engage patient in therapeutic group addressing interpersonal concerns.  Short Term Goals: Engage patient in aftercare planning with referrals and resources, Increase social support and Increase skills for wellness and recovery  Therapeutic Interventions: Assess for all discharge needs, 1 to 1 time with Social worker, Explore available resources and support systems, Assess for adequacy in community support network, Educate family and significant other(s) on suicide prevention, Complete Psychosocial Assessment, Interpersonal group therapy.  Evaluation of Outcomes: Progressing   Progress in Treatment: Attending groups: No. Participating in groups: No. Taking medication as prescribed: Yes. Toleration medication: Yes. Family/Significant other contact made: Yes, individual(s) contacted:  mother Patient understands diagnosis: Yes. Discussing patient identified problems/goals with staff: Yes. Medical problems stabilized or resolved: Yes. Denies suicidal/homicidal ideation: Yes. Issues/concerns per patient self-inventory: No. Other: none  New problem(s) identified: No, Describe:  none  New Short Term/Long Term Goal(s):  Patient Goals:  Better coping skills  Discharge Plan or Barriers:   Reason for Continuation of Hospitalization: Depression Medication stabilization Attendees: Patient:Kristen Haney 06/17/2019 10:13 AM  Physician: Dr. Jola Babinskilary, MD 06/17/2019  10:13 AM  Nursing: Thelma CompAmanda Collazo, RN 06/17/2019 10:13 AM  RN Care Manager: 06/17/2019 10:13 AM  Social Worker: Daleen SquibbGreg Egbert Seidel, LCSW 06/17/2019 10:13 AM  Recreational Therapist:  06/17/2019 10:13 AM  Other:  06/17/2019 10:13 AM  Other:  06/17/2019 10:13 AM  Other: 06/17/2019 10:13 AM   Estimated Length of Stay:2-4 days       Scribe for Treatment Team: Lorri FrederickWierda, Narada Uzzle Jon, LCSW 06/17/2019 10:23 AM

## 2019-06-17 NOTE — [facility] Suicide Risk Assessment (Signed)
[facility] Discharge Suicide Risk Assessment   Principal Problem: <principal problem not specified> Discharge Diagnoses: Active Problems:   Major depressive disorder, recurrent severe without psychotic features ([place])   Total Time spent with patient: 30 minutes  Musculoskeletal: Strength & Muscle Tone: within normal limits Gait & Station: normal Patient leans: N/A  Psychiatric Specialty Exam: Review of Systems  All other systems reviewed and are negative.   Blood pressure 113/74, pulse 82, temperature 98.4 F (36.9 C), temperature source Oral, resp. rate 16, height 5\' 8"  (1.727 m), weight 71.7 kg, SpO2 98 %, unknown if currently breastfeeding.Body mass index is 24.02 kg/m.  General Appearance: Casual  Eye Contact::  Good  Speech:  Normal Rate409  Volume:  Normal  Mood:  Anxious  Affect:  Congruent  Thought Process:  Coherent and Descriptions of Associations: Intact  Orientation:  Full (Time, Place, and Person)  Thought Content:  Logical  Suicidal Thoughts:  No  Homicidal Thoughts:  No  Memory:  Immediate;   Good Recent;   Good Remote;   Good  Judgement:  Intact  Insight:  Good  Psychomotor Activity:  Normal  Concentration:  Good  Recall:  Good  Fund of Knowledge:Good  Language: Good  Akathisia:  Negative  Handed:  Right  AIMS (if indicated):     Assets:  Desire for Improvement Resilience  Sleep:  Number of Hours: 6.75  Cognition: WNL  ADL's:  Intact   Mental Status Per Nursing Assessment::   On Admission:  NA  Demographic Factors:  Low socioeconomic status  Loss Factors: Financial problems/change in socioeconomic status  Historical Factors: Impulsivity  Risk Reduction Factors:   Responsible for children under 62 years of age, Sense of responsibility to family, Living with another person, especially a relative and Positive social support  Continued Clinical Symptoms:  Depression:   Impulsivity  Cognitive Features That Contribute To Risk:  None     Suicide Risk:  Minimal: No identifiable suicidal ideation.  Patients presenting with no risk factors but with morbid ruminations; may be classified as minimal risk based on the severity of the depressive symptoms  Follow-up Information    [facility] Follow up.   Why: Please attend walk in hours from 9-4 Monday through Friday. Contact information: [place] [address] [phone]           Plan Of Care/Follow-up recommendations:  Activity:  ad lib  Sharma Covert, MD 06/17/2019, 11:57 AM

## 2019-06-17 NOTE — [facility] Group Notes (Signed)
Occupational Therapy Group Note  Date:  06/17/2019 Time:  11:50 AM  Group Topic/Focus:  Self Esteem Action Plan:   The focus of this group is to help patients create a plan to continue to build self-esteem after discharge.  Participation Level:  Active  Participation Quality:  Appropriate  Affect:  Depressed and Flat  Cognitive:  Appropriate  Insight: Improving  Engagement in Group:  Engaged  Modes of Intervention:  Activity, Education and Socialization  Additional Comments:    S: "My children increase my self esteem"   O: OT tx with focus on self esteem building this date. Education given on definition of self esteem, with both causes of low and high self esteem identified. Activity given for pt to identify a positive/aspiring trait for each letter of the alphabet. Pt to work with peers to help complete activity and build positive thinking.   A: Pt presents with depressed and flat affect, engaged and participatory throughout session. Pt shares how her children increase her self esteem. She shares that comparisons can decrease her self esteem. She started to complete the A-Z Activity and started to become tearful and did not finish.   P: OT group will be x1 per week while pt inpatient  Zenovia Jarred, MSOT, OTR/L [place] Office: [place] 06/17/2019, 11:50 AM

## 2019-06-17 NOTE — Progress Notes (Signed)
Pt affect anxious, mood depressed, tearful. Pt stated that she had a bad day, and feels like she can go home now. Pt tearful, stating that a peer is getting on her nerves and that she had a "panic attack earlier in the day." Pt given trazodone for sleep, and stated that she was feeling better currently. Pt denies SI/HI or hallucinations (a) 15 min checks (r) safety maintained.

## 2019-06-17 NOTE — Progress Notes (Signed)
[place] NOVEL CORONAVIRUS (COVID-19) DAILY CHECK-OFF SYMPTOMS - answer yes or no to each - every day NO YES  Have you had a fever in the past 24 hours?  . Fever (Temp > 37.80C / 100F) X   Have you had any of these symptoms in the past 24 hours? . New Cough .  Sore Throat  .  Shortness of Breath .  Difficulty Breathing .  Unexplained Body Aches   X   Have you had any one of these symptoms in the past 24 hours not related to allergies?   . Runny Nose .  Nasal Congestion .  Sneezing   X   If you have had runny nose, nasal congestion, sneezing in the past 24 hours, has it worsened?  X   EXPOSURES - check yes or no X   Have you traveled outside the state in the past 14 days?  X   Have you been in contact with someone with a confirmed diagnosis of COVID-19 or PUI in the past 14 days without wearing appropriate PPE?  X   Have you been living in the same home as a person with confirmed diagnosis of COVID-19 or a PUI (household contact)?    X   Have you been diagnosed with COVID-19?    X              What to do next: Answered NO to all: Answered YES to anything:   Proceed with unit schedule Follow the [facility] Inpatient Flowsheet.   

## 2019-06-17 NOTE — Progress Notes (Signed)
  [facility] Adult Case Management Discharge Plan :  Will you be returning to the same living situation after discharge:  Yes,  own home At discharge, do you have transportation home?: Yes,  mother Do you have the ability to pay for your medications: No. Will work with [employer].  Release of information consent forms completed and in the chart;  Patient's signature needed at discharge.  Patient to Follow up at: Follow-up Information    [facility] Follow up.   Why: Please attend walk in hours from 9-4 Monday through Friday. Contact information: [place] [address] [phone]           Next level of care provider has access to Will and Suicide Prevention discussed: Yes,  with mother  Have you used any form of tobacco in the last 30 days? (Cigarettes, Smokeless Tobacco, Cigars, and/or Pipes): Yes  Has patient been referred to the Quitline?: Yes, faxed on 06/17/19  Patient has been referred for addiction treatment: Yes, Nicoletta Dress, [place] 06/17/2019, 1:37 PM

## 2019-07-17 ENCOUNTER — Other Ambulatory Visit: Payer: Self-pay

## 2019-07-17 ENCOUNTER — Encounter: Payer: Self-pay | Admitting: Emergency Medicine

## 2019-07-17 ENCOUNTER — Emergency Department
Admission: EM | Admit: 2019-07-17 | Discharge: 2019-07-17 | Disposition: A | Discharge status: Home / Self Care | Payer: Self-pay | Attending: Student in an Organized Health Care Education/Training Program | Admitting: Student in an Organized Health Care Education/Training Program

## 2019-07-17 DIAGNOSIS — D573 Sickle-cell trait: Secondary | ICD-10-CM | POA: Insufficient documentation

## 2019-07-17 DIAGNOSIS — F17210 Nicotine dependence, cigarettes, uncomplicated: Secondary | ICD-10-CM | POA: Insufficient documentation

## 2019-07-17 DIAGNOSIS — Z79899 Other long term (current) drug therapy: Secondary | ICD-10-CM | POA: Insufficient documentation

## 2019-07-17 DIAGNOSIS — L732 Hidradenitis suppurativa: Secondary | ICD-10-CM | POA: Insufficient documentation

## 2019-07-17 MED ORDER — TRAMADOL HCL 50 MG PO TABS: 50.0000 mg | ORAL_TABLET | Freq: Four times a day (QID) | ORAL | 0 refills | Status: DC | PRN

## 2019-07-17 MED ORDER — CLINDAMYCIN HCL 300 MG PO CAPS: 300.0000 mg | ORAL_CAPSULE | Freq: Three times a day (TID) | ORAL | 0 refills | Status: AC

## 2019-07-17 NOTE — ED Provider Notes (Signed)
[facility] Emergency Department Provider Note   ____________________________________________   None    (approximate)  I have reviewed the triage vital signs and the nursing notes.   HISTORY  Chief Complaint Abscess    HPI Kristen Haney is a 28 y.o. female patient complaint abscess right axillary area.  Patient lesion has increased in past 3 days.  Patient denies drainage.  Patient rates the pain as a 10/10.  Patient scribed pain is "achy".  No palliative measure for complaint.  Patient states recurrent condition for over 20 years.    Past Medical History:  Diagnosis Date  . Abscesses of both axillae   . Depression   . Hx of migraines   . Hx: UTI (urinary tract infection)   . Medical history non-contributory     Patient Active Problem List   Diagnosis Date Noted  . PTSD (post-traumatic stress disorder) 06/14/2019  . 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 Intentional drug overdose (HCC)   . Sickle cell trait [facility](HCC)     Past Surgical History:  Procedure Laterality Date  . NONE      Prior to Admission medications   Medication Sig Start Date End Date Taking? Authorizing Provider  clindamycin (CLEOCIN) 300 MG capsule Take 1 capsule (300 mg total) by mouth 3 (three) times daily for 10 days. 07/17/19 07/27/19  Joni ReiningSmith, Maddie Brazier K, PA-C  sertraline (ZOLOFT) 50 MG tablet Take 1 tablet (50 mg total) by mouth daily. 06/18/19   Aldean BakerSykes, Janet E, NP  traMADol (ULTRAM) 50 MG tablet Take 1 tablet (50 mg total) by mouth every 6 (six) hours as needed. 07/17/19 07/16/20  Joni ReiningSmith, Aariyana Manz K, PA-C  traZODone (DESYREL) 50 MG tablet Take 1 tablet (50 mg total) by mouth at bedtime as needed for sleep. 06/17/19   Aldean BakerSykes, Janet E, NP    Allergies Patient has no known allergies.  Family History  Problem Relation Age of Onset  . Hypertension Mother   . Cancer Maternal Aunt        PANCREATIC  . Stroke Paternal Grandmother    . Asthma Son     Social History Social History   Tobacco Use  . Smoking status: Current Every Day Smoker    Packs/day: 0.50    Types: Cigarettes  . Smokeless tobacco: Never Used  Substance Use Topics  . Alcohol use: No  . Drug use: Yes    Types: Marijuana    Review of Systems Constitutional: No fever/chills Eyes: No visual changes. ENT: No sore throat. Cardiovascular: Denies chest pain. Respiratory: Denies shortness of breath. Gastrointestinal: No abdominal pain.  No nausea, no vomiting.  No diarrhea.  No constipation. Genitourinary: Negative for dysuria. Musculoskeletal: Negative for back pain. Skin: Negative for rash.  Pain edema right axillary area. Neurological: Negative for headaches, focal weakness or numbness. Psychiatric:  Depression and PTSD. Hematological/Lymphatic:  Sickle cell trait.   ____________________________________________   PHYSICAL EXAM:  VITAL SIGNS: ED Triage Vitals  Enc Vitals Group     BP 07/17/19 0810 113/74     Pulse Rate 07/17/19 0810 76     Resp 07/17/19 0810 20     Temp 07/17/19 0810 98.2 F (36.8 C)     Temp Source 07/17/19 0810 Oral     SpO2 07/17/19 0810 100 %     Weight 07/17/19 0804 158 lb (71.7 kg)     Height 07/17/19 0804 5\' 8"  (1.727 m)     Head Circumference --  Peak Flow --      Pain Score 07/17/19 0803 10     Pain Loc --      Pain Edu? --      Excl. in [place]? --     Constitutional: Alert and oriented. Well appearing and in no acute distress. Cardiovascular: Normal rate, regular rhythm. Grossly normal heart sounds.  Good peripheral circulation. Respiratory: Normal respiratory effort.  No retractions. Lungs CTAB. Skin: Nodule lesion on erythematous base bilateral axillary area.  Keloid tissue secondary to repeat incision and drainage.  Questionable sinus tracts from Hidradenitis. Psychiatric: Mood and affect are normal. Speech and behavior are normal.  ____________________________________________   LABS (all  labs ordered are listed, but only abnormal results are displayed)  Labs Reviewed - No data to display ____________________________________________  EKG   ____________________________________________  RADIOLOGY  ED MD interpretation:    Official radiology report(s): No results found.  ____________________________________________   PROCEDURES  Procedure(s) performed (including Critical Care):  Procedures   ____________________________________________   INITIAL IMPRESSION / ASSESSMENT AND PLAN / ED COURSE  As part of my medical decision making, I reviewed the following data within the [place] was evaluated in Emergency Department on 07/17/2019 for the symptoms described in the history of present illness. She was evaluated in the context of the global COVID-19 pandemic, which necessitated consideration that the patient might be at risk for infection with the SARS-CoV-2 virus that causes COVID-19. Institutional protocols and algorithms that pertain to the evaluation of patients at risk for COVID-19 are in a state of rapid change based on information released by regulatory bodies including the CDC and federal and state organizations. These policies and algorithms were followed during the patient's care in the ED.   Patient presents with pain and edema to right axillary area secondary to hidradenitis.  Lesion is nonfluctuant this time.  Discussed with patient rationale for not incised and drained at this time.  Discussed consult to dermatology for definitive evaluation and treatment.  Patient will start antibiotics and medication for pain pending evaluation by dermatology.      ____________________________________________   FINAL CLINICAL IMPRESSION(S) / ED DIAGNOSES  Final diagnoses:  Hidradenitis     ED Discharge Orders         Ordered    clindamycin (CLEOCIN) 300 MG capsule  3 times daily     07/17/19 0828    traMADol  (ULTRAM) 50 MG tablet  Every 6 hours PRN     07/17/19 2703           Note:  This document was prepared using Dragon voice recognition software and may include unintentional dictation errors.    Sable Feil, PA-C 07/17/19 5009    Merlyn Lot, MD 07/17/19 [phone]

## 2019-07-17 NOTE — ED Triage Notes (Signed)
Pt reports abscess under right armpit  for the past few days. Pt states that this has been a reoccurring thing.

## 2019-07-17 NOTE — ED Notes (Signed)
See triage note  Presents with possible abscess area under right arm  Hx of same  Has had areas lanced several times

## 2019-07-17 NOTE — Discharge Instructions (Signed)
Follow discharge care instruction take medication as directed. °

## 2019-09-17 ENCOUNTER — Emergency Department: Payer: Self-pay

## 2019-09-17 ENCOUNTER — Other Ambulatory Visit: Payer: Self-pay

## 2019-09-17 ENCOUNTER — Encounter: Payer: Self-pay | Admitting: Emergency Medicine

## 2019-09-17 ENCOUNTER — Emergency Department
Admission: EM | Admit: 2019-09-17 | Discharge: 2019-09-17 | Disposition: A | Discharge status: Home / Self Care | Payer: Self-pay | Attending: Emergency Medicine | Admitting: Emergency Medicine

## 2019-09-17 DIAGNOSIS — F17210 Nicotine dependence, cigarettes, uncomplicated: Secondary | ICD-10-CM | POA: Insufficient documentation

## 2019-09-17 DIAGNOSIS — M5432 Sciatica, left side: Secondary | ICD-10-CM

## 2019-09-17 DIAGNOSIS — M5430 Sciatica, unspecified side: Secondary | ICD-10-CM | POA: Insufficient documentation

## 2019-09-17 MED ORDER — PREDNISONE 10 MG PO TABS: ORAL_TABLET | ORAL | 0 refills | Status: DC

## 2019-09-17 NOTE — ED Triage Notes (Signed)
Pt c/o left hip pain for the past couple of day, denies injury.

## 2019-09-17 NOTE — Discharge Instructions (Signed)
Follow-up with Dr. Posey Pronto who is the orthopedist on call if any continued problems.  You may use ice or heat to your hip and back as needed for discomfort.  Begin taking prednisone today and tapering down each day by 1 tablet.  If any continued problems or not improving you should consider seeing the orthopedist as well.

## 2019-09-17 NOTE — ED Provider Notes (Signed)
[facility] Emergency Department Provider Note   ____________________________________________   First MD Initiated Contact with Patient 09/17/19 [id number]     (approximate)  I have reviewed the triage vital signs and the nursing notes.   HISTORY  Chief Complaint Hip Pain   HPI Kristen Haney is a 28 y.o. female presents to the ED with complaint of left hip pain for the last 2 days.  Patient denies any injury.  She states that she walked in the door from work at home when her hip began hurting.  She has tried over-the-counter medications without any relief.  Patient states that 1 year ago she had sciatica but was not treated due to her pregnancy.  She states this is similar to the sensation that she had been.  Currently she rates her pain as a 10/10.         Past Medical History:  Diagnosis Date  . Abscesses of both axillae   . Depression   . Hx of migraines   . Hx: UTI (urinary tract infection)   . Medical history non-contributory     Patient Active Problem List   Diagnosis Date Noted  . PTSD (post-traumatic stress disorder) 06/14/2019  . Major depressive disorder, recurrent severe without psychotic features ([place]) 06/14/2019  . Intentional drug overdose ([place])   . Sickle cell trait [facility])     Past Surgical History:  Procedure Laterality Date  . NONE      Prior to Admission medications   Medication Sig Start Date End Date Taking? Authorizing Provider  escitalopram (LEXAPRO) 5 MG tablet Take 5 mg by mouth daily.   Yes [provider]  predniSONE (DELTASONE) 10 MG tablet Take 6 tablets  today, on day 2 take 5 tablets, day 3 take 4 tablets, day 4 take 3 tablets, day 5 take  2 tablets and 1 tablet the last day 09/17/19   Johnn Hai, PA-C    Allergies Patient has no known allergies.  Family History  Problem Relation Age of Onset  . Hypertension Mother   . Cancer Maternal Aunt        PANCREATIC  . Stroke Paternal Grandmother   .  Asthma Son     Social History Social History   Tobacco Use  . Smoking status: Current Every Day Smoker    Packs/day: 0.50    Types: Cigarettes  . Smokeless tobacco: Never Used  Substance Use Topics  . Alcohol use: No  . Drug use: Yes    Types: Marijuana    Review of Systems Constitutional: No fever/chills Eyes: No visual changes. Cardiovascular: Denies chest pain. Respiratory: Denies shortness of breath. Gastrointestinal: No abdominal pain.  No nausea, no vomiting.  Genitourinary: Negative for dysuria. Musculoskeletal: Left leg radiculopathy.  Negative for back pain. Skin: Negative for rash. Neurological: Negative for headaches, focal weakness or numbness. ___________________________________________   PHYSICAL EXAM:  VITAL SIGNS: ED Triage Vitals  Enc Vitals Group     BP 09/17/19 0949 116/64     Pulse Rate 09/17/19 0949 63     Resp 09/17/19 0949 16     Temp 09/17/19 0949 98 F (36.7 C)     Temp Source 09/17/19 0949 Oral     SpO2 09/17/19 0949 99 %     Weight 09/17/19 0950 170 lb (77.1 kg)     Height 09/17/19 0950 5\' 8"  (1.727 m)     Head Circumference --      Peak Flow --  Pain Score 09/17/19 0949 10     Pain Loc --      Pain Edu? --      Excl. in GC? --    Constitutional: Alert and oriented. Well appearing and in no acute distress. Eyes: Conjunctivae are normal.  Head: Atraumatic. Neck: No stridor.   Cardiovascular: Normal rate, regular rhythm. Grossly normal heart sounds.  Good peripheral circulation.  Respiratory: Normal respiratory effort.  No retractions. Lungs CTAB. Musculoskeletal: On examination of the back there is no gross deformity no point tenderness on palpation of the thoracic or lumbar spine.  There is however moderate tenderness on palpation of the left SI joint area.  Left hip no gross deformity and range of motion is restricted secondary to discomfort.  No soft tissue injury or discoloration is noted.  Motor sensory function intact  distally. Neurologic:  Normal speech and language. No gross focal neurologic deficits are appreciated.  Skin:  Skin is warm, dry and intact. No rash noted. Psychiatric: Mood and affect are normal. Speech and behavior are normal.  ____________________________________________   LABS (all labs ordered are listed, but only abnormal results are displayed)  Labs Reviewed - No data to display RADIOLOGY  Official radiology report(s): Dg Lumbar Spine 2-3 Views  Result Date: 09/17/2019 CLINICAL DATA:  Low back pain EXAM: LUMBAR SPINE - 2-3 VIEW COMPARISON:  None. FINDINGS: Frontal, lateral, and spot lumbosacral lateral images were obtained. There are 5 non-rib-bearing lumbar type vertebral bodies. There is slight lumbar dextroscoliosis. There is no fracture or spondylolisthesis. Disc spaces appear unremarkable. No erosive change. IMPRESSION: Slight scoliosis. No fracture or spondylolisthesis. No evident arthropathy. Electronically Signed   By: Bretta BangWilliam  Woodruff III M.D.   On: 09/17/2019 11:16   Dg Hip Unilat W Or Wo Pelvis 2-3 Views Left  Result Date: 09/17/2019 CLINICAL DATA:  Pain EXAM: DG HIP (WITH OR WITHOUT PELVIS) 2-3V LEFT COMPARISON:  None. FINDINGS: Frontal pelvis as well as frontal and lateral left hip images were obtained. No fracture or dislocation. Joint spaces appear normal. No erosive change. There is an apparent small bone island in the medial left superior iliac crest. IMPRESSION: No fracture or dislocation.  No evident arthropathy. Electronically Signed   By: Bretta BangWilliam  Woodruff III M.D.   On: 09/17/2019 11:15    ____________________________________________   PROCEDURES  Procedure(s) performed (including Critical Care):  Procedures   ____________________________________________   INITIAL IMPRESSION / ASSESSMENT AND PLAN / ED COURSE  As part of my medical decision making, I reviewed the following data within the electronic MEDICAL RECORD NUMBER Notes from prior ED visits and [place]  Controlled Substance Database   28 year old female presents to the ED with complaint of left lower extremity radiculopathy that started 2 days ago.  Patient denies any injury and describes the radiation similar to sciatica.  Patient had sciatica 1 year ago when she was pregnant and at that time was not treated due to the pregnancy.  She denies any injury to her back or hip.  X-rays were reassuring to the patient and she was discharged with a prescription for a tapering dose of prednisone.  Patient is to follow-up with her PCP as needed or orthopedist if continued problems.  ____________________________________________   FINAL CLINICAL IMPRESSION(S) / ED DIAGNOSES  Final diagnoses:  Sciatica of left side     ED Discharge Orders         Ordered    predniSONE (DELTASONE) 10 MG tablet     09/17/19 1126  Note:  This document was prepared using Dragon voice recognition software and may include unintentional dictation errors.    Tommi Rumps, PA-C 09/17/19 1140    Arnaldo Natal, MD 09/17/19 [phone]

## 2019-09-17 NOTE — ED Notes (Signed)
See triage note  Presents with left hip pain for 2 days  Stats pain starts posterior hip and radiates into groin and anterior leg  Unable to bear any wt  Denies any injury

## 2020-01-07 ENCOUNTER — Emergency Department: Payer: Self-pay

## 2020-01-07 ENCOUNTER — Emergency Department
Admission: EM | Admit: 2020-01-07 | Discharge: 2020-01-07 | Disposition: A | Discharge status: Home / Self Care | Payer: Self-pay | Attending: Emergency Medicine | Admitting: Emergency Medicine

## 2020-01-07 ENCOUNTER — Other Ambulatory Visit: Payer: Self-pay

## 2020-01-07 ENCOUNTER — Encounter: Payer: Self-pay | Admitting: Emergency Medicine

## 2020-01-07 ENCOUNTER — Telehealth: Payer: Self-pay | Admitting: Pharmacy Technician

## 2020-01-07 DIAGNOSIS — J069 Acute upper respiratory infection, unspecified: Secondary | ICD-10-CM | POA: Insufficient documentation

## 2020-01-07 DIAGNOSIS — F17210 Nicotine dependence, cigarettes, uncomplicated: Secondary | ICD-10-CM | POA: Insufficient documentation

## 2020-01-07 DIAGNOSIS — Z20822 Contact with and (suspected) exposure to covid-19: Secondary | ICD-10-CM | POA: Insufficient documentation

## 2020-01-07 LAB — INFLUENZA PANEL BY PCR (TYPE A & B)
Influenza A By PCR: NEGATIVE
Influenza B By PCR: NEGATIVE

## 2020-01-07 MED ORDER — PREDNISONE 10 MG PO TABS: ORAL_TABLET | ORAL | 0 refills | Status: DC

## 2020-01-07 MED ORDER — AZITHROMYCIN 250 MG PO TABS: ORAL_TABLET | ORAL | 0 refills | Status: DC

## 2020-01-07 MED ORDER — BENZONATATE 100 MG PO CAPS: 100.0000 mg | ORAL_CAPSULE | Freq: Three times a day (TID) | ORAL | 0 refills | Status: DC | PRN

## 2020-01-07 NOTE — ED Provider Notes (Signed)
[facility] Emergency Department Provider Note  ____________________________________________  Time seen: Approximately 2:22 PM  I have reviewed the triage vital signs and the nursing notes.   HISTORY  Chief Complaint No chief complaint on file.    HPI Kristen Haney is a 29 y.o. female that presents to the emergency department for evaluation of chills, body aches, nasal congestion, nonproductive cough, shortness of breath for 1 week.  Patient has 4 coworkers at [employer] with COVID-19.  She went and got tested on Monday and tested negative for Covid.  Symptoms have not improved.  Patient is not on any hormonal oral contraceptives.  She smokes 2 packs of cigarettes per day.   Past Medical History:  Diagnosis Date  . Abscesses of both axillae   . Depression   . Hx of migraines   . Hx: UTI (urinary tract infection)   . Medical history non-contributory     Patient Active Problem List   Diagnosis Date Noted  . PTSD (post-traumatic stress disorder) 06/14/2019  . 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 Intentional drug overdose ([facility])   . Sickle cell trait [facility])     Past Surgical History:  Procedure Laterality Date  . NONE      Prior to Admission medications   Medication Sig Start Date End Date Taking? Authorizing Provider  azithromycin (ZITHROMAX Z-PAK) 250 MG tablet Take 2 tablets (500 mg) on  Day 1,  followed by 1 tablet (250 mg) once daily on Days 2 through 5. 01/07/20   Enid Derry, PA-C  benzonatate (TESSALON PERLES) 100 MG capsule Take 1 capsule (100 mg total) by mouth 3 (three) times daily as needed. 01/07/20 01/06/21  Enid Derry, PA-C  escitalopram (LEXAPRO) 5 MG tablet Take 5 mg by mouth daily.    [provider]  predniSONE (DELTASONE) 10 MG tablet Take 6 tablets on day 1, take 5 tablets on day 2, take 4 tablets on day 3, take 3 tablets on day 4, take 2 tablets on day 5, take 1 tablet  on day 6 01/07/20   Enid Derry, PA-C    Allergies Patient has no known allergies.  Family History  Problem Relation Age of Onset  . Hypertension Mother   . Cancer Maternal Aunt        PANCREATIC  . Stroke Paternal Grandmother   . Asthma Son     Social History Social History   Tobacco Use  . Smoking status: Current Every Day Smoker    Packs/day: 0.50    Types: Cigarettes  . Smokeless tobacco: Never Used  Substance Use Topics  . Alcohol use: No  . Drug use: Yes    Types: Marijuana     Review of Systems  Constitutional: No fever/chills Eyes: No visual changes. No discharge. ENT: Positive for congestion and rhinorrhea. Cardiovascular: No chest pain. Respiratory: Positive for cough. No SOB. Gastrointestinal: No abdominal pain.  No nausea, no vomiting.  No diarrhea.  No constipation. Musculoskeletal: Negative for musculoskeletal pain. Skin: Negative for rash, abrasions, lacerations, ecchymosis. Neurological: Negative for headaches.   ____________________________________________   PHYSICAL EXAM:  VITAL SIGNS: ED Triage Vitals  Enc Vitals Group     BP 01/07/20 1345 102/62     Pulse Rate 01/07/20 1345 60     Resp 01/07/20 1345 16     Temp 01/07/20 1345 98.7 F (37.1 C)     Temp Source 01/07/20 1345 Oral     SpO2 01/07/20 1345 99 %  Weight 01/07/20 1343 169 lb 15.6 oz (77.1 kg)     Height 01/07/20 1343 5\' 8"  (1.727 m)     Head Circumference --      Peak Flow --      Pain Score 01/07/20 1343 8     Pain Loc --      Pain Edu? --      Excl. in GC? --      Constitutional: Alert and oriented. Well appearing and in no acute distress. Eyes: Conjunctivae are normal. PERRL. EOMI. No discharge. Head: Atraumatic. ENT: No frontal and maxillary sinus tenderness.      Ears: Tympanic membranes pearly gray with good landmarks. No discharge.      Nose: Mild congestion/rhinnorhea.      Mouth/Throat: Mucous membranes are moist. Oropharynx non-erythematous. Tonsils not  enlarged. No exudates. Uvula midline. Neck: No stridor.   Hematological/Lymphatic/Immunilogical: No cervical lymphadenopathy. Cardiovascular: Normal rate, regular rhythm.  Good peripheral circulation. Respiratory: Normal respiratory effort without tachypnea or retractions. Lungs CTAB. Good air entry to the bases with no decreased or absent breath sounds. Gastrointestinal: Bowel sounds 4 quadrants. Soft and nontender to palpation. No guarding or rigidity. No palpable masses. No distention. Musculoskeletal: Full range of motion to all extremities. No gross deformities appreciated. Neurologic:  Normal speech and language. No gross focal neurologic deficits are appreciated.  Skin:  Skin is warm, dry and intact. No rash noted. Psychiatric: Mood and affect are normal. Speech and behavior are normal. Patient exhibits appropriate insight and judgement.   ____________________________________________   LABS (all labs ordered are listed, but only abnormal results are displayed)  Labs Reviewed  SARS CORONAVIRUS 2 (TAT 6-24 HRS)  INFLUENZA PANEL BY PCR (TYPE A & B)   ____________________________________________  EKG  SB ____________________________________________  RADIOLOGY 03/06/20, personally viewed and evaluated these images (plain radiographs) as part of my medical decision making, as well as reviewing the written report by the radiologist.  DG Chest Portable 1 View  Result Date: 01/07/2020 CLINICAL DATA:  Cough; shortness of breath. EXAM: PORTABLE CHEST 1 VIEW COMPARISON:  None. FINDINGS: Lungs are clear. Heart size and pulmonary vascularity are normal. No adenopathy. No bone lesions. IMPRESSION: No abnormality noted. Electronically Signed   By: 03/06/2020 III M.D.   On: 01/07/2020 15:06    ____________________________________________    PROCEDURES  Procedure(s) performed:    Procedures    Medications - No data to  display   ____________________________________________   INITIAL IMPRESSION / ASSESSMENT AND PLAN / ED COURSE  Pertinent labs & imaging results that were available during my care of the patient were reviewed by me and considered in my medical decision making (see chart for details).  Review of the [place] CSRS was performed in accordance of the [facility] prior to dispensing any controlled drugs.   Patient's diagnosis is consistent with URI. Vital signs and exam are reassuring.  Chest x-ray negative for acute cardiopulmonary processes.  Chest x-ray shows sinus bradycardia.  Covid and influenza tests are pending.  Patient appears well and is staying well hydrated. Patient should alternate tylenol and ibuprofen for fever. Patient feels comfortable going home. Patient will be discharged home with prescriptions for azithromycin and prednisone. Patient is to follow up with primary care as needed or otherwise directed. Patient is given ED precautions to return to the ED for any worsening or new symptoms.   Kristen Haney was evaluated in Emergency Department on 01/07/2020 for the symptoms described in the history of present illness. She  was evaluated in the context of the global COVID-19 pandemic, which necessitated consideration that the patient might be at risk for infection with the SARS-CoV-2 virus that causes COVID-19. Institutional protocols and algorithms that pertain to the evaluation of patients at risk for COVID-19 are in a state of rapid change based on information released by regulatory bodies including the CDC and federal and state organizations. These policies and algorithms were followed during the patient's care in the ED.  ____________________________________________  FINAL CLINICAL IMPRESSION(S) / ED DIAGNOSES  Final diagnoses:  Upper respiratory tract infection, unspecified type      NEW MEDICATIONS STARTED DURING THIS VISIT:  ED Discharge Orders         Ordered    azithromycin  (ZITHROMAX Z-PAK) 250 MG tablet     01/07/20 1515    predniSONE (DELTASONE) 10 MG tablet     01/07/20 1515    benzonatate (TESSALON PERLES) 100 MG capsule  3 times daily PRN     01/07/20 1517              This chart was dictated using voice recognition software/Dragon. Despite best efforts to proofread, errors can occur which can change the meaning. Any change was purely unintentional.    Laban Emperor, PA-C 01/07/20 1519    Earleen Newport, MD 01/07/20 [phone]

## 2020-01-07 NOTE — Telephone Encounter (Signed)
Patient failed to provide requested 2020 financial documentation. No additional medication assistance will be provided by [facility] without the required proof of income documentation. Patient notified by letter  Rondal Vandevelde CPhT [facility] 

## 2020-01-07 NOTE — ED Triage Notes (Signed)
C/O bodyaches, chills, weakness, diarrhea, runny nose, cough x 1 week.  AAOx3.  Skin warm and dry. NAD

## 2020-01-08 LAB — SARS CORONAVIRUS 2 (TAT 6-24 HRS): SARS Coronavirus 2: NEGATIVE

## 2020-03-01 ENCOUNTER — Emergency Department: Payer: Medicaid Other

## 2020-03-01 ENCOUNTER — Emergency Department
Admission: EM | Admit: 2020-03-01 | Discharge: 2020-03-01 | Disposition: A | Discharge status: Home / Self Care | Payer: Medicaid Other | Attending: Emergency Medicine | Admitting: Emergency Medicine

## 2020-03-01 ENCOUNTER — Other Ambulatory Visit: Payer: Self-pay

## 2020-03-01 DIAGNOSIS — N3000 Acute cystitis without hematuria: Secondary | ICD-10-CM

## 2020-03-01 DIAGNOSIS — M25541 Pain in joints of right hand: Secondary | ICD-10-CM

## 2020-03-01 DIAGNOSIS — F17210 Nicotine dependence, cigarettes, uncomplicated: Secondary | ICD-10-CM | POA: Insufficient documentation

## 2020-03-01 DIAGNOSIS — Z79899 Other long term (current) drug therapy: Secondary | ICD-10-CM | POA: Insufficient documentation

## 2020-03-01 LAB — URINALYSIS, COMPLETE (UACMP) WITH MICROSCOPIC
Bilirubin Urine: NEGATIVE
Glucose, UA: NEGATIVE mg/dL
Hgb urine dipstick: NEGATIVE
Ketones, ur: NEGATIVE mg/dL
Leukocytes,Ua: NEGATIVE
Nitrite: NEGATIVE
Protein, ur: NEGATIVE mg/dL
Specific Gravity, Urine: 1.011 (ref 1.005–1.030)
pH: 7 (ref 5.0–8.0)

## 2020-03-01 LAB — POCT PREGNANCY, URINE: Preg Test, Ur: NEGATIVE

## 2020-03-01 MED ORDER — NAPROXEN 500 MG PO TABS
500.0000 mg | ORAL_TABLET | Freq: Once | ORAL | Status: AC
  Administered 2020-03-01: 16:00:00 500 mg via ORAL
  Filled 2020-03-01: qty 1

## 2020-03-01 MED ORDER — NABUMETONE 750 MG PO TABS: 750.0000 mg | ORAL_TABLET | Freq: Two times a day (BID) | ORAL | 0 refills | Status: AC

## 2020-03-01 MED ORDER — CEPHALEXIN 500 MG PO CAPS: 500.0000 mg | ORAL_CAPSULE | Freq: Three times a day (TID) | ORAL | 0 refills | Status: AC

## 2020-03-01 MED ORDER — CEPHALEXIN 500 MG PO CAPS
500.0000 mg | ORAL_CAPSULE | Freq: Once | ORAL | Status: AC
  Administered 2020-03-01: 500 mg via ORAL
  Filled 2020-03-01: qty 1

## 2020-03-01 NOTE — ED Notes (Addendum)
See triage note  Presents with pain to 3rd and 4th fingers of right hand  States pain started abotu 2 weeks ago w/o known injury  Also having some urinary freq

## 2020-03-01 NOTE — Discharge Instructions (Addendum)
Your exam and XR are negative for any fracture or arthritis to the fingers. You are also being treated for a bladder infection. Take the anti-inflammatory and antibiotic as directed. Drink plenty of water or non-carbonated drinks, and empty your bladder on schedule. Follow-up with your provider or Ortho as discussed.

## 2020-03-01 NOTE — ED Triage Notes (Signed)
Pt c/o pain to the right 4th and 5th finger for the past 2 weeks, denies injury. No noted swelling or redness.

## 2020-03-01 NOTE — ED Provider Notes (Signed)
[facility] Emergency Department Provider Note ____________________________________________  Time seen: 1501  I have reviewed the triage vital signs and the nursing notes.  HISTORY  Chief Complaint  Finger pain  HPI Kristen Haney is a 29 y.o. right-handed female presents as up to the ED for 2 weeks management pain to the third and fourth fingers of the right hand.  Patient denies any preceding injury, trauma, or accident.  She describes pain and stiffness to the proximal knuckles of the 2 middle fingers.  She describes tightness and stiffness with attempted range of motion.  She denies any distal paresthesias, grip changes, weakness patient also denies any cyanosis, clubbing, edema, erythema, or temperature changes to the hands.  She denies any history of arthritis.  She has been using Tylenol and applying Epson salt soaks without benefit.   She has secondary complaint of urinary frequency without retention or hematuria.   Past Medical History:  Diagnosis Date  . Abscesses of both axillae   . Depression   . Hx of migraines   . Hx: UTI (urinary tract infection)   . Medical history non-contributory     Patient Active Problem List   Diagnosis Date Noted  . PTSD (post-traumatic stress disorder) 06/14/2019  . 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 Intentional drug overdose ([facility])   . Sickle cell trait [facility])     Past Surgical History:  Procedure Laterality Date  . NONE      Prior to Admission medications   Medication Sig Start Date End Date Taking? Authorizing Provider  cephALEXin (KEFLEX) 500 MG capsule Take 1 capsule (500 mg total) by mouth 3 (three) times daily for 7 days. 03/01/20 03/08/20  Esmond Hinch, Charlesetta Ivory, PA-C  escitalopram (LEXAPRO) 5 MG tablet Take 5 mg by mouth daily.    [provider]  nabumetone (RELAFEN) 750 MG tablet Take 1 tablet (750 mg total) by mouth 2 (two) times daily for 20  days. 03/01/20 03/21/20  Skyrah Krupp, Charlesetta Ivory, PA-C    Allergies Patient has no known allergies.  Family History  Problem Relation Age of Onset  . Hypertension Mother   . Cancer Maternal Aunt        PANCREATIC  . Stroke Paternal Grandmother   . Asthma Son     Social History Social History   Tobacco Use  . Smoking status: Current Every Day Smoker    Packs/day: 0.50    Types: Cigarettes  . Smokeless tobacco: Never Used  Substance Use Topics  . Alcohol use: No  . Drug use: Yes    Types: Marijuana    Review of Systems  Constitutional: Negative for fever. Cardiovascular: Negative for chest pain. Respiratory: Negative for shortness of breath. Musculoskeletal: Negative for back pain.  Right hand pain as above. GU: reports urinary frequency.  Skin: Negative for rash. Neurological: Negative for headaches, focal weakness or numbness. ____________________________________________  PHYSICAL EXAM:  VITAL SIGNS: ED Triage Vitals  Enc Vitals Group     BP 03/01/20 1428 126/78     Pulse Rate 03/01/20 1428 (!) 103     Resp 03/01/20 1428 16     Temp 03/01/20 1428 98.7 F (37.1 C)     Temp Source 03/01/20 1428 Oral     SpO2 03/01/20 1428 98 %     Weight --      Height --      Head Circumference --      Peak Flow --  Pain Score 03/01/20 1420 5     Pain Loc --      Pain Edu? --      Excl. in [place]? --     Constitutional: Alert and oriented. Well appearing and in no distress. Head: Normocephalic and atraumatic. Eyes: Conjunctivae are normal. Normal extraocular movements Cardiovascular: Normal rate, regular rhythm. Normal distal pulses. Respiratory: Normal respiratory effort.  Musculoskeletal: Right hand without obvious deformity, dislocation, or effusion.  Patient with normal composite fist on the right.  She is tender to palpation over the PIPs of the third and fourth digits.  Nontender with normal range of motion in all extremities.  Neurologic: Normal gross sensation.   Normal intrinsic and opposition testing noted.  Normal gait without ataxia. Normal speech and language. No gross focal neurologic deficits are appreciated. Skin:  Skin is warm, dry and intact. No rash noted.  No CCE distally. ____________________________________________   RADIOLOGY  DG Right Hand  Negative  I, Ryian Lynde V Bacon-Lynden Flemmer, personally viewed and evaluated these images (plain radiographs) as part of my medical decision making, as well as reviewing the written report by the radiologist.  ______________________________________________  LABORATORY  Labs Reviewed  URINALYSIS, COMPLETE (UACMP) WITH MICROSCOPIC - Abnormal; Notable for the following components:      Result Value   Color, Urine YELLOW (*)    APPearance CLEAR (*)    Bacteria, UA MANY (*)    All other components within normal limits  POC URINE PREG, ED  POCT PREGNANCY, URINE  ____________________________________________  PROCEDURES  Naproxen 500 mg PO Keflex 500 mg PO Procedures ____________________________________________  INITIAL IMPRESSION / ASSESSMENT AND PLAN / ED COURSE  Patient with ED evaluation of a 2-week complaint of intermittent pain to the PIP joints of the 34th digits of the right hand.  No prior traumas reported.  Exam as above evaluation.  X-ray did not reveal any acute fracture, dislocation, or arthropathy.  Patient was treated with buddy tape and anti-inflammatories for presumed joints sprain and mild tendinitis.  She is also complaining of some urinary frequency and dysuria.  Patient's UA does reveal bacteriuria as well as a moderate sized area.  She will be treated with Keflex for cystitis.  She is advised to follow-up with a primary provider should symptoms return or worsen.  She is given a referral to orthopedics for her hand pain.  Fingers are buddy taped and she will follow-up as discussed.  Kristen Haney was evaluated in Emergency Department on 03/01/2020 for the symptoms described in the  history of present illness. She was evaluated in the context of the global COVID-19 pandemic, which necessitated consideration that the patient might be at risk for infection with the SARS-CoV-2 virus that causes COVID-19. Institutional protocols and algorithms that pertain to the evaluation of patients at risk for COVID-19 are in a state of rapid change based on information released by regulatory bodies including the CDC and federal and state organizations. These policies and algorithms were followed during the patient's care in the ED. ____________________________________________  FINAL CLINICAL IMPRESSION(S) / ED DIAGNOSES  Final diagnoses:  Arthralgia of right hand  Acute cystitis without hematuria      Melvenia Needles, PA-C 03/01/20 1827    Blake Divine, MD 03/01/20 Curly Rim

## 2020-04-24 ENCOUNTER — Encounter: Payer: Self-pay | Admitting: Emergency Medicine

## 2020-04-24 ENCOUNTER — Other Ambulatory Visit: Payer: Self-pay

## 2020-04-24 ENCOUNTER — Emergency Department
Admission: EM | Admit: 2020-04-24 | Discharge: 2020-04-24 | Disposition: A | Discharge status: Home / Self Care | Payer: Medicaid Other | Attending: Emergency Medicine | Admitting: Emergency Medicine

## 2020-04-24 DIAGNOSIS — F17210 Nicotine dependence, cigarettes, uncomplicated: Secondary | ICD-10-CM | POA: Insufficient documentation

## 2020-04-24 DIAGNOSIS — L732 Hidradenitis suppurativa: Secondary | ICD-10-CM | POA: Insufficient documentation

## 2020-04-24 MED ORDER — HYDROCODONE-ACETAMINOPHEN 5-325 MG PO TABS
1.0000 | ORAL_TABLET | Freq: Once | ORAL | Status: AC
  Administered 2020-04-24: 1 via ORAL
  Filled 2020-04-24: qty 1

## 2020-04-24 MED ORDER — SULFAMETHOXAZOLE-TRIMETHOPRIM 800-160 MG PO TABS
1.0000 | ORAL_TABLET | Freq: Once | ORAL | Status: AC
  Administered 2020-04-24: 23:00:00 1 via ORAL
  Filled 2020-04-24: qty 1

## 2020-04-24 MED ORDER — HYDROCODONE-ACETAMINOPHEN 5-325 MG PO TABS: 1.0000 | ORAL_TABLET | ORAL | 0 refills | Status: DC | PRN

## 2020-04-24 MED ORDER — SULFAMETHOXAZOLE-TRIMETHOPRIM 800-160 MG PO TABS: 1.0000 | ORAL_TABLET | Freq: Two times a day (BID) | ORAL | 0 refills | Status: DC

## 2020-04-24 NOTE — ED Notes (Signed)
Pt given water at this time and warm blanket

## 2020-04-24 NOTE — ED Triage Notes (Signed)
Patient with complaint of abscess in the right axillary times two days.

## 2020-04-24 NOTE — ED Provider Notes (Signed)
[facility] Emergency Department Provider Note  ____________________________________________  Time seen: Approximately 11:11 PM  I have reviewed the triage vital signs and the nursing notes.   HISTORY  Chief Complaint Abscess    HPI Kristen Haney is a 29 y.o. female who presents the emergency department complaining of recurrent Haney in the right axilla.  Patient reports 2 areas in the right axilla.  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  Symptoms have been ongoing x4 days.  Patient has never followed up with dermatology or been diagnosed with hidradenitis suppurativa.  No medications prior to arrival for this condition.  No other complaints at this time.  Patient has a history of depression, UTI, PTSD, depression, sickle cell trait.         Past Medical History:  Diagnosis Date  . Haney of both axillae   . Depression   . Hx of migraines   . Hx: UTI (urinary tract infection)   . Medical history non-contributory     Patient Active Problem List   Diagnosis Date Noted  . PTSD (post-traumatic stress disorder) 06/14/2019  . 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 Intentional drug overdose ([place])   . Sickle cell trait [facility])     Past Surgical History:  Procedure Laterality Date  . NONE    . TUBAL LIGATION      Prior to Admission medications   Medication Sig Start Date End Date Taking? Authorizing Provider  escitalopram (LEXAPRO) 5 MG tablet Take 5 mg by mouth daily.    [provider]  HYDROcodone-acetaminophen (NORCO/VICODIN) 5-325 MG tablet Take 1 tablet by mouth every 4 (four) hours as needed for moderate pain. 04/24/20   Paulanthony Gleaves, Charline Bills, PA-C  sulfamethoxazole-trimethoprim (BACTRIM DS) 800-160 MG tablet Take 1 tablet by mouth 2 (two) times daily. 04/24/20   Raidyn Breiner, Charline Bills, PA-C    Allergies Patient has no known allergies.  Family History   Problem Relation Age of Onset  . Hypertension Mother   . Cancer Maternal Aunt        PANCREATIC  . Stroke Paternal Grandmother   . Asthma Son     Social History Social History   Tobacco Use  . Smoking status: Current Every Day Smoker    Packs/day: 0.50    Types: Cigarettes  . Smokeless tobacco: Never Used  Substance Use Topics  . Alcohol use: No  . Drug use: Not Currently    Types: Marijuana     Review of Systems  Constitutional: No fever/chills Eyes: No visual changes. No discharge ENT: No upper respiratory complaints. Cardiovascular: no chest pain. Respiratory: no cough. No SOB. Gastrointestinal: No abdominal pain.  No nausea, no vomiting.  No diarrhea.  No constipation. Musculoskeletal: Negative for musculoskeletal pain. Skin: Recurrent Haney in the right axilla Neurological: Negative for headaches, focal weakness or numbness. 10-point ROS otherwise negative.  ____________________________________________   PHYSICAL EXAM:  VITAL SIGNS: ED Triage Vitals  Enc Vitals Group     BP 04/24/20 2103 117/72     Pulse Rate 04/24/20 2103 97     Resp 04/24/20 2103 16     Temp 04/24/20 2103 99.3 F (37.4 C)     Temp Source 04/24/20 2103 Oral     SpO2 04/24/20 2103 96 %     Weight 04/24/20 2109 170 lb (77.1 kg)     Height 04/24/20 2109 5\' 8"  (1.727 m)     Head Circumference --  Peak Flow --      Pain Score 04/24/20 2109 10     Pain Loc --      Pain Edu? --      Excl. in GC? --      Constitutional: Alert and oriented. Well appearing and in no acute distress. Eyes: Conjunctivae are normal. PERRL. EOMI. Head: Atraumatic. ENT:      Ears:       Nose: No congestion/rhinnorhea.      Mouth/Throat: Mucous membranes are moist.  Neck: No stridor.   Hematological/Lymphatic/Immunilogical: No cervical lymphadenopathy. Cardiovascular: Normal rate, regular rhythm. Normal S1 and S2.  Good peripheral circulation. Respiratory: Normal respiratory effort without  tachypnea or retractions. Lungs CTAB. Good air entry to the bases with no decreased or absent breath sounds. Musculoskeletal: Full range of motion to all extremities. No gross deformities appreciated. Neurologic:  Normal speech and language. No gross focal neurologic deficits are appreciated.  Skin:  Skin is warm, dry and intact. No rash noted.  Visualization of the right axilla reveals 2 erythematous and edematous lesions.  These are nonfluctuant in nature.  No gross surrounding erythema or edema.  Findings, history are consistent with hidradenitis suppurativa.  No regional lymphadenopathy. Psychiatric: Mood and affect are normal. Speech and behavior are normal. Patient exhibits appropriate insight and judgement.   ____________________________________________   LABS (all labs ordered are listed, but only abnormal results are displayed)  Labs Reviewed - No data to display ____________________________________________  EKG   ____________________________________________  RADIOLOGY   No results found.  ____________________________________________    PROCEDURES  Procedure(s) performed:    Procedures    Medications  sulfamethoxazole-trimethoprim (BACTRIM DS) 800-160 MG per tablet 1 tablet (has no administration in time range)  HYDROcodone-acetaminophen (NORCO/VICODIN) 5-325 MG per tablet 1 tablet (has no administration in time range)     ____________________________________________   INITIAL IMPRESSION / ASSESSMENT AND PLAN / ED COURSE  Pertinent labs & imaging results that were available during my care of the patient were reviewed by me and considered in my medical decision making (see chart for details).  Review of the [place] CSRS was performed in accordance of the [facility] prior to dispensing any controlled drugs.           Patient's diagnosis is consistent with hidradenitis suppurativa.  Patient presented to the emergency department the complaint of 2 Haney in the  right axilla.  2 findings consistent with cellulitic changes with no appreciable underlying abscess requiring incision and drainage.  No evidence for imaging or labs.  Patient will be treated with Bactrim, Norco for symptom relief.  Patient is instructed to follow-up with dermatology..  Patient is given ED precautions to return to the ED for any worsening or new symptoms.     ____________________________________________  FINAL CLINICAL IMPRESSION(S) / ED DIAGNOSES  Final diagnoses:  Hidradenitis suppurativa      NEW MEDICATIONS STARTED DURING THIS VISIT:  ED Discharge Orders         Ordered    HYDROcodone-acetaminophen (NORCO/VICODIN) 5-325 MG tablet  Every 4 hours PRN     04/24/20 2318    sulfamethoxazole-trimethoprim (BACTRIM DS) 800-160 MG tablet  2 times daily     04/24/20 2318              This chart was dictated using voice recognition software/Dragon. Despite best efforts to proofread, errors can occur which can change the meaning. Any change was purely unintentional.    Racheal Patches, PA-C 04/24/20 2320  Chesley Noon, MD 04/25/20 [phone]

## 2020-12-24 ENCOUNTER — Other Ambulatory Visit: Payer: Self-pay

## 2020-12-24 ENCOUNTER — Emergency Department
Admission: EM | Admit: 2020-12-24 | Discharge: 2020-12-24 | Disposition: A | Discharge status: Home / Self Care | Payer: Medicaid Other | Attending: Emergency Medicine | Admitting: Emergency Medicine

## 2020-12-24 DIAGNOSIS — Z79899 Other long term (current) drug therapy: Secondary | ICD-10-CM | POA: Insufficient documentation

## 2020-12-24 DIAGNOSIS — L02411 Cutaneous abscess of right axilla: Secondary | ICD-10-CM | POA: Diagnosis not present

## 2020-12-24 MED ORDER — DOXYCYCLINE HYCLATE 50 MG PO CAPS: 100.0000 mg | ORAL_CAPSULE | Freq: Two times a day (BID) | ORAL | 0 refills | Status: DC

## 2020-12-24 MED ORDER — KETOROLAC TROMETHAMINE 60 MG/2ML IM SOLN
30.0000 mg | Freq: Once | INTRAMUSCULAR | Status: AC
  Administered 2020-12-24: 30 mg via INTRAMUSCULAR
  Filled 2020-12-24: qty 2

## 2020-12-24 MED ORDER — DOXYCYCLINE HYCLATE 100 MG PO TABS
100.0000 mg | ORAL_TABLET | Freq: Once | ORAL | Status: AC
  Administered 2020-12-24: 03:00:00 100 mg via ORAL
  Filled 2020-12-24: qty 1

## 2020-12-24 MED ORDER — OXYCODONE-ACETAMINOPHEN 5-325 MG PO TABS: 1.0000 | ORAL_TABLET | ORAL | 0 refills | Status: DC | PRN

## 2020-12-24 NOTE — ED Notes (Signed)
Patient gave this RN verbal consent for discharge d/t sign pad not working.

## 2020-12-24 NOTE — Discharge Instructions (Signed)
1.  Take antibiotic as prescribed (doxycycline 100 mg twice daily x7 days). 2.  You may take Ibuprofen as needed for pain; Percocet (#15) as needed for more severe pain. 3.  Apply warm compress several times daily to promote drainage. 4.  Return to the ER for worsening symptoms, persistent vomiting, difficulty breathing or other concerns.

## 2020-12-24 NOTE — ED Provider Notes (Signed)
[facility] Emergency Department Provider Note   ____________________________________________   Event Date/Time   First MD Initiated Contact with Patient 12/24/20 0259     (approximate)  I have reviewed the triage vital signs and the nursing notes.   HISTORY  Chief Complaint Abscess    HPI Kristen Haney is a 29 y.o. female who presents to the ED from home with a chief complaint of abscess.  Patient has a history of abscesses to both axilla.  Reports a 2-day history of increasing abscess which began to drain while in the waiting room.  Denies associated fever/chills, nausea/vomiting.     Past Medical History:  Diagnosis Date  . Abscesses of both axillae   . Depression   . Hx of migraines   . Hx: UTI (urinary tract infection)   . Medical history non-contributory     Patient Active Problem List   Diagnosis Date Noted  . PTSD (post-traumatic stress disorder) 06/14/2019  . 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 Intentional drug overdose ([facility])   . Sickle cell trait [facility])     Past Surgical History:  Procedure Laterality Date  . NONE    . TUBAL LIGATION      Prior to Admission medications   Medication Sig Start Date End Date Taking? Authorizing Provider  doxycycline (VIBRAMYCIN) 50 MG capsule Take 2 capsules (100 mg total) by mouth 2 (two) times daily. 12/24/20   Irean Hong, MD  escitalopram (LEXAPRO) 5 MG tablet Take 5 mg by mouth daily.    [provider]  HYDROcodone-acetaminophen (NORCO/VICODIN) 5-325 MG tablet Take 1 tablet by mouth every 4 (four) hours as needed for moderate pain. 04/24/20   Cuthriell, Delorise Royals, PA-C  oxyCODONE-acetaminophen (PERCOCET/ROXICET) 5-325 MG tablet Take 1 tablet by mouth every 4 (four) hours as needed for severe pain. 12/24/20   Irean Hong, MD  sulfamethoxazole-trimethoprim (BACTRIM DS) 800-160 MG tablet Take 1 tablet by mouth 2 (two) times daily.  04/24/20   Cuthriell, Delorise Royals, PA-C    Allergies Patient has no known allergies.  Family History  Problem Relation Age of Onset  . Hypertension Mother   . Cancer Maternal Aunt        PANCREATIC  . Stroke Paternal Grandmother   . Asthma Son     Social History Social History   Tobacco Use  . Smoking status: Current Every Day Smoker    Packs/day: 0.50    Types: Cigarettes  . Smokeless tobacco: Never Used  Vaping Use  . Vaping Use: Never used  Substance Use Topics  . Alcohol use: No  . Drug use: Not Currently    Types: Marijuana    Review of Systems  Constitutional: No fever/chills Eyes: No visual changes. ENT: No sore throat. Cardiovascular: Denies chest pain. Respiratory: Denies shortness of breath. Gastrointestinal: No abdominal pain.  No nausea, no vomiting.  No diarrhea.  No constipation. Genitourinary: Negative for dysuria. Musculoskeletal: Negative for back pain. Skin: Positive for abscess.  Negative for rash. Neurological: Negative for headaches, focal weakness or numbness.   ____________________________________________   PHYSICAL EXAM:  VITAL SIGNS: ED Triage Vitals [12/24/20 0135]  Enc Vitals Group     BP 108/72     Pulse Rate 95     Resp 16     Temp 98 F (36.7 C)     Temp Source Oral     SpO2 100 %     Weight 172 lb (78 kg)  Height 5\' 8"  (1.727 m)     Head Circumference      Peak Flow      Pain Score 10     Pain Loc      Pain Edu?      Excl. in GC?     Constitutional: Alert and oriented. Well appearing and in no acute distress. Eyes: Conjunctivae are normal. PERRL. EOMI. Head: Atraumatic. Nose: No congestion/rhinnorhea. Mouth/Throat: Mucous membranes are moist.  Oropharynx non-erythematous. Neck: No stridor.   Cardiovascular: Normal rate, regular rhythm. Grossly normal heart sounds.  Good peripheral circulation. Respiratory: Normal respiratory effort.  No retractions. Lungs CTAB. Gastrointestinal: Soft and nontender. No  distention. No abdominal bruits. No CVA tenderness. Musculoskeletal: No lower extremity tenderness nor edema.  No joint effusions. Neurologic:  Normal speech and language. No gross focal neurologic deficits are appreciated. No gait instability. Skin: Right axilla with abscess draining moderate amounts of purulence.  No surrounding warmth or erythema.  Skin is warm, dry and intact. No rash noted. Psychiatric: Mood and affect are normal. Speech and behavior are normal.  ____________________________________________   LABS (all labs ordered are listed, but only abnormal results are displayed)  Labs Reviewed - No data to display ____________________________________________  EKG  None ____________________________________________  RADIOLOGY I, Rhianon Zabawa J, personally viewed and evaluated these images (plain radiographs) as part of my medical decision making, as well as reviewing the written report by the radiologist.  ED MD interpretation: None  Official radiology report(s): No results found.  ____________________________________________   PROCEDURES  Procedure(s) performed (including Critical Care):  Procedures   ____________________________________________   INITIAL IMPRESSION / ASSESSMENT AND PLAN / ED COURSE  As part of my medical decision making, I reviewed the following data within the electronic MEDICAL RECORD NUMBER Nursing notes reviewed and incorporated, Notes from prior ED visits and [place] Controlled Substance Database     29 year old female presenting with draining right axillary abscess.  Further expressed abscess which patient tolerated.  She had Bactrim approximately 1 month ago for a different abscess.  Will start doxycycline, analgesia and patient will follow up with her PCP as needed.  Strict return precautions given.  Patient verbalizes understanding agrees with plan of care.      ____________________________________________   FINAL CLINICAL IMPRESSION(S) / ED  DIAGNOSES  Final diagnoses:  Abscess of axilla, right     ED Discharge Orders         Ordered    doxycycline (VIBRAMYCIN) 50 MG capsule  2 times daily        12/24/20 0304    oxyCODONE-acetaminophen (PERCOCET/ROXICET) 5-325 MG tablet  Every 4 hours PRN        12/24/20 0304          *Please note:  Kristen Haney was evaluated in Emergency Department on 12/24/2020 for the symptoms described in the history of present illness. She was evaluated in the context of the global COVID-19 pandemic, which necessitated consideration that the patient might be at risk for infection with the SARS-CoV-2 virus that causes COVID-19. Institutional protocols and algorithms that pertain to the evaluation of patients at risk for COVID-19 are in a state of rapid change based on information released by regulatory bodies including the CDC and federal and state organizations. These policies and algorithms were followed during the patient's care in the ED.  Some ED evaluations and interventions may be delayed as a result of limited staffing during and the pandemic.*   Note:  This document was prepared using Dragon  voice recognition software and may include unintentional dictation errors.   Irean Hong, MD 12/24/20 [phone]

## 2020-12-24 NOTE — ED Triage Notes (Signed)
Pt with abscess noted to right axilla with purulent drainage. Pt denies known fever. Pt states history of same in past.

## 2021-03-07 ENCOUNTER — Other Ambulatory Visit: Payer: Self-pay

## 2021-03-07 ENCOUNTER — Emergency Department
Admission: EM | Admit: 2021-03-07 | Discharge: 2021-03-07 | Disposition: A | Discharge status: Home / Self Care | Payer: Medicaid Other | Attending: Emergency Medicine | Admitting: Emergency Medicine

## 2021-03-07 ENCOUNTER — Encounter: Payer: Self-pay | Admitting: Emergency Medicine

## 2021-03-07 DIAGNOSIS — F17210 Nicotine dependence, cigarettes, uncomplicated: Secondary | ICD-10-CM | POA: Insufficient documentation

## 2021-03-07 DIAGNOSIS — H60312 Diffuse otitis externa, left ear: Secondary | ICD-10-CM | POA: Diagnosis not present

## 2021-03-07 DIAGNOSIS — H9202 Otalgia, left ear: Secondary | ICD-10-CM | POA: Diagnosis present

## 2021-03-07 MED ORDER — COLY-MYCIN S 3.3-3-10-0.5 MG/ML OT SUSP: 2.0000 [drp] | Freq: Four times a day (QID) | OTIC | 0 refills | Status: AC

## 2021-03-07 MED ORDER — TRAMADOL HCL 50 MG PO TABS
50.0000 mg | ORAL_TABLET | Freq: Four times a day (QID) | ORAL | 0 refills | Status: AC | PRN
Start: 2021-03-07 — End: 2021-03-12

## 2021-03-07 NOTE — Discharge Instructions (Signed)
Follow discharge care instruction take medication as directed.  Apply warm compresses to the area for 5 minutes twice a day.

## 2021-03-07 NOTE — ED Provider Notes (Signed)
[facility] Emergency Department Provider Note   ____________________________________________   Event Date/Time   First MD Initiated Contact with Patient 03/07/21 1031     (approximate)  I have reviewed the triage vital signs and the nursing notes.   HISTORY  Chief Complaint Ear Pain    HPI Kristen Haney is a 30 y.o. female patient complain of left ear pain.  Patient states occasionally gets "boils" in her ear secondary to cleaning.  Patient states she normally ruptured the lesion and the pain goes away patient states she is unable to palpate or rupture any lesions.  Denies hearing loss or vertigo.  Rates pain as a 10/10.  States pain increased with pressure i.e. laying on affected side.         Past Medical History:  Diagnosis Date  . Abscesses of both axillae   . Depression   . Hx of migraines   . Hx: UTI (urinary tract infection)   . Medical history non-contributory     Patient Active Problem List   Diagnosis Date Noted  . PTSD (post-traumatic stress disorder) 06/14/2019  . 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 Intentional drug overdose ([facility])   . Sickle cell trait [facility])     Past Surgical History:  Procedure Laterality Date  . NONE    . TUBAL LIGATION      Prior to Admission medications   Medication Sig Start Date End Date Taking? Authorizing Provider  neomycin-colistin-hydrocortisone-thonzonium (COLY-MYCIN S) 3.03-02-09-0.5 MG/ML OTIC suspension Place 2 drops into both ears 4 (four) times daily for 10 days. 03/07/21 03/17/21 Yes Joni Reining, PA-C  traMADol (ULTRAM) 50 MG tablet Take 1 tablet (50 mg total) by mouth every 6 (six) hours as needed for up to 5 days. 03/07/21 03/12/21 Yes Joni Reining, PA-C  escitalopram (LEXAPRO) 5 MG tablet Take 5 mg by mouth daily.    [provider]    Allergies Patient has no known allergies.  Family History  Problem Relation Age of  Onset  . Hypertension Mother   . Cancer Maternal Aunt        PANCREATIC  . Stroke Paternal Grandmother   . Asthma Son     Social History Social History   Tobacco Use  . Smoking status: Current Every Day Smoker    Packs/day: 0.50    Types: Cigarettes  . Smokeless tobacco: Never Used  Vaping Use  . Vaping Use: Never used  Substance Use Topics  . Alcohol use: No  . Drug use: Not Currently    Types: Marijuana    Review of Systems  Constitutional: No fever/chills Eyes: No visual changes. ENT: No sore throat. EARS; left ear pain. Cardiovascular: Denies chest pain. Respiratory: Denies shortness of breath. Gastrointestinal: No abdominal pain.  No nausea, no vomiting.  No diarrhea.  No constipation. Genitourinary: Negative for dysuria. Musculoskeletal: Negative for back pain. Skin: Negative for rash. Neurological: Negative for headaches, focal weakness or numbness. Psychiatric:  Depression, PTSD, intentional overdose. ____________________________________________   PHYSICAL EXAM:  VITAL SIGNS: ED Triage Vitals  Enc Vitals Group     BP 03/07/21 1025 102/72     Pulse Rate 03/07/21 1025 91     Resp 03/07/21 1025 16     Temp 03/07/21 1025 98.5 F (36.9 C)     Temp Source 03/07/21 1025 Oral     SpO2 03/07/21 1025 98 %     Weight 03/07/21 1026 172 lb (78 kg)  Height 03/07/21 1026 5\' 8"  (1.727 m)     Head Circumference --      Peak Flow --      Pain Score 03/07/21 1026 10     Pain Loc --      Pain Edu? --      Excl. in GC? --     Constitutional: Alert and oriented. Well appearing and in no acute distress. Eyes: Conjunctivae are normal. PERRL. EOMI. Head: Atraumatic. Nose: No congestion/rhinnorhea. EARS: Edematous left ear canal with signs of excoriation.  No visible lesion. Mouth/Throat: Mucous membranes are moist.  Oropharynx non-erythematous. Hematological/Lymphatic/Immunilogical: No cervical lymphadenopathy. Cardiovascular: Normal rate, regular rhythm.  Grossly normal heart sounds.  Good peripheral circulation. Respiratory: Normal respiratory effort.  No retractions. Lungs CTAB. Skin:  Skin is warm, dry and intact. No rash noted. Psychiatric: Mood and affect are normal. Speech and behavior are normal.  ____________________________________________   LABS (all labs ordered are listed, but only abnormal results are displayed)  Labs Reviewed - No data to display ____________________________________________  EKG   ____________________________________________  RADIOLOGY I, 05/07/21, personally viewed and evaluated these images (plain radiographs) as part of my medical decision making, as well as reviewing the written report by the radiologist.  ED MD interpretation:    Official radiology report(s): No results found.  ____________________________________________   PROCEDURES  Procedure(s) performed (including Critical Care):  Procedures   ____________________________________________   INITIAL IMPRESSION / ASSESSMENT AND PLAN / ED COURSE  As part of my medical decision making, I reviewed the following data within the electronic MEDICAL RECORD NUMBER         Patient presents with left ear pain secondary to a cleaning incident.  Physical exam revealed tenderness left canal with visible TM.  Patient complaint physical exam consistent otitis externa.  Patient given discharge care instruction advised use medication as directed.  Patient advised follow-up with ENT clinic if no improvement or worsening of complaint.      ____________________________________________   FINAL CLINICAL IMPRESSION(S) / ED DIAGNOSES  Final diagnoses:  Acute diffuse otitis externa of left ear     ED Discharge Orders         Ordered    neomycin-colistin-hydrocortisone-thonzonium (COLY-MYCIN S) 3.03-02-09-0.5 MG/ML OTIC suspension  4 times daily        03/07/21 1104    traMADol (ULTRAM) 50 MG tablet  Every 6 hours PRN        03/07/21 1104           *Please note:  Kristen Haney was evaluated in Emergency Department on 03/07/2021 for the symptoms described in the history of present illness. She was evaluated in the context of the global COVID-19 pandemic, which necessitated consideration that the patient might be at risk for infection with the SARS-CoV-2 virus that causes COVID-19. Institutional protocols and algorithms that pertain to the evaluation of patients at risk for COVID-19 are in a state of rapid change based on information released by regulatory bodies including the CDC and federal and state organizations. These policies and algorithms were followed during the patient's care in the ED.  Some ED evaluations and interventions may be delayed as a result of limited staffing during and the pandemic.*   Note:  This document was prepared using Dragon voice recognition software and may include unintentional dictation errors.    05/07/2021, PA-C 03/07/21 1111    05/07/21, MD 03/07/21 1340

## 2021-03-07 NOTE — ED Notes (Signed)
See triage note  Presents with pain to left ear  States possible "bump" with some swelling to top of left ear

## 2021-03-07 NOTE — ED Triage Notes (Signed)
Patient presents to the ED with tenderness and pain to the top of her left ear.  Patient states, "I don't know if it's a boil or what up there, but it really hurts."  Patient is in no obvious distress at this time.  Patient states, "it just feels really angry."  Patient denies attempting to lance area.

## 2021-04-06 ENCOUNTER — Encounter (HOSPITAL_COMMUNITY): Payer: Self-pay

## 2021-04-06 ENCOUNTER — Emergency Department (HOSPITAL_COMMUNITY): Payer: Medicaid Other

## 2021-04-06 ENCOUNTER — Emergency Department (HOSPITAL_COMMUNITY)
Admission: EM | Admit: 2021-04-06 | Discharge: 2021-04-06 | Disposition: A | Discharge status: Home / Self Care | Payer: Medicaid Other | Source: Home / Self Care | Attending: Emergency Medicine | Admitting: Emergency Medicine

## 2021-04-06 ENCOUNTER — Other Ambulatory Visit: Payer: Self-pay

## 2021-04-06 ENCOUNTER — Emergency Department (HOSPITAL_COMMUNITY)
Admission: EM | Admit: 2021-04-06 | Discharge: 2021-04-06 | Discharge status: Against Medical Advice | Payer: Medicaid Other | Attending: Emergency Medicine | Admitting: Emergency Medicine

## 2021-04-06 DIAGNOSIS — R109 Unspecified abdominal pain: Secondary | ICD-10-CM

## 2021-04-06 DIAGNOSIS — R1031 Right lower quadrant pain: Secondary | ICD-10-CM | POA: Diagnosis not present

## 2021-04-06 DIAGNOSIS — R110 Nausea: Secondary | ICD-10-CM | POA: Insufficient documentation

## 2021-04-06 DIAGNOSIS — F17210 Nicotine dependence, cigarettes, uncomplicated: Secondary | ICD-10-CM | POA: Insufficient documentation

## 2021-04-06 LAB — URINALYSIS, ROUTINE W REFLEX MICROSCOPIC
Bilirubin Urine: NEGATIVE
Glucose, UA: NEGATIVE mg/dL
Hgb urine dipstick: NEGATIVE
Ketones, ur: NEGATIVE mg/dL
Leukocytes,Ua: NEGATIVE
Nitrite: NEGATIVE
Protein, ur: NEGATIVE mg/dL
Specific Gravity, Urine: 1.009 (ref 1.005–1.030)
pH: 6 (ref 5.0–8.0)

## 2021-04-06 LAB — CBC
HCT: 36 % (ref 36.0–46.0)
Hemoglobin: 11.7 g/dL — ABNORMAL LOW (ref 12.0–15.0)
MCH: 28.8 pg (ref 26.0–34.0)
MCHC: 32.5 g/dL (ref 30.0–36.0)
MCV: 88.7 fL (ref 80.0–100.0)
Platelets: 244 10*3/uL (ref 150–400)
RBC: 4.06 MIL/uL (ref 3.87–5.11)
RDW: 14.1 % (ref 11.5–15.5)
WBC: 7 10*3/uL (ref 4.0–10.5)
nRBC: 0 % (ref 0.0–0.2)

## 2021-04-06 LAB — COMPREHENSIVE METABOLIC PANEL
ALT: 14 U/L (ref 0–44)
AST: 19 U/L (ref 15–41)
Albumin: 3.2 g/dL — ABNORMAL LOW (ref 3.5–5.0)
Alkaline Phosphatase: 35 U/L — ABNORMAL LOW (ref 38–126)
Anion gap: 5 (ref 5–15)
BUN: 5 mg/dL — ABNORMAL LOW (ref 6–20)
CO2: 26 mmol/L (ref 22–32)
Calcium: 8.3 mg/dL — ABNORMAL LOW (ref 8.9–10.3)
Chloride: 107 mmol/L (ref 98–111)
Creatinine, Ser: 0.68 mg/dL (ref 0.44–1.00)
GFR, Estimated: >60 mL/min (ref >60)
Glucose, Bld: 79 mg/dL (ref 70–99)
Potassium: 3.9 mmol/L (ref 3.5–5.1)
Sodium: 138 mmol/L (ref 135–145)
Total Bilirubin: 0.4 mg/dL (ref 0.3–1.2)
Total Protein: 5.7 g/dL — ABNORMAL LOW (ref 6.5–8.1)

## 2021-04-06 LAB — LIPASE, BLOOD: Lipase: 25 U/L (ref 11–51)

## 2021-04-06 LAB — PREGNANCY, URINE: Preg Test, Ur: NEGATIVE

## 2021-04-06 MED ORDER — HYDROMORPHONE HCL 1 MG/ML IJ SOLN
0.5000 mg | Freq: Once | INTRAMUSCULAR | Status: AC
  Administered 2021-04-06: 0.5 mg via INTRAVENOUS
  Filled 2021-04-06: qty 1

## 2021-04-06 MED ORDER — ONDANSETRON HCL 4 MG/2ML IJ SOLN
4.0000 mg | Freq: Once | INTRAMUSCULAR | Status: AC
  Administered 2021-04-06: 4 mg via INTRAVENOUS
  Filled 2021-04-06: qty 2

## 2021-04-06 MED ORDER — NAPROXEN 500 MG PO TABS: 500.0000 mg | ORAL_TABLET | Freq: Two times a day (BID) | ORAL | 0 refills | Status: DC

## 2021-04-06 MED ORDER — SODIUM CHLORIDE 0.9 % IV SOLN: INTRAVENOUS | Status: DC

## 2021-04-06 NOTE — ED Notes (Signed)
Patient is back from CT at this time.  

## 2021-04-06 NOTE — ED Notes (Signed)
Awaiting discharge paperwork, patient is getting dressed at this time. Vitals stable.

## 2021-04-06 NOTE — Discharge Instructions (Addendum)
Work-up for the right-sided flank pain without any acute findings.  Take the Naprosyn as directed.  Return for any new or worse symptoms.  Follow-up with wellness clinic if symptoms do not improve.

## 2021-04-06 NOTE — ED Provider Notes (Signed)
[facility] EMERGENCY DEPARTMENT Provider Note   CSN: [id number] Arrival date & time: 04/06/21  0746     History Chief Complaint  Patient presents with  . Abdominal Pain    Kristen Haney is a 30 y.o. female.  Patient with a complaint of right flank pain onset Saturday.  Patient states she has had a history of kidney stones think that may be it.  States pain is 10 out of 10.  Patient appears comfortable.  Patient actually was at [facility] just 30 minutes ago.  Was seen had labs and CT ordered but then was put back out in the waiting room and she left from there and came here.  Associated with some nausea no vomiting no hematuria.  Patient also states she has had a history of ovarian cyst and that could be that as well.        Past Medical History:  Diagnosis Date  . Abscesses of both axillae   . Depression   . Hx of migraines   . Hx: UTI (urinary tract infection)   . Medical history non-contributory     Patient Active Problem List   Diagnosis Date Noted  . PTSD (post-traumatic stress disorder) 06/14/2019  . 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 Intentional drug overdose (HCC)   . Sickle cell trait [facility])     Past Surgical History:  Procedure Laterality Date  . NONE    . TUBAL LIGATION       OB History    Gravida  3   Para  1   Term  1   Preterm      AB  1   Living  1     SAB      IAB  1   Ectopic      Multiple      Live Births  1           Family History  Problem Relation Age of Onset  . Hypertension Mother   . Cancer Maternal Aunt        PANCREATIC  . Stroke Paternal Grandmother   . Asthma Son     Social History   Tobacco Use  . Smoking status: Current Every Day Smoker    Packs/day: 0.50    Types: Cigarettes  . Smokeless tobacco: Never Used  Vaping Use  . Vaping Use: Never used  Substance Use Topics  . Alcohol use: No  . Drug use: Not Currently     Types: Marijuana    Home Medications Prior to Admission medications   Medication Sig Start Date End Date Taking? Authorizing Provider  naproxen (NAPROSYN) 500 MG tablet Take 1 tablet (500 mg total) by mouth 2 (two) times daily. 04/06/21  Yes Vanetta Mulders, MD  escitalopram (LEXAPRO) 5 MG tablet Take 5 mg by mouth daily.    [provider]    Allergies    Patient has no known allergies.  Review of Systems   Review of Systems  Constitutional: Negative for chills and fever.  HENT: Negative for rhinorrhea and sore throat.   Eyes: Negative for visual disturbance.  Respiratory: Negative for cough and shortness of breath.   Cardiovascular: Negative for chest pain and leg swelling.  Gastrointestinal: Positive for nausea. Negative for abdominal pain, diarrhea and vomiting.  Genitourinary: Positive for flank pain. Negative for dysuria.  Musculoskeletal: Negative for back pain and neck pain.  Skin: Negative for rash.  Neurological: Negative for  dizziness, light-headedness and headaches.  Hematological: Does not bruise/bleed easily.  Psychiatric/Behavioral: Negative for confusion.    Physical Exam Updated Vital Signs BP 112/76 (BP Location: Right Arm)   Pulse 79   Temp 98.3 F (36.8 C) (Oral)   Resp 16   LMP 12/24/2020   SpO2 97%   Physical Exam Vitals and nursing note reviewed.  Constitutional:      General: She is not in acute distress.    Appearance: Normal appearance. She is well-developed.  HENT:     Head: Normocephalic and atraumatic.  Eyes:     Extraocular Movements: Extraocular movements intact.     Conjunctiva/sclera: Conjunctivae normal.     Pupils: Pupils are equal, round, and reactive to light.  Cardiovascular:     Rate and Rhythm: Normal rate and regular rhythm.     Heart sounds: No murmur heard.   Pulmonary:     Effort: Pulmonary effort is normal. No respiratory distress.     Breath sounds: Normal breath sounds.  Abdominal:     Palpations:  Abdomen is soft.     Tenderness: There is no abdominal tenderness.  Musculoskeletal:        General: No swelling. Normal range of motion.     Cervical back: Neck supple.  Skin:    General: Skin is warm and dry.     Capillary Refill: Capillary refill takes less than 2 seconds.  Neurological:     General: No focal deficit present.     Mental Status: She is alert and oriented to person, place, and time.     Cranial Nerves: No cranial nerve deficit.     Sensory: No sensory deficit.     Motor: No weakness.     ED Results / Procedures / Treatments   Labs (all labs ordered are listed, but only abnormal results are displayed) Labs Reviewed  CBC - Abnormal; Notable for the following components:      Result Value   Hemoglobin 11.7 (*)    All other components within normal limits  COMPREHENSIVE METABOLIC PANEL - Abnormal; Notable for the following components:   BUN 5 (*)    Calcium 8.3 (*)    Total Protein 5.7 (*)    Albumin 3.2 (*)    Alkaline Phosphatase 35 (*)    All other components within normal limits  URINALYSIS, ROUTINE W REFLEX MICROSCOPIC  PREGNANCY, URINE  LIPASE, BLOOD    EKG None  Radiology CT Renal Stone Study  Result Date: 04/06/2021 CLINICAL DATA:  Right lower quadrant/flank pain EXAM: CT ABDOMEN AND PELVIS WITHOUT CONTRAST TECHNIQUE: Multidetector CT imaging of the abdomen and pelvis was performed following the standard protocol without IV contrast. COMPARISON:  None. FINDINGS: Lower chest: No acute abnormality. Hepatobiliary: No focal liver abnormality is seen. No gallstones, gallbladder wall thickening, or biliary dilatation. Pancreas: Unremarkable. No pancreatic ductal dilatation or surrounding inflammatory changes. Spleen: Normal in size without focal abnormality. Adrenals/Urinary Tract: Adrenal glands are unremarkable. Kidneys are normal, without renal calculi, focal lesion, or hydronephrosis. Bladder is unremarkable. Stomach/Bowel: Stomach is within normal  limits. Appendix not visualized. No evidence of bowel wall thickening, distention, or inflammatory changes. Vascular/Lymphatic: Limited evaluation in the absence of intravenous contrast. No significant vascular findings are present. No enlarged abdominal or pelvic lymph nodes. Reproductive: Uterus and bilateral adnexa are unremarkable. Other: No abdominal wall hernia or abnormality. No abdominopelvic ascites. Musculoskeletal: No acute or significant osseous findings. IMPRESSION: 1. No evidence of hydronephrosis, nephrolithiasis or ureterolithiasis. 2. The appendix is not  well visualized in the right lower quadrant in the absence of intravenous contrast. However, no definitive inflammatory changes or free fluid are evident to suggest acute appendicitis. Electronically Signed   By: Malachy Moan M.D.   On: 04/06/2021 10:07    Procedures Procedures   Medications Ordered in ED Medications  0.9 %  sodium chloride infusion ( Intravenous New Bag/Given 04/06/21 0842)  ondansetron (ZOFRAN) injection 4 mg (4 mg Intravenous Given 04/06/21 0842)  HYDROmorphone (DILAUDID) injection 0.5 mg (0.5 mg Intravenous Given 04/06/21 8413)    ED Course  I have reviewed the triage vital signs and the nursing notes.  Pertinent labs & imaging results that were available during my care of the patient were reviewed by me and considered in my medical decision making (see chart for details).    MDM Rules/Calculators/A&P                          Work-up for the right-sided flank pain without any acute findings.  CT scan negative for any ovarian cyst or kidney stone.  Urinalysis negative.  Pregnancy test negative.  Labs without any significant abnormalities.  May be musculoskeletal will treat with a 7-day course of Naprosyn gave her referral to follow-up with wellness clinic.  Patient will return for any new or worse symptoms     Final Clinical Impression(s) / ED Diagnoses Final diagnoses:  Flank pain, acute    Rx / DC  Orders ED Discharge Orders         Ordered    naproxen (NAPROSYN) 500 MG tablet  2 times daily        04/06/21 1106           Vanetta Mulders, MD 04/06/21 1111

## 2021-04-06 NOTE — ED Provider Notes (Signed)
[facility]-EMERGENCY DEPT Provider Note   CSN: [id number] Arrival date & time: 04/06/21  6578     History Chief Complaint  Patient presents with  . Flank Pain    Kristen Haney is a 30 y.o. female.  Patient c/o right flank pain in past few days. Symptoms gradual onset, moderate, constant, persistent, dull, non radiating. Remote hx kidney stones, unsure if same. No hx gallstones. No dysuria or hematuria. Had normal period 1 week ago, no abnormal vaginal bleeding or discharge. Is having normal bms. No nvd. Recent heavy lifting but denies specific injury or strain to area.   The history is provided by the patient.  Flank Pain Pertinent negatives include no chest pain, no headaches and no shortness of breath.       Past Medical History:  Diagnosis Date  . Abscesses of both axillae   . Depression   . Hx of migraines   . Hx: UTI (urinary tract infection)   . Medical history non-contributory     Patient Active Problem List   Diagnosis Date Noted  . PTSD (post-traumatic stress disorder) 06/14/2019  . 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 Intentional drug overdose (HCC)   . Sickle cell trait [facility])     Past Surgical History:  Procedure Laterality Date  . NONE    . TUBAL LIGATION       OB History    Gravida  3   Para  1   Term  1   Preterm      AB  1   Living  1     SAB      IAB  1   Ectopic      Multiple      Live Births  1           Family History  Problem Relation Age of Onset  . Hypertension Mother   . Cancer Maternal Aunt        PANCREATIC  . Stroke Paternal Grandmother   . Asthma Son     Social History   Tobacco Use  . Smoking status: Current Every Day Smoker    Packs/day: 0.50    Types: Cigarettes  . Smokeless tobacco: Never Used  Vaping Use  . Vaping Use: Never used  Substance Use Topics  . Alcohol use: No  . Drug use: Not Currently    Types: Marijuana     Home Medications Prior to Admission medications   Medication Sig Start Date End Date Taking? Authorizing Provider  escitalopram (LEXAPRO) 5 MG tablet Take 5 mg by mouth daily.    [provider]    Allergies    Patient has no known allergies.  Review of Systems   Review of Systems  Constitutional: Negative for chills and fever.  HENT: Negative for sore throat.   Eyes: Negative for redness.  Respiratory: Negative for cough and shortness of breath.   Cardiovascular: Negative for chest pain.  Gastrointestinal: Negative for constipation, diarrhea and vomiting.  Genitourinary: Positive for flank pain. Negative for dysuria.  Musculoskeletal: Negative for neck pain.  Skin: Negative for rash.  Neurological: Negative for headaches.  Hematological: Does not bruise/bleed easily.  Psychiatric/Behavioral: Negative for confusion.    Physical Exam Updated Vital Signs BP 129/84 (BP Location: Right Arm)   Pulse 92   Temp 98.2 F (36.8 C) (Oral)   Resp 17   Ht 1.727 m (5\' 8" )   Wt 79.8 kg  LMP 12/24/2020   SpO2 100%   BMI 26.76 kg/m   Physical Exam Vitals and nursing note reviewed.  Constitutional:      Appearance: Normal appearance. She is well-developed.  HENT:     Head: Atraumatic.     Nose: Nose normal.     Mouth/Throat:     Mouth: Mucous membranes are moist.  Eyes:     General: No scleral icterus.    Conjunctiva/sclera: Conjunctivae normal.  Neck:     Trachea: No tracheal deviation.  Cardiovascular:     Rate and Rhythm: Normal rate and regular rhythm.     Pulses: Normal pulses.     Heart sounds: Normal heart sounds. No murmur heard. No friction rub. No gallop.   Pulmonary:     Effort: Pulmonary effort is normal. No respiratory distress.     Breath sounds: Normal breath sounds.  Abdominal:     General: Bowel sounds are normal. There is no distension.     Palpations: Abdomen is soft.     Tenderness: There is no abdominal tenderness. There is no  guarding.  Genitourinary:    Comments: No cva tenderness.  Musculoskeletal:        General: No swelling.     Cervical back: Normal range of motion and neck supple. No rigidity. No muscular tenderness.  Skin:    General: Skin is warm and dry.     Findings: No rash.  Neurological:     Mental Status: She is alert.     Comments: Alert, speech normal. Steady gait.   Psychiatric:        Mood and Affect: Mood normal.     ED Results / Procedures / Treatments   Labs (all labs ordered are listed, but only abnormal results are displayed)   EKG None  Radiology No results found.  Procedures Procedures   Medications Ordered in ED Medications - No data to display  ED Course  I have reviewed the triage vital signs and the nursing notes.  Pertinent labs & imaging results that were available during my care of the patient were reviewed by me and considered in my medical decision making (see chart for details).    MDM Rules/Calculators/A&P                         Labs ordered.   Reviewed nursing notes and prior charts for additional history.   Labs reviewed/interpreted by me - preg neg.   Hx kidney stones, right flank pain - will get ct.   Went to recheck pt, had left ED prior to completion of labs and imaging - pt left AMA without notifying EDP prior to leaving.    Final Clinical Impression(s) / ED Diagnoses Final diagnoses:  None    Rx / DC Orders ED Discharge Orders    None       Cathren Laine, MD 04/08/21 2238

## 2021-04-06 NOTE — ED Notes (Signed)
Placed back on the room monitor, denies needs at this time.

## 2021-04-06 NOTE — ED Triage Notes (Signed)
Patient arrived with complaints of right sided flank pain. Reports no urinary symptoms and felt relief after a muscle relaxer but pain has returned. Also reports her abdomen feeling full.

## 2021-04-06 NOTE — ED Triage Notes (Signed)
Pt reports two weeks of generalized abdominal pain, bloating, and nausea. Sts she's had ongoing stomach problems since she had her tubes tied three years ago, but on Saturday she moved a certain way and thinks she pulled a muscle on her R side. Normal bowel movements.

## 2021-04-06 NOTE — ED Notes (Signed)
Vital signs stable. 

## 2021-04-09 ENCOUNTER — Other Ambulatory Visit: Payer: Self-pay

## 2021-04-09 ENCOUNTER — Emergency Department (HOSPITAL_COMMUNITY): Payer: Medicaid Other

## 2021-04-09 ENCOUNTER — Emergency Department (HOSPITAL_COMMUNITY)
Admission: EM | Admit: 2021-04-09 | Discharge: 2021-04-09 | Disposition: A | Discharge status: Home / Self Care | Payer: Medicaid Other | Attending: Emergency Medicine | Admitting: Emergency Medicine

## 2021-04-09 ENCOUNTER — Encounter (HOSPITAL_COMMUNITY): Payer: Self-pay | Admitting: Emergency Medicine

## 2021-04-09 DIAGNOSIS — N200 Calculus of kidney: Secondary | ICD-10-CM | POA: Diagnosis not present

## 2021-04-09 DIAGNOSIS — R109 Unspecified abdominal pain: Secondary | ICD-10-CM

## 2021-04-09 DIAGNOSIS — F17210 Nicotine dependence, cigarettes, uncomplicated: Secondary | ICD-10-CM | POA: Diagnosis not present

## 2021-04-09 DIAGNOSIS — R0602 Shortness of breath: Secondary | ICD-10-CM | POA: Insufficient documentation

## 2021-04-09 DIAGNOSIS — N289 Disorder of kidney and ureter, unspecified: Secondary | ICD-10-CM

## 2021-04-09 DIAGNOSIS — Z20822 Contact with and (suspected) exposure to covid-19: Secondary | ICD-10-CM | POA: Insufficient documentation

## 2021-04-09 LAB — I-STAT BETA HCG BLOOD, ED (MC, WL, AP ONLY): I-stat hCG, quantitative: <5 m[IU]/mL (ref <5)

## 2021-04-09 LAB — COMPREHENSIVE METABOLIC PANEL
ALT: 16 U/L (ref 0–44)
AST: 16 U/L (ref 15–41)
Albumin: 3.1 g/dL — ABNORMAL LOW (ref 3.5–5.0)
Alkaline Phosphatase: 44 U/L (ref 38–126)
Anion gap: 6 (ref 5–15)
BUN: <5 mg/dL — ABNORMAL LOW (ref 6–20)
CO2: 25 mmol/L (ref 22–32)
Calcium: 8.5 mg/dL — ABNORMAL LOW (ref 8.9–10.3)
Chloride: 105 mmol/L (ref 98–111)
Creatinine, Ser: 0.7 mg/dL (ref 0.44–1.00)
GFR, Estimated: >60 mL/min (ref >60)
Glucose, Bld: 102 mg/dL — ABNORMAL HIGH (ref 70–99)
Potassium: 3.8 mmol/L (ref 3.5–5.1)
Sodium: 136 mmol/L (ref 135–145)
Total Bilirubin: 0.9 mg/dL (ref 0.3–1.2)
Total Protein: 6 g/dL — ABNORMAL LOW (ref 6.5–8.1)

## 2021-04-09 LAB — CBC WITH DIFFERENTIAL/PLATELET
Abs Immature Granulocytes: 0.02 10*3/uL (ref 0.00–0.07)
Basophils Absolute: 0 10*3/uL (ref 0.0–0.1)
Basophils Relative: 0 %
Eosinophils Absolute: 0 10*3/uL (ref 0.0–0.5)
Eosinophils Relative: 0 %
HCT: 36 % (ref 36.0–46.0)
Hemoglobin: 12 g/dL (ref 12.0–15.0)
Immature Granulocytes: 0 %
Lymphocytes Relative: 15 %
Lymphs Abs: 1 10*3/uL (ref 0.7–4.0)
MCH: 28.7 pg (ref 26.0–34.0)
MCHC: 33.3 g/dL (ref 30.0–36.0)
MCV: 86.1 fL (ref 80.0–100.0)
Monocytes Absolute: 0.9 10*3/uL (ref 0.1–1.0)
Monocytes Relative: 13 %
Neutro Abs: 4.8 10*3/uL (ref 1.7–7.7)
Neutrophils Relative %: 72 %
Platelets: 219 10*3/uL (ref 150–400)
RBC: 4.18 MIL/uL (ref 3.87–5.11)
RDW: 14 % (ref 11.5–15.5)
WBC: 6.7 10*3/uL (ref 4.0–10.5)
nRBC: 0 % (ref 0.0–0.2)

## 2021-04-09 LAB — URINALYSIS, ROUTINE W REFLEX MICROSCOPIC
Bilirubin Urine: NEGATIVE
Glucose, UA: NEGATIVE mg/dL
Hgb urine dipstick: NEGATIVE
Ketones, ur: NEGATIVE mg/dL
Nitrite: NEGATIVE
Protein, ur: NEGATIVE mg/dL
Specific Gravity, Urine: 1.009 (ref 1.005–1.030)
pH: 7 (ref 5.0–8.0)

## 2021-04-09 LAB — TROPONIN I (HIGH SENSITIVITY): Troponin I (High Sensitivity): <2 ng/L (ref <18)

## 2021-04-09 LAB — LIPASE, BLOOD: Lipase: 27 U/L (ref 11–51)

## 2021-04-09 LAB — RESP PANEL BY RT-PCR (FLU A&B, COVID) ARPGX2
Influenza A by PCR: NEGATIVE
Influenza B by PCR: NEGATIVE
SARS Coronavirus 2 by RT PCR: NEGATIVE

## 2021-04-09 MED ORDER — SODIUM CHLORIDE 0.9 % IV BOLUS
1000.0000 mL | Freq: Once | INTRAVENOUS | Status: AC
  Administered 2021-04-09: 1000 mL via INTRAVENOUS

## 2021-04-09 MED ORDER — LIDOCAINE 5 % EX PTCH: 1.0000 | MEDICATED_PATCH | CUTANEOUS | 0 refills | Status: DC

## 2021-04-09 MED ORDER — SODIUM CHLORIDE 0.9 % IV BOLUS
500.0000 mL | Freq: Once | INTRAVENOUS | Status: AC
  Administered 2021-04-09: 500 mL via INTRAVENOUS

## 2021-04-09 MED ORDER — METHOCARBAMOL 500 MG PO TABS: 500.0000 mg | ORAL_TABLET | Freq: Two times a day (BID) | ORAL | 0 refills | Status: AC

## 2021-04-09 MED ORDER — IOHEXOL 350 MG/ML SOLN
100.0000 mL | Freq: Once | INTRAVENOUS | Status: AC | PRN
  Administered 2021-04-09: 100 mL via INTRAVENOUS

## 2021-04-09 MED ORDER — MORPHINE SULFATE (PF) 4 MG/ML IV SOLN
4.0000 mg | Freq: Once | INTRAVENOUS | Status: AC
  Administered 2021-04-09: 4 mg via INTRAVENOUS
  Filled 2021-04-09: qty 1

## 2021-04-09 NOTE — Discharge Instructions (Addendum)
At this time there does not appear to be the presence of an emergent medical condition, however there is always the potential for conditions to change. Please read and follow the below instructions.  Please return to the Emergency Department immediately for any new or worsening symptoms. Please be sure to follow up with your Primary Care Provider within one week regarding your visit today; please call their office to schedule an appointment even if you are feeling better for a follow-up visit.  If you do not have a primary care provider you may call [facility] to establish 1. Your CT scan today showed mild postinfectious or inflammatory scarring of your right lung, 2 right kidney lesions, a right adnexal cyst, trace free fluid in your pelvis. You need to get an MRI of your abdomen to look at the 2 lesions on your right kidney to ensure that these findings are not cancerous.  Please call your primary care provider's office today to inform them of this finding and to schedule your MRI. You may use the Lidoderm patch as prescribed to help with your symptoms.  Lidoderm may be expensive so you may speak with your pharmacist about finding over-the-counter medications that work similarly. You may use the muscle relaxer Robaxin as prescribed to help with your symptoms.  Do not drive or operate heavy machinery while taking Robaxin as it will make you drowsy.  Do not drink alcohol or take other sedating medications while taking Robaxin as this will worsen side effects. Your urine sample today was sent for a culture, if it grows out bacteria that require treatment with an antibiotic you will be called by [facility] with a prescription.  Go to the nearest Emergency Department immediately if: You have fever or chills You cannot stop vomiting. Your pain is only in areas of your belly, such as the right side or the left lower part of the belly. You have bloody or black poop, or poop  that looks like tar. You have very bad pain, cramping, or bloating in your belly. You have signs of not having enough fluid or water in your body (dehydration), such as: Dark pee, very little pee, or no pee. Cracked lips. Dry mouth. Sunken eyes. Sleepiness. Weakness. You have trouble breathing or chest pain. You have any new/concerning or worsening of symptoms  Please read the additional information packets attached to your discharge summary.  Do not take your medicine if  develop an itchy rash, swelling in your mouth or lips, or difficulty breathing; call 911 and seek immediate emergency medical attention if this occurs.  You may review your lab tests and imaging results in their entirety on your MyChart account.  Please discuss all results of fully with your primary care provider and other specialist at your follow-up visit.  Note: Portions of this text may have been transcribed using voice recognition software. Every effort was made to ensure accuracy; however, inadvertent computerized transcription errors may still be present.

## 2021-04-09 NOTE — ED Provider Notes (Signed)
[name] EMERGENCY DEPARTMENT Provider Note   CSN: [id number] Arrival date & time: 04/09/21  1478     History Chief Complaint  Patient presents with  . Abdominal Pain  . Shortness of Breath    Kristen Haney is a 30 y.o. female history of migraines, depression  Patient presents today for right-sided abdominal pain onset around a week ago, she reports pain began shortly after moving some furniture.  She describes sharp right-sided abdominal pain constant worsened with movement and deep breathing no alleviating factors no radiation of pain.  Patient reports she was seen in the ER a few days ago and had a CT scan which did not show any kidney stones, she reports that since discharge her pain has worsened despite taking NSAIDs.  Patient denies fever/chills, chest pain, cough/mopped nausea/vomiting,, dysuria/hematuria, vaginal bleeding/discharge, concern for STI or any additional concerns  HPI     Past Medical History:  Diagnosis Date  . Abscesses of both axillae   . Depression   . Hx of migraines   . Hx: UTI (urinary tract infection)   . Medical history non-contributory     Patient Active Problem List   Diagnosis Date Noted  . PTSD (post-traumatic stress disorder) 06/14/2019  . 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 Intentional drug overdose (HCC)   . Sickle cell trait [facility])     Past Surgical History:  Procedure Laterality Date  . NONE    . TUBAL LIGATION       OB History    Gravida  3   Para  1   Term  1   Preterm      AB  1   Living  1     SAB      IAB  1   Ectopic      Multiple      Live Births  1           Family History  Problem Relation Age of Onset  . Hypertension Mother   . Cancer Maternal Aunt        PANCREATIC  . Stroke Paternal Grandmother   . Asthma Son     Social History   Tobacco Use  . Smoking status: Current Every Day Smoker    Packs/day: 0.50     Types: Cigarettes  . Smokeless tobacco: Never Used  Vaping Use  . Vaping Use: Never used  Substance Use Topics  . Alcohol use: No  . Drug use: Not Currently    Types: Marijuana    Home Medications Prior to Admission medications   Medication Sig Start Date End Date Taking? Authorizing Provider  lidocaine (LIDODERM) 5 % Place 1 patch onto the skin daily. Remove & Discard patch within 12 hours or as directed by MD 04/09/21  Yes Harlene Salts A, PA-C  methocarbamol (ROBAXIN) 500 MG tablet Take 1 tablet (500 mg total) by mouth 2 (two) times daily for 7 days. 04/09/21 04/16/21 Yes Harlene Salts A, PA-C  escitalopram (LEXAPRO) 5 MG tablet Take 5 mg by mouth daily.    [provider]  naproxen (NAPROSYN) 500 MG tablet Take 1 tablet (500 mg total) by mouth 2 (two) times daily. 04/06/21   Vanetta Mulders, MD    Allergies    Patient has no known allergies.  Review of Systems   Review of Systems Ten systems are reviewed and are negative for acute change except as noted in the HPI  Physical Exam  Updated Vital Signs BP 118/74   Pulse 78   Temp 99.2 F (37.3 C) (Oral)   Resp (!) 26   LMP 03/17/2021   SpO2 98%   Physical Exam Constitutional:      General: She is not in acute distress.    Appearance: Normal appearance. She is well-developed. She is not ill-appearing or diaphoretic.  HENT:     Head: Normocephalic and atraumatic.  Eyes:     General: Vision grossly intact. Gaze aligned appropriately.     Pupils: Pupils are equal, round, and reactive to light.  Neck:     Trachea: Trachea and phonation normal.  Cardiovascular:     Rate and Rhythm: Normal rate and regular rhythm.  Pulmonary:     Effort: Pulmonary effort is normal. No respiratory distress.  Chest:     Chest wall: Tenderness present. No deformity.  Abdominal:     General: There is no distension.     Palpations: Abdomen is soft.     Tenderness: There is abdominal tenderness in the right lower quadrant.  There is no guarding or rebound.  Musculoskeletal:        General: Normal range of motion.     Cervical back: Normal range of motion.  Skin:    General: Skin is warm and dry.  Neurological:     Mental Status: She is alert.     GCS: GCS eye subscore is 4. GCS verbal subscore is 5. GCS motor subscore is 6.     Comments: Speech is clear and goal oriented, follows commands Major Cranial nerves without deficit, no facial droop Moves extremities without ataxia, coordination intact  Psychiatric:        Behavior: Behavior normal.     ED Results / Procedures / Treatments   Labs (all labs ordered are listed, but only abnormal results are displayed) Labs Reviewed  COMPREHENSIVE METABOLIC PANEL - Abnormal; Notable for the following components:      Result Value   Glucose, Bld 102 (*)    BUN <5 (*)    Calcium 8.5 (*)    Total Protein 6.0 (*)    Albumin 3.1 (*)    All other components within normal limits  URINALYSIS, ROUTINE W REFLEX MICROSCOPIC - Abnormal; Notable for the following components:   APPearance HAZY (*)    Leukocytes,Ua SMALL (*)    Bacteria, UA FEW (*)    All other components within normal limits  RESP PANEL BY RT-PCR (FLU A&B, COVID) ARPGX2  URINE CULTURE  CBC WITH DIFFERENTIAL/PLATELET  LIPASE, BLOOD  I-STAT BETA HCG BLOOD, ED ([facility], [facility], [facility] ONLY)  TROPONIN I (HIGH SENSITIVITY)    EKG EKG Interpretation  Date/Time:  Sunday April 09 2021 07:16:07 EDT Ventricular Rate:  113 PR Interval:  140 QRS Duration: 76 QT Interval:  308 QTC Calculation: 422 R Axis:   78 Text Interpretation: Sinus tachycardia Biatrial enlargement Nonspecific T wave abnormality Abnormal ECG Confirmed by Margarita Grizzleay, Danielle [phone]([id number]) on 04/09/2021 11:17:46 AM   Radiology CT Angio Chest PE W and/or Wo Contrast  Result Date: 04/09/2021 CLINICAL DATA:  30 year old female with history of right lower quadrant abdominal pain in addition to chest pain when breathing. Suspected acute appendicitis. EXAM: CT  ANGIOGRAPHY CHEST CT ABDOMEN AND PELVIS WITH CONTRAST TECHNIQUE: Multidetector CT imaging of the chest was performed using the standard protocol during bolus administration of intravenous contrast. Multiplanar CT image reconstructions and MIPs were obtained to evaluate the vascular anatomy. Multidetector CT imaging of the abdomen and pelvis  was performed using the standard protocol during bolus administration of intravenous contrast. CONTRAST:  OMNIPAQUE IOHEXOL 350 MG/ML SOLN COMPARISON:  CT the abdomen and pelvis 04/06/2021. FINDINGS: CTA CHEST FINDINGS Cardiovascular: No filling defects within the pulmonary arterial tree to suggest underlying pulmonary embolism. Heart size is normal. There is no significant pericardial fluid, thickening or pericardial calcification. No atherosclerotic calcifications in the thoracic aorta or the coronary arteries. Mediastinum/Nodes: No pathologically enlarged mediastinal or hilar lymph nodes. Please note that accurate exclusion of hilar adenopathy is limited on noncontrast CT scans. Esophagus is unremarkable in appearance. No axillary lymphadenopathy. Lungs/Pleura: No acute consolidative airspace disease. No pleural effusions. No suspicious appearing pulmonary nodules or masses are noted. Mild post infectious or inflammatory scarring in the periphery of the right upper lobe. No pneumothorax. Musculoskeletal: There are no aggressive appearing lytic or blastic lesions noted in the visualized portions of the skeleton. Review of the MIP images confirms the above findings. CT ABDOMEN and PELVIS FINDINGS Hepatobiliary: Benign perfusion anomaly in segment 4B of the liver adjacent to the falciform ligament. No suspicious cystic or solid hepatic lesions. No intra or extrahepatic biliary ductal dilatation. Gallbladder is normal in appearance. Pancreas: No pancreatic mass. No pancreatic ductal dilatation. No pancreatic or peripancreatic fluid collections or inflammatory changes.  Spleen: Unremarkable. Adrenals/Urinary Tract: There are 2 right renal lesions which are indeterminate on today's examination, largest of which is in the interpolar region (axial image 29 of series 6) measuring 2.2 x 1.9 cm, with a smaller lesion in the posterior aspect of the lower pole of the right kidney (axial image 40 of series 6) measuring 1.2 x 1.1 cm. Left kidney and bilateral adrenal glands are normal in appearance. No hydroureteronephrosis. Urinary bladder is normal in appearance. Stomach/Bowel: The appearance of the stomach is normal. No pathologic dilatation of small bowel or colon. Normal appendix. Vascular/Lymphatic: No significant atherosclerotic disease, aneurysm or dissection noted in the abdominal or pelvic vasculature. No lymphadenopathy noted in the abdomen or pelvis. Reproductive: Uterus and left ovary are unremarkable in appearance. Rim enhancing structure measuring 1.9 x 1.8 cm in the right adnexa, most compatible with a degenerating corpus luteum cyst. Other: Trace volume of free fluid in the cul-de-sac, likely physiologic. No larger volume of ascites. No pneumoperitoneum. Musculoskeletal: There are no aggressive appearing lytic or blastic lesions noted in the visualized portions of the skeleton. Review of the MIP images confirms the above findings. IMPRESSION: 1. No evidence of pulmonary embolism. 2. Normal appendix. 3. Degenerating corpus luteum cyst in the right ovary with small volume of physiologic free fluid in the low anatomic pelvis. 4. Two indeterminate right renal lesions, as detailed above. Further evaluation with nonemergent abdominal MRI with and without IV gadolinium is strongly recommended in the near future to better characterize these findings and exclude malignancy. Electronically Signed   By: Trudie Reed M.D.   On: 04/09/2021 10:48   CT ABDOMEN PELVIS W CONTRAST  Result Date: 04/09/2021 CLINICAL DATA:  30 year old female with history of right lower quadrant  abdominal pain in addition to chest pain when breathing. Suspected acute appendicitis. EXAM: CT ANGIOGRAPHY CHEST CT ABDOMEN AND PELVIS WITH CONTRAST TECHNIQUE: Multidetector CT imaging of the chest was performed using the standard protocol during bolus administration of intravenous contrast. Multiplanar CT image reconstructions and MIPs were obtained to evaluate the vascular anatomy. Multidetector CT imaging of the abdomen and pelvis was performed using the standard protocol during bolus administration of intravenous contrast. CONTRAST:  OMNIPAQUE IOHEXOL 350 MG/ML SOLN  COMPARISON:  CT the abdomen and pelvis 04/06/2021. FINDINGS: CTA CHEST FINDINGS Cardiovascular: No filling defects within the pulmonary arterial tree to suggest underlying pulmonary embolism. Heart size is normal. There is no significant pericardial fluid, thickening or pericardial calcification. No atherosclerotic calcifications in the thoracic aorta or the coronary arteries. Mediastinum/Nodes: No pathologically enlarged mediastinal or hilar lymph nodes. Please note that accurate exclusion of hilar adenopathy is limited on noncontrast CT scans. Esophagus is unremarkable in appearance. No axillary lymphadenopathy. Lungs/Pleura: No acute consolidative airspace disease. No pleural effusions. No suspicious appearing pulmonary nodules or masses are noted. Mild post infectious or inflammatory scarring in the periphery of the right upper lobe. No pneumothorax. Musculoskeletal: There are no aggressive appearing lytic or blastic lesions noted in the visualized portions of the skeleton. Review of the MIP images confirms the above findings. CT ABDOMEN and PELVIS FINDINGS Hepatobiliary: Benign perfusion anomaly in segment 4B of the liver adjacent to the falciform ligament. No suspicious cystic or solid hepatic lesions. No intra or extrahepatic biliary ductal dilatation. Gallbladder is normal in appearance. Pancreas: No pancreatic mass. No pancreatic  ductal dilatation. No pancreatic or peripancreatic fluid collections or inflammatory changes. Spleen: Unremarkable. Adrenals/Urinary Tract: There are 2 right renal lesions which are indeterminate on today's examination, largest of which is in the interpolar region (axial image 29 of series 6) measuring 2.2 x 1.9 cm, with a smaller lesion in the posterior aspect of the lower pole of the right kidney (axial image 40 of series 6) measuring 1.2 x 1.1 cm. Left kidney and bilateral adrenal glands are normal in appearance. No hydroureteronephrosis. Urinary bladder is normal in appearance. Stomach/Bowel: The appearance of the stomach is normal. No pathologic dilatation of small bowel or colon. Normal appendix. Vascular/Lymphatic: No significant atherosclerotic disease, aneurysm or dissection noted in the abdominal or pelvic vasculature. No lymphadenopathy noted in the abdomen or pelvis. Reproductive: Uterus and left ovary are unremarkable in appearance. Rim enhancing structure measuring 1.9 x 1.8 cm in the right adnexa, most compatible with a degenerating corpus luteum cyst. Other: Trace volume of free fluid in the cul-de-sac, likely physiologic. No larger volume of ascites. No pneumoperitoneum. Musculoskeletal: There are no aggressive appearing lytic or blastic lesions noted in the visualized portions of the skeleton. Review of the MIP images confirms the above findings. IMPRESSION: 1. No evidence of pulmonary embolism. 2. Normal appendix. 3. Degenerating corpus luteum cyst in the right ovary with small volume of physiologic free fluid in the low anatomic pelvis. 4. Two indeterminate right renal lesions, as detailed above. Further evaluation with nonemergent abdominal MRI with and without IV gadolinium is strongly recommended in the near future to better characterize these findings and exclude malignancy. Electronically Signed   By: Trudie Reed M.D.   On: 04/09/2021 10:48   DG Chest Portable 1 View  Result Date:  04/09/2021 CLINICAL DATA:  Shortness of breath EXAM: PORTABLE CHEST 1 VIEW COMPARISON:  January 07, 2020 FINDINGS: Lungs are clear. The heart size and pulmonary vascularity are normal. No adenopathy. No pneumothorax. No bone lesions. IMPRESSION: The lungs are clear.  Cardiac silhouette normal. Electronically Signed   By: Bretta Bang III M.D.   On: 04/09/2021 08:22    Procedures Procedures   Medications Ordered in ED Medications  sodium chloride 0.9 % bolus 1,000 mL (1,000 mLs Intravenous New Bag/Given 04/09/21 0800)  morphine 4 MG/ML injection 4 mg (4 mg Intravenous Given 04/09/21 0800)  sodium chloride 0.9 % bolus 500 mL (500 mLs Intravenous New Bag/Given 04/09/21  1107)  iohexol (OMNIPAQUE) 350 MG/ML injection 100 mL (100 mLs Intravenous Contrast Given 04/09/21 1018)    ED Course  I have reviewed the triage vital signs and the nursing notes.  Pertinent labs & imaging results that were available during my care of the patient were reviewed by me and considered in my medical decision making (see chart for details).    MDM Rules/Calculators/A&P                         Additional history obtained from: 1. Nursing notes from this visit. 2. Review of electronic medical records, patient had CT renal stone study performed 04/06/2021, appendix was not well-visualized at that time. --------- 30 year old female history as above presents with worsening right-sided abdominal pain over the past week.  She is tender in the right lower quadrant on exam which is concerning for appendicitis.  Will obtain CT abdomen pelvis with contrast to further evaluate.  Additionally she is reporting shortness of breath due to her pain, she is tachycardic on arrival with a rate of around 114 bpm.  Unable to apply [facility] criteria for this reason.  Consideration of pulmonary embolism given tachycardia and shortness of breath possibly referred pain to the right abdomen.  Will obtain CT angio chest to evaluate for pulmonary  embolism.  Discussed plan of care with patient she is agreeable to CT imaging today. Plan discussed with attending physician Dr. Rosalia Hammers who agrees with plan. ---------------- I ordered, reviewed and interpreted labs which include: High-sensitivity troponin negative, onset of symptoms several days ago no indication for delta troponin. Pregnancy test negative. Lipase normal limits, doubt pancreatitis. Urinalysis shows small leukocytes, 11-20 WBCs, rare bacteria, no nitrites.  Patient without urinary symptoms doubt UTI will send for culture. CMP shows no emergent electrolyte derangement, AKI, LFT elevations or gap. CBC within normal limits, no leukocytosis, anemia or thrombocytopenia. Covid/influenza panel negative.  CXR:  IMPRESSION:  The lungs are clear. Cardiac silhouette normal.   CT Angio Chest/ CT Abdomen Pelvis:  IMPRESSION:  1. No evidence of pulmonary embolism.  2. Normal appendix.  3. Degenerating corpus luteum cyst in the right ovary with small  volume of physiologic free fluid in the low anatomic pelvis.  4. Two indeterminate right renal lesions, as detailed above. Further  evaluation with nonemergent abdominal MRI with and without IV  gadolinium is strongly recommended in the near future to better  characterize these findings and exclude malignancy.   EKG: Sinus tachycardia Biatrial enlargement Nonspecific T wave abnormality Abnormal ECG Confirmed by Margarita Grizzle [id number]) on 04/09/2021 11:17:46 AM - Patient reassessed sleeping comfortably no acute distress.  Vital signs are stable.  Patient updated on findings above she states understanding and plans to follow-up with PCP for MRI.  She also plans to follow-up with her OB/GYN.  Suspect patient symptoms today secondary to muscular strain will start patient on muscle relaxers in addition to the NSAID she is taking.  Will also start patient on Lidoderm patches.  Patient made aware of muscle relaxer precautions and she stated  understanding.  Doubt acute coronary syndrome, PE, dissection, pneumonia, asthma exacerbation, PTX or other emergent causes of patient's shortness of breath today.  Suspect that this is secondary to her muscular strain.  At this time there does not appear to be any evidence of an acute emergency medical condition and the patient appears stable for discharge with appropriate outpatient follow up. Diagnosis was discussed with patient who verbalizes understanding of care  plan and is agreeable to discharge. I have discussed return precautions with patient and family member who verbalizes understanding. Patient encouraged to follow-up with their PCP. All questions answered.  Patient's case discussed with Dr. Rosalia Hammers who agrees with plan to discharge with follow-up.   Note: Portions of this report may have been transcribed using voice recognition software. Every effort was made to ensure accuracy; however, inadvertent computerized transcription errors may still be present. Final Clinical Impression(s) / ED Diagnoses Final diagnoses:  Abdominal pain, unspecified abdominal location  Kidney lesion    Rx / DC Orders ED Discharge Orders         Ordered    methocarbamol (ROBAXIN) 500 MG tablet  2 times daily        04/09/21 1107    lidocaine (LIDODERM) 5 %  Every 24 hours        04/09/21 1107           Elizabeth Palau 04/09/21 1125    Margarita Grizzle, MD 04/09/21 1537

## 2021-04-09 NOTE — ED Notes (Signed)
Patient transported to CT 

## 2021-04-09 NOTE — ED Triage Notes (Signed)
Pt reports R lateral abd pain since last Saturday.  States she was seen in ED Thursday and diagnosed with a muscle strain.  Pain is worse.  Taking Naproxen.  Reports nausea and SOB.  Denies vomiting, diarrhea, and urinary complaints.

## 2021-04-11 LAB — URINE CULTURE: Culture: >=100000 — AB

## 2021-04-12 ENCOUNTER — Telehealth: Payer: Self-pay | Admitting: *Deleted

## 2021-04-12 NOTE — Telephone Encounter (Signed)
Post ED Visit - Positive Culture Follow-up  Culture report reviewed by antimicrobial stewardship pharmacist: [facility] []  , Pharm.D. []  Enzo Bi, Pharm.D., BCPS AQ-ID []  , Pharm.D., BCPS []  Celedonio Miyamoto, .D., BCPS []  [place], .D., BCPS, AAHIVP []  Georgina Pillion, Pharm.D., BCPS, AAHIVP []  [address], PharmD, BCPS []  , PharmD, BCPS []  [place], PharmD, BCPS []  [place], PharmD []  , PharmD, BCPS []  Estella Husk, PharmD  Pharmacy Team []  Lysle Pearl, PharmD []  , PharmD []  Phillips Climes, PharmD []  , Rph []  Agapito Games) , PharmD []  Verlan Friends, PharmD []  , PharmD []  Mervyn Gay, PharmD []  , PharmD []  Vinnie Level, PharmD []  [facility], PharmD []  , PharmD []  Len Childs, PharmD   Positive urine culture No UTI symptoms and no further patient follow-up is required at this time. , Pharm  Greer Pickerel Vivian 04/12/2021, 10:32 AM

## 2021-05-25 ENCOUNTER — Other Ambulatory Visit: Payer: Self-pay

## 2021-05-25 ENCOUNTER — Emergency Department (HOSPITAL_COMMUNITY)
Admission: EM | Admit: 2021-05-25 | Discharge: 2021-05-25 | Disposition: A | Discharge status: Home / Self Care | Payer: Medicaid Other | Attending: Emergency Medicine | Admitting: Emergency Medicine

## 2021-05-25 ENCOUNTER — Encounter (HOSPITAL_COMMUNITY): Payer: Self-pay | Admitting: Emergency Medicine

## 2021-05-25 DIAGNOSIS — L02413 Cutaneous abscess of right upper limb: Secondary | ICD-10-CM | POA: Insufficient documentation

## 2021-05-25 DIAGNOSIS — L0291 Cutaneous abscess, unspecified: Secondary | ICD-10-CM

## 2021-05-25 DIAGNOSIS — F17210 Nicotine dependence, cigarettes, uncomplicated: Secondary | ICD-10-CM | POA: Insufficient documentation

## 2021-05-25 MED ORDER — CEPHALEXIN 500 MG PO CAPS: 500.0000 mg | ORAL_CAPSULE | Freq: Four times a day (QID) | ORAL | 0 refills | Status: AC

## 2021-05-25 MED ORDER — CEPHALEXIN 500 MG PO CAPS: 500.0000 mg | ORAL_CAPSULE | Freq: Four times a day (QID) | ORAL | 0 refills | Status: DC

## 2021-05-25 NOTE — Discharge Instructions (Signed)
You came to the emerge department today to be evaluated for your armpit abscess.  We discussed incision and drainage however he did not want to undertake this procedure today.  Please continue to do warm compresses at least 3 times daily.  Please take antibiotic as prescribed.  Please follow-up with [facility] as needed if symptoms do not improve.  Please take Ibuprofen (Advil, motrin) and Tylenol (acetaminophen) to relieve your pain.    You may take up to 600 MG (3 pills) of normal strength ibuprofen every 8 hours as needed.   You make take tylenol, up to 1,000 mg (two extra strength pills) every 8 hours as needed.   It is safe to take ibuprofen and tylenol at the same time as they work differently.   Do not take more than 3,000 mg tylenol in a 24 hour period (not more than one dose every 8 hours.  Please check all medication labels as many medications such as pain and cold medications may contain tylenol.  Do not drink alcohol while taking these medications.  Do not take other NSAID'S while taking ibuprofen (such as aleve or naproxen).  Please take ibuprofen with food to decrease stomach upset.  You may have diarrhea from the antibiotics.  It is very important that you continue to take the antibiotics even if you get diarrhea unless a medical professional tells you that you may stop taking them.  If you stop too early the bacteria you are being treated for will become stronger and you may need different, more powerful antibiotics that have more side effects and worsening diarrhea.  Please stay well hydrated and consider probiotics as they may decrease the severity of your diarrhea.  Please be aware that if you take any hormonal contraception (birth control pills, nexplanon, the ring, etc) that your birth control will not work while you are taking antibiotics and you need to use back up protection as directed on the birth control medication information insert.   Get help  right away if you: Have severe pain. See red streaks on your skin spreading away from the abscess.

## 2021-05-25 NOTE — ED Notes (Signed)
An After Visit Summary was printed and given to the patient. Discharge instructions given and no further questions at this time.  

## 2021-05-25 NOTE — ED Provider Notes (Signed)
[name]-EMERGENCY DEPT Provider Note   CSN: [id number] Arrival date & time: 05/25/21  1836     History Chief Complaint  Patient presents with  . Recurrent Skin Infections    Kristen Haney is a 30 y.o. female presents with chief complaint of abscess to right armpit.  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  Patient reports that she normally has these abscesses drained.  Patient denies any diagnosis of hidradenitis suppurativa.  Patient reports that current abscess has been present since Sunday 5/22.  Patient reports that abscess has increased in size over this time.  Has associated pain with abscess.  Patient rates pain 10/10 on the pain scale.  Pain is worse with touch.  No relief with warm compress.  Patient denies any drainage from abscess.  Patient denies any fevers, chills, nausea, vomiting, or confusion.  HPI     Past Medical History:  Diagnosis Date  . Abscesses of both axillae   . Depression   . Hx of migraines   . Hx: UTI (urinary tract infection)   . Medical history non-contributory     Patient Active Problem List   Diagnosis Date Noted  . PTSD (post-traumatic stress disorder) 06/14/2019  . 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 Intentional drug overdose (HCC)   . Sickle cell trait [facility])     Past Surgical History:  Procedure Laterality Date  . NONE    . TUBAL LIGATION       OB History    Gravida  3   Para  1   Term  1   Preterm      AB  1   Living  1     SAB      IAB  1   Ectopic      Multiple      Live Births  1           Family History  Problem Relation Age of Onset  . Hypertension Mother   . Cancer Maternal Aunt        PANCREATIC  . Stroke Paternal Grandmother   . Asthma Son     Social History   Tobacco Use  . Smoking status: Current Every Day Smoker    Packs/day: 0.50    Types:  Cigarettes  . Smokeless tobacco: Never Used  Vaping Use  . Vaping Use: Never used  Substance Use Topics  . Alcohol use: No  . Drug use: Not Currently    Types: Marijuana    Home Medications Prior to Admission medications   Medication Sig Start Date End Date Taking? Authorizing Provider  escitalopram (LEXAPRO) 5 MG tablet Take 5 mg by mouth daily.    [provider]  lidocaine (LIDODERM) 5 % Place 1 patch onto the skin daily. Remove & Discard patch within 12 hours or as directed by MD 04/09/21   Harlene Salts A, PA-C  naproxen (NAPROSYN) 500 MG tablet Take 1 tablet (500 mg total) by mouth 2 (two) times daily. 04/06/21   Vanetta Mulders, MD    Allergies    Patient has no known allergies.  Review of Systems   Review of Systems  Constitutional: Positive for chills. Negative for fever.  Gastrointestinal: Positive for nausea. Negative for vomiting.  Skin: Positive for wound. Negative for color change, pallor and rash.  Psychiatric/Behavioral: Negative for confusion.    Physical Exam Updated Vital Signs BP Marland Kitchen)  118/93 (BP Location: Left Arm)   Pulse 86   Temp 99.6 F (37.6 C) (Oral)   Resp 16   SpO2 98%   Physical Exam Vitals and nursing note reviewed.  Constitutional:      General: She is not in acute distress.    Appearance: She is not ill-appearing, toxic-appearing or diaphoretic.  HENT:     Head: Normocephalic.  Eyes:     General: No scleral icterus.       Right eye: No discharge.        Left eye: No discharge.  Cardiovascular:     Rate and Rhythm: Normal rate.  Pulmonary:     Effort: Pulmonary effort is normal.  Skin:    General: Skin is warm and dry.     Comments: Abscess to right axilla with palpable area of fluctuance and surrounding induration.  No drainage observed.  Erythema surrounding abscess.  Neurological:     General: No focal deficit present.     Mental Status: She is alert.  Psychiatric:        Behavior: Behavior is cooperative.      ED Results / Procedures / Treatments   Labs (all labs ordered are listed, but only abnormal results are displayed) Labs Reviewed - No data to display  EKG None  Radiology No results found.  Procedures Procedures   Medications Ordered in ED Medications - No data to display  ED Course  I have reviewed the triage vital signs and the nursing notes.  Pertinent labs & imaging results that were available during my care of the patient were reviewed by me and considered in my medical decision making (see chart for details).    MDM Rules/Calculators/A&P                          Alert 30 year old female no acute distress, nontoxic-appearing.  Patient presents with chief complaint of abscess to right axilla.  Abscess has been present since Sunday.  No relief of symptoms with warm compress.  No drainage from abscess.  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  On physical exam abscess to right axilla with palpable area of fluctuance and surrounding induration.  No drainage observed.  Erythema surrounding abscess.  Discussed incision and drainage of abscess with patient.  Patient elects to defer this procedure at this time.  Will cover patient with 7-day course of Keflex for possible associated cellulitis.  Patient advised to continue warm compresses.  Patient given information to follow-up with [facility].  Patient given strict return precautions.  Patient expressed understanding of all instructions and is agreeable with this plan.   Final Clinical Impression(s) / ED Diagnoses Final diagnoses:  Abscess    Rx / DC Orders ED Discharge Orders         Ordered    cephALEXin (KEFLEX) 500 MG capsule  4 times daily,   Status:  Discontinued        05/25/21 2005    cephALEXin (KEFLEX) 500 MG capsule  4 times daily        05 /26/22 2024           2025, PA-C 05/26/21 0120    05/28/21, MD 05/27/21  [phone]

## 2021-05-25 NOTE — ED Triage Notes (Signed)
Pt arrives POV with complaints of a boil in her right under arm. Pt reports having recurrent boils that usually get drained. Pain 10/10.

## 2021-07-18 ENCOUNTER — Encounter (HOSPITAL_BASED_OUTPATIENT_CLINIC_OR_DEPARTMENT_OTHER): Payer: Self-pay

## 2021-07-18 ENCOUNTER — Emergency Department (HOSPITAL_BASED_OUTPATIENT_CLINIC_OR_DEPARTMENT_OTHER)
Admission: EM | Admit: 2021-07-18 | Discharge: 2021-07-18 | Discharge status: Against Medical Advice | Payer: Medicaid Other | Attending: Emergency Medicine | Admitting: Emergency Medicine

## 2021-07-18 ENCOUNTER — Other Ambulatory Visit: Payer: Self-pay

## 2021-07-18 ENCOUNTER — Emergency Department (HOSPITAL_BASED_OUTPATIENT_CLINIC_OR_DEPARTMENT_OTHER): Payer: Medicaid Other

## 2021-07-18 DIAGNOSIS — R0602 Shortness of breath: Secondary | ICD-10-CM | POA: Insufficient documentation

## 2021-07-18 DIAGNOSIS — J449 Chronic obstructive pulmonary disease, unspecified: Secondary | ICD-10-CM | POA: Diagnosis not present

## 2021-07-18 DIAGNOSIS — G43909 Migraine, unspecified, not intractable, without status migrainosus: Secondary | ICD-10-CM | POA: Diagnosis not present

## 2021-07-18 DIAGNOSIS — R110 Nausea: Secondary | ICD-10-CM | POA: Diagnosis present

## 2021-07-18 DIAGNOSIS — F17210 Nicotine dependence, cigarettes, uncomplicated: Secondary | ICD-10-CM | POA: Diagnosis not present

## 2021-07-18 DIAGNOSIS — G43009 Migraine without aura, not intractable, without status migrainosus: Secondary | ICD-10-CM

## 2021-07-18 LAB — BASIC METABOLIC PANEL
Anion gap: 4 — ABNORMAL LOW (ref 5–15)
BUN: 12 mg/dL (ref 6–20)
CO2: 28 mmol/L (ref 22–32)
Calcium: 8.2 mg/dL — ABNORMAL LOW (ref 8.9–10.3)
Chloride: 103 mmol/L (ref 98–111)
Creatinine, Ser: 0.73 mg/dL (ref 0.44–1.00)
GFR, Estimated: >60 mL/min (ref >60)
Glucose, Bld: 101 mg/dL — ABNORMAL HIGH (ref 70–99)
Potassium: 3.9 mmol/L (ref 3.5–5.1)
Sodium: 135 mmol/L (ref 135–145)

## 2021-07-18 LAB — CBC WITH DIFFERENTIAL/PLATELET
Abs Immature Granulocytes: 0.01 10*3/uL (ref 0.00–0.07)
Basophils Absolute: 0.1 10*3/uL (ref 0.0–0.1)
Basophils Relative: 1 %
Eosinophils Absolute: 0.2 10*3/uL (ref 0.0–0.5)
Eosinophils Relative: 5 %
HCT: 34 % — ABNORMAL LOW (ref 36.0–46.0)
Hemoglobin: 11 g/dL — ABNORMAL LOW (ref 12.0–15.0)
Immature Granulocytes: 0 %
Lymphocytes Relative: 39 %
Lymphs Abs: 1.7 10*3/uL (ref 0.7–4.0)
MCH: 27.8 pg (ref 26.0–34.0)
MCHC: 32.4 g/dL (ref 30.0–36.0)
MCV: 86.1 fL (ref 80.0–100.0)
Monocytes Absolute: 0.4 10*3/uL (ref 0.1–1.0)
Monocytes Relative: 10 %
Neutro Abs: 2 10*3/uL (ref 1.7–7.7)
Neutrophils Relative %: 45 %
Platelets: 257 10*3/uL (ref 150–400)
RBC: 3.95 MIL/uL (ref 3.87–5.11)
RDW: 14 % (ref 11.5–15.5)
WBC: 4.4 10*3/uL (ref 4.0–10.5)
nRBC: 0 % (ref 0.0–0.2)

## 2021-07-18 LAB — BRAIN NATRIURETIC PEPTIDE: B Natriuretic Peptide: 18.9 pg/mL (ref 0.0–100.0)

## 2021-07-18 MED ORDER — SODIUM CHLORIDE 0.9 % IV BOLUS
1000.0000 mL | Freq: Once | INTRAVENOUS | Status: AC
  Administered 2021-07-18: 1000 mL via INTRAVENOUS

## 2021-07-18 MED ORDER — KETOROLAC TROMETHAMINE 15 MG/ML IJ SOLN
15.0000 mg | Freq: Once | INTRAMUSCULAR | Status: AC
  Administered 2021-07-18: 15 mg via INTRAVENOUS
  Filled 2021-07-18: qty 1

## 2021-07-18 MED ORDER — METOCLOPRAMIDE HCL 5 MG/ML IJ SOLN
10.0000 mg | Freq: Once | INTRAMUSCULAR | Status: AC
  Administered 2021-07-18: 10 mg via INTRAVENOUS
  Filled 2021-07-18: qty 2

## 2021-07-18 NOTE — ED Notes (Signed)
Pt activated the call light, and this RN entered rooom to check on pt. Pt questioning about the B/P cuff. Advised pt the cuff squeezes tight but then will slowly release to keep track of her B/P. Pt suddenly without warning became hostile yelling at this RN and stating she wants to go. Pt's primary RN notified.

## 2021-07-18 NOTE — ED Triage Notes (Signed)
Pt c/o migraine x 2 weeks-SOB x 1 week-NAD-steady gait

## 2021-07-18 NOTE — ED Notes (Signed)
Patient transported to X-ray 

## 2021-07-18 NOTE — ED Provider Notes (Signed)
[facility] EMERGENCY DEPARTMENT Provider Note   CSN: [id number] Arrival date & time: 07/18/21  1619     History Chief Complaint  Patient presents with   Migraine    Kristen Haney is a 30 y.o. female.  Patient presents with migraine x2 weeks.  There is associated nausea and photophobia, but no vomiting.  She has tried ibuprofen without relief.  She has had a history of the same, but does not remember what typically is helped in the past.  Patient also presents with shortness of breath x6 days.  She is COVID vaccinated, no known exposures.  She has an inhaler but does not know where her inhaler is since moving.  Denies H/O asthma but reports diagnosis of COPD in the past. She does not complain of any cough, body aches, congestion.  Shortness of breath is worse when she lays down flat at night.  There is no chest pain or leg swelling.  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   Intentional drug overdose (HCC)    Sickle cell trait (HCC)     Past Surgical History:  Procedure Laterality Date   NONE     TUBAL LIGATION       OB History     Gravida  3   Para  1   Term  1   Preterm      AB  1   Living  1      SAB      IAB  1   Ectopic      Multiple      Live Births  1           Family History  Problem Relation Age of Onset   Hypertension Mother    Cancer Maternal Aunt        PANCREATIC   Stroke Paternal Grandmother    Asthma Son     Social History   Tobacco Use   Smoking status: Every Day    Packs/day: 0.50    Types: Cigarettes   Smokeless tobacco: Never  Vaping Use   Vaping Use: Never used  Substance  Use Topics   Alcohol use: No   Drug use: Yes    Types: Marijuana    Home Medications Prior to Admission medications   Medication Sig Start Date End Date Taking? Authorizing Provider  escitalopram (LEXAPRO) 5 MG tablet Take 5 mg by mouth daily.    [provider]  lidocaine (LIDODERM) 5 % Place 1 patch onto the skin daily. Remove & Discard patch within 12 hours or as directed by MD 04/09/21   Harlene Salts A, PA-C  naproxen (NAPROSYN) 500 MG tablet Take 1 tablet (500 mg total) by mouth 2 (two) times daily. 04/06/21   Vanetta Mulders, MD    Allergies    Patient has no known allergies.  Review of Systems   Review of Systems  Constitutional:  Negative for chills and fever.  Eyes:  Positive for photophobia.  Respiratory:  Positive for shortness of breath. Negative for cough and wheezing.   Cardiovascular:  Negative for chest pain and leg swelling.  Gastrointestinal:  Positive  for nausea. Negative for abdominal pain and vomiting.  Neurological:  Positive for headaches.   Physical Exam Updated Vital Signs BP 111/87 (BP Location: Left Arm)   Pulse 78   Temp 98.7 F (37.1 C) (Oral)   Resp 18   Ht 5\' 8"  (1.727 m)   Wt 82.1 kg   LMP 07/13/2021   SpO2 100%   BMI 27.52 kg/m   Physical Exam Vitals and nursing note reviewed. Exam conducted with a chaperone present.  Constitutional:      Appearance: Normal appearance.  HENT:     Head: Normocephalic and atraumatic.  Eyes:     General: No scleral icterus.       Right eye: No discharge.        Left eye: No discharge.     Extraocular Movements: Extraocular movements intact.     Pupils: Pupils are equal, round, and reactive to light.  Cardiovascular:     Rate and Rhythm: Normal rate and regular rhythm.     Pulses: Normal pulses.     Heart sounds: Normal heart sounds. No murmur heard.   No friction rub. No gallop.  Pulmonary:     Effort: Pulmonary effort is normal. No respiratory distress.     Breath sounds: Normal  breath sounds.     Comments: Lungs are clear to auscultation bilaterally.  There is no wheezing in any of the lung fields with inspiration, expiration, with forced exhalation.  No accessory muscle use Abdominal:     General: Abdomen is flat. Bowel sounds are normal. There is no distension.     Palpations: Abdomen is soft.     Tenderness: There is no abdominal tenderness.  Skin:    General: Skin is warm and dry.     Coloration: Skin is not jaundiced.  Neurological:     Mental Status: She is alert. Mental status is at baseline.     Coordination: Coordination normal.     Comments: Cranial nerves II through XII are grossly intact.  Strength 5/5 to upper extremities and lower extremities bilaterally    ED Results / Procedures / Treatments   Labs (all labs ordered are listed, but only abnormal results are displayed) Labs Reviewed - No data to display  EKG None  Radiology No results found.  Procedures Procedures   Medications Ordered in ED Medications - No data to display  ED Course  I have reviewed the triage vital signs and the nursing notes.  Pertinent labs & imaging results that were available during my care of the patient were reviewed by me and considered in my medical decision making (see chart for details).  Clinical Course as of 07/18/21 2356  Tue Jul 18, 2021  1726 CBC with Differential(!) No leukocytosis, no anemia  [HS]  1726 Basic metabolic panel(!) No electrolyte derangement, AKI   [HS]  1726 Patient is requesting to leave.  States that she does not get along with the nurse, states she does not want to go to jail for "getting out of it" as if there is anything else we can do to help relieve, she says no she is out of here.  I told her that if any of her labs or imaging comes back atypical I will prescribe medicine to treat it, she has MyChart and states she will follow-up. [HS]    Clinical Course User Index [HS] Jul 20, 2021, PA-C   MDM Rules/Calculators/A&P  This is a patient is having a classic migraine given history of the same and no focal deficits on exam.  She has also had no head trauma so I do not think a CT head would be helpful.  I will check basic labs and try to treat migraine with symptomatic pain medicine. Patient is also complaining of shortness of breath, but she satting at 100% on room air.  There is a question of COPD, but I do not see history of the same in her medical chart.  She is also very young for COPD diagnosis.  We will get a chest x-ray and check shortness of breath labs.  Patient could have a pneumonia or COVID-19 infection, but she is vaccinated which makes COVID less likely.  I do not see signs of pneumonia or any type of disease that needs treatment outpatient.  I believe she is appropriate for discharge and I hope her migraine has resolved. Final Clinical Impression(s) / ED Diagnoses Final diagnoses:  None    Rx / DC Orders ED Discharge Orders     None        Theron Arista, PA-C 07/18/21 2356    Virgina Norfolk, DO 07/19/21 1522

## 2021-08-06 ENCOUNTER — Encounter (HOSPITAL_COMMUNITY): Payer: Self-pay

## 2021-08-06 ENCOUNTER — Emergency Department (HOSPITAL_COMMUNITY): Payer: Medicaid Other

## 2021-08-06 ENCOUNTER — Other Ambulatory Visit: Payer: Self-pay

## 2021-08-06 ENCOUNTER — Emergency Department (HOSPITAL_COMMUNITY)
Admission: EM | Admit: 2021-08-06 | Discharge: 2021-08-06 | Disposition: A | Discharge status: Home / Self Care | Payer: Medicaid Other | Attending: Emergency Medicine | Admitting: Emergency Medicine

## 2021-08-06 DIAGNOSIS — Z20822 Contact with and (suspected) exposure to covid-19: Secondary | ICD-10-CM | POA: Insufficient documentation

## 2021-08-06 DIAGNOSIS — R112 Nausea with vomiting, unspecified: Secondary | ICD-10-CM | POA: Diagnosis not present

## 2021-08-06 DIAGNOSIS — R0602 Shortness of breath: Secondary | ICD-10-CM | POA: Diagnosis not present

## 2021-08-06 DIAGNOSIS — N390 Urinary tract infection, site not specified: Secondary | ICD-10-CM | POA: Insufficient documentation

## 2021-08-06 DIAGNOSIS — B9689 Other specified bacterial agents as the cause of diseases classified elsewhere: Secondary | ICD-10-CM | POA: Diagnosis not present

## 2021-08-06 DIAGNOSIS — R110 Nausea: Secondary | ICD-10-CM

## 2021-08-06 DIAGNOSIS — K5900 Constipation, unspecified: Secondary | ICD-10-CM | POA: Diagnosis not present

## 2021-08-06 DIAGNOSIS — F17210 Nicotine dependence, cigarettes, uncomplicated: Secondary | ICD-10-CM | POA: Insufficient documentation

## 2021-08-06 DIAGNOSIS — R1033 Periumbilical pain: Secondary | ICD-10-CM | POA: Diagnosis present

## 2021-08-06 LAB — URINALYSIS, ROUTINE W REFLEX MICROSCOPIC
Bilirubin Urine: NEGATIVE
Glucose, UA: NEGATIVE mg/dL
Hgb urine dipstick: NEGATIVE
Ketones, ur: NEGATIVE mg/dL
Nitrite: NEGATIVE
Protein, ur: NEGATIVE mg/dL
Specific Gravity, Urine: 1.011 (ref 1.005–1.030)
pH: 6 (ref 5.0–8.0)

## 2021-08-06 LAB — CBC WITH DIFFERENTIAL/PLATELET
Abs Immature Granulocytes: 0.01 10*3/uL (ref 0.00–0.07)
Basophils Absolute: 0 10*3/uL (ref 0.0–0.1)
Basophils Relative: 1 %
Eosinophils Absolute: 0.2 10*3/uL (ref 0.0–0.5)
Eosinophils Relative: 4 %
HCT: 40.2 % (ref 36.0–46.0)
Hemoglobin: 13.3 g/dL (ref 12.0–15.0)
Immature Granulocytes: 0 %
Lymphocytes Relative: 32 %
Lymphs Abs: 1.2 10*3/uL (ref 0.7–4.0)
MCH: 28.4 pg (ref 26.0–34.0)
MCHC: 33.1 g/dL (ref 30.0–36.0)
MCV: 85.7 fL (ref 80.0–100.0)
Monocytes Absolute: 0.4 10*3/uL (ref 0.1–1.0)
Monocytes Relative: 11 %
Neutro Abs: 1.9 10*3/uL (ref 1.7–7.7)
Neutrophils Relative %: 52 %
Platelets: 256 10*3/uL (ref 150–400)
RBC: 4.69 MIL/uL (ref 3.87–5.11)
RDW: 14 % (ref 11.5–15.5)
WBC: 3.7 10*3/uL — ABNORMAL LOW (ref 4.0–10.5)
nRBC: 0 % (ref 0.0–0.2)

## 2021-08-06 LAB — POC URINE PREG, ED: Preg Test, Ur: NEGATIVE

## 2021-08-06 LAB — RESP PANEL BY RT-PCR (FLU A&B, COVID) ARPGX2
Influenza A by PCR: NEGATIVE
Influenza B by PCR: NEGATIVE
SARS Coronavirus 2 by RT PCR: NEGATIVE

## 2021-08-06 LAB — COMPREHENSIVE METABOLIC PANEL
ALT: 23 U/L (ref 0–44)
AST: 21 U/L (ref 15–41)
Albumin: 4.2 g/dL (ref 3.5–5.0)
Alkaline Phosphatase: 56 U/L (ref 38–126)
Anion gap: 8 (ref 5–15)
BUN: 13 mg/dL (ref 6–20)
CO2: 26 mmol/L (ref 22–32)
Calcium: 9.5 mg/dL (ref 8.9–10.3)
Chloride: 102 mmol/L (ref 98–111)
Creatinine, Ser: 0.7 mg/dL (ref 0.44–1.00)
GFR, Estimated: >60 mL/min (ref >60)
Glucose, Bld: 92 mg/dL (ref 70–99)
Potassium: 4.5 mmol/L (ref 3.5–5.1)
Sodium: 136 mmol/L (ref 135–145)
Total Bilirubin: 0.4 mg/dL (ref 0.3–1.2)
Total Protein: 8.2 g/dL — ABNORMAL HIGH (ref 6.5–8.1)

## 2021-08-06 LAB — LIPASE, BLOOD: Lipase: 30 U/L (ref 11–51)

## 2021-08-06 MED ORDER — ALBUTEROL SULFATE HFA 108 (90 BASE) MCG/ACT IN AERS: 1.0000 | INHALATION_SPRAY | Freq: Four times a day (QID) | RESPIRATORY_TRACT | 0 refills | Status: AC | PRN

## 2021-08-06 MED ORDER — ONDANSETRON 4 MG PO TBDP: 4.0000 mg | ORAL_TABLET | Freq: Three times a day (TID) | ORAL | 0 refills | Status: DC | PRN

## 2021-08-06 MED ORDER — CEPHALEXIN 500 MG PO CAPS: 500.0000 mg | ORAL_CAPSULE | Freq: Two times a day (BID) | ORAL | 0 refills | Status: AC

## 2021-08-06 NOTE — Discharge Instructions (Addendum)
You came to the emergency department today to be evaluated for your nausea.  Your physical exam was reassuring.  Your lab work showed a possible urinary tract infection.  Due to the this finding with your urinary frequency I have started you on the medication Keflex to treat a urinary tract infection.  I have also given you prescription for Zofran to help with your nausea.  As mentioned please follow-up with urologist regarding the findings from your CT scan in April of this year.  Please follow-up with your primary care provider for further management of your asthma.  Get help right away if: You have pain in your chest, neck, arm, or jaw. You feel extremely weak or you faint. You have vomit that is bright red or looks like coffee grounds. You have bloody or black stools or stools that look like tar. You have a severe headache, a stiff neck, or both. You have severe pain, cramping, or bloating in your abdomen. You have difficulty breathing or are breathing very quickly. Your heart is beating very quickly. Your skin feels cold and clammy. You feel confused. You have signs of dehydration, such as: Dark urine, very little urine, or no urine. Cracked lips. Dry mouth. Sunken eyes. Sleepiness. Weakness.

## 2021-08-06 NOTE — ED Provider Notes (Signed)
[facility]-EMERGENCY DEPT Provider Note   CSN: [id number] Arrival date & time: 08/06/21  6063     History Chief Complaint  Patient presents with   Emesis    Kristen Haney is a 30 y.o. female with history of sickle cell trait, migraines, depression.  Patient presents to the emergency department with a chief complaint of abdominal pain, nausea, and vomiting.  Patient states that approximately 1 week prior she had some pork loin which she is concerned may have been bad.  Patient reports that she developed nausea and vomiting starting on Monday.  Patient has had episodes of vomiting since then.  Patient reports vomiting twice in the last 24 hours.  Patient describes emesis as bilious, denies any hematemesis or coffee-ground emesis.  Patient reports periumbilical abdominal pain.  Patient describes pain as "tight "sharp "and dull."  Abdominal pain is intermittent.  Pain is worse after eating.  Pain is improved with hot tea.  Patient denies any pain at present.  Patient also endorses constipation.  Patient's that she took "laxatives," and was able to have a bowel movement while in emergency department.  Patient denies any blood in stool or melena.  Patient endorses urinary frequency and chills.  Additionally patient reports that she lost her albuterol inhaler and is requesting a refill.  Patient reports mild shortness of breath since losing her inhaler.  Patient denies any nasal congestion, rhinorrhea, sore throat, cough  Patient denies any dysuria, hematuria, vaginal pain, vaginal bleeding, vaginal discharge.  LMP 7/14.  Status post tubal ligation.  G3 P2-0-1-2 patient denies any history of abdominal surgeries.   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   Intentional drug overdose (HCC)    Sickle cell trait (HCC)     Past Surgical History:  Procedure Laterality Date   NONE     TUBAL LIGATION       OB History     Gravida  3   Para  1   Term  1   Preterm      AB  1   Living  1      SAB      IAB  1   Ectopic      Multiple      Live Births  1           Family History  Problem Relation Age of Onset   Hypertension Mother    Cancer Maternal Aunt        PANCREATIC   Stroke Paternal Grandmother    Asthma Son     Social History   Tobacco Use   Smoking status: Every Day    Packs/day: 0.50    Types: Cigarettes   Smokeless tobacco: Never  Vaping Use   Vaping Use: Never used  Substance Use Topics   Alcohol use: No   Drug use: Yes    Types: Marijuana    Home Medications Prior to Admission medications   Medication Sig  Start Date End Date Taking? Authorizing Provider  albuterol (VENTOLIN HFA) 108 (90 Base) MCG/ACT inhaler Inhale 1-2 puffs into the lungs every 6 (six) hours as needed for wheezing or shortness of breath. 08/06/21  Yes Haskel Schroeder, PA-C  cephALEXin (KEFLEX) 500 MG capsule Take 1 capsule (500 mg total) by mouth 2 (two) times daily for 7 days. 08/06/21 08/13/21 Yes Whitney Bingaman, Rose Phi, PA-C  ibuprofen (ADVIL) 200 MG tablet Take 400 mg by mouth every 6 (six) hours as needed for headache or mild pain.   Yes [provider]  ondansetron (ZOFRAN ODT) 4 MG disintegrating tablet Take 1 tablet (4 mg total) by mouth every 8 (eight) hours as needed for nausea or vomiting. 08/06/21  Yes Haskel Schroeder, PA-C  escitalopram (LEXAPRO) 5 MG tablet Take 5 mg by mouth daily. Patient not taking: Reported on 08/06/2021    [provider]  lidocaine (LIDODERM) 5 % Place 1 patch onto the skin daily. Remove & Discard patch within 12 hours or as directed by MD Patient not taking: Reported on 08/06/2021 04/09/21   Harlene Salts A, PA-C  naproxen (NAPROSYN) 500 MG tablet Take 1 tablet (500 mg total) by mouth 2 (two) times daily. Patient not taking: Reported on 08/06/2021 04/06/21   Vanetta Mulders, MD    Allergies    Patient has no known allergies.  Review of Systems   Review of Systems  Constitutional:  Positive for chills. Negative for fever.  HENT:  Negative for congestion, rhinorrhea and sore throat.   Eyes:  Negative for visual disturbance.  Respiratory:  Positive for shortness of breath. Negative for cough.   Cardiovascular:  Negative for chest pain and leg swelling.  Gastrointestinal:  Positive for abdominal pain, constipation, nausea and vomiting. Negative for abdominal distention, anal bleeding, blood in stool, diarrhea and rectal pain.  Genitourinary:  Positive for frequency. Negative for decreased urine volume, difficulty urinating, dysuria, flank pain, genital sores, vaginal bleeding, vaginal discharge and vaginal pain.  Musculoskeletal:  Negative for back pain and neck pain.  Skin:  Negative for color change and rash.  Neurological:  Negative for dizziness, syncope, light-headedness and headaches.  Psychiatric/Behavioral:  Negative for confusion.    Physical Exam Updated Vital Signs BP 111/73 (BP Location: Right Arm)   Pulse 85   Temp 98.6 F (37 C) (Oral)   Resp 18   Ht 5\' 8"  (1.727 m)   Wt 82 kg   LMP 07/13/2021   SpO2 100%   BMI 27.49 kg/m   Physical Exam Vitals and nursing note reviewed.  Constitutional:      General: She is not in acute distress.    Appearance: She is not ill-appearing, toxic-appearing or diaphoretic.  HENT:     Head: Normocephalic.  Eyes:     General: No scleral icterus.       Right eye: No discharge.        Left eye: No discharge.  Cardiovascular:     Rate  and Rhythm: Normal rate.     Heart sounds: Normal heart sounds.  Pulmonary:     Effort: Pulmonary effort is normal. No tachypnea, bradypnea or respiratory distress.     Breath sounds: Normal breath sounds. No stridor.     Comments: Patient speaks in full complete sentences without difficulty Abdominal:     General: Abdomen is flat. Bowel sounds are increased. There is no distension. There are no signs of injury.     Palpations: Abdomen is soft. There is no  mass or pulsatile mass.     Tenderness: There is no abdominal tenderness. There is no right CVA tenderness, left CVA tenderness, guarding or rebound.     Hernia: There is no hernia in the umbilical area or ventral area.  Musculoskeletal:     Cervical back: Neck supple.     Right lower leg: No edema.     Left lower leg: No edema.  Skin:    General: Skin is warm and dry.     Coloration: Skin is not jaundiced or pale.  Neurological:     General: No focal deficit present.     Mental Status: She is alert.  Psychiatric:        Behavior: Behavior is cooperative.    ED Results / Procedures / Treatments   Labs (all labs ordered are listed, but only abnormal results are displayed) Labs Reviewed  CBC WITH DIFFERENTIAL/PLATELET - Abnormal; Notable for the following components:      Result Value   WBC 3.7 (*)    All other components within normal limits  COMPREHENSIVE METABOLIC PANEL - Abnormal; Notable for the following components:   Total Protein 8.2 (*)    All other components within normal limits  URINALYSIS, ROUTINE W REFLEX MICROSCOPIC - Abnormal; Notable for the following components:   APPearance HAZY (*)    Leukocytes,Ua LARGE (*)    Bacteria, UA RARE (*)    All other components within normal limits  RESP PANEL BY RT-PCR (FLU A&B, COVID) ARPGX2  LIPASE, BLOOD  POC URINE PREG, ED    EKG None  Radiology DG Chest Portable 1 View  Result Date: 08/06/2021 CLINICAL DATA:  Shortness of breath for 1 week. EXAM: PORTABLE CHEST  1 VIEW COMPARISON:  07/18/2021 prior radiographs FINDINGS: The cardiomediastinal silhouette is unremarkable. There is no evidence of focal airspace disease, pulmonary edema, suspicious pulmonary nodule/mass, pleural effusion, or pneumothorax. No acute bony abnormalities are identified. IMPRESSION: No active disease. Electronically Signed   By: Harmon PierJeffrey  Hu M.D.   On: 08/06/2021 10:21    Procedures Procedures   Medications Ordered in ED Medications - No data to display  ED Course  I have reviewed the triage vital signs and the nursing notes.  Pertinent labs & imaging results that were available during my care of the patient were reviewed by me and considered in my medical decision making (see chart for details).    MDM Rules/Calculators/A&P                           Alert 30 year old female no acute distress, nontoxic appearing.  Presents to the emergency department with a chief complaint of nausea, vomiting, and abdominal pain.    Abdomen soft, nondistended, nontender, no guarding, no rebound tenderness, no mass, no pulsatile mass, no CVA tenderness.  Urine pregnancy test negative CBC, CMP, lipase unremarkable Urinalysis shows bacteria rare, squamous epithelial cells 6-10, leukocytes large.  Repeat examination abdomen remains soft, nondistended, nontender.  Patient has no episodes of vomiting 1 large department.  Will p.o. challenge.  Patient able to handle p.o. challenge without vomiting or nausea.  Will discharge patient with ODT Zofran as she was unable to tolerate p.o. Zofran at home.  Due to patient's reported urinary frequency and bacteria present in urinalysis we will treat with 7-day course of Keflex.  Previous urine culture grew out E. coli.  Additionally patient also complains of mild shortness of breath after losing her albuterol inhaler.  Lungs clear  to auscultation bilaterally.  Patient able speak in full complete sentences without difficulty. Chest x-ray shows no active  cardiopulmonary disease. Respiratory panel negative for COVID-19 and influenza. Will prescribe patient with albuterol inhaler.  Patient advised to follow-up with primary care provider for further management of her asthma.  Discussed results, findings, treatment and follow up. Patient advised of return precautions. Patient verbalized understanding and agreed with plan.    Final Clinical Impression(s) / ED Diagnoses Final diagnoses:  Urinary tract infection without hematuria, site unspecified  Nausea    Rx / DC Orders ED Discharge Orders          Ordered    ondansetron (ZOFRAN ODT) 4 MG disintegrating tablet  Every 8 hours PRN        08/06/21 1255    cephALEXin (KEFLEX) 500 MG capsule  2 times daily        08/06/21 1255    albuterol (VENTOLIN HFA) 108 (90 Base) MCG/ACT inhaler  Every 6 hours PRN        08/06/21 1257             Berneice Heinrich 08/06/21 2238    Gerhard Munch, MD 08/07/21 1102

## 2021-08-06 NOTE — ED Notes (Signed)
Patient refused covid swab

## 2021-08-06 NOTE — ED Triage Notes (Signed)
Per patient vomiting,headache, shob x 1 week .

## 2021-08-06 NOTE — ED Provider Notes (Signed)
Emergency Medicine Provider Triage Evaluation Note  Kristen Haney , a 30 y.o. female  was evaluated in triage.  Pt complains of nausea vomiting abdominal pain.  Patient reports that around 1 week ago she had some pork loin which she feels may have been bad.  Patient reports that the next day, Monday she developed nausea and vomiting.  She reports she has been nauseous and had intermittent emesis for the past 6 days.  She attempted Zofran x1 without relief.  She reports a tight feeling in her abdomen whenever she eats.  Additionally patient reports that she has lost her albuterol inhaler and would like a refill today.  She reports a mild shortness of breath since running out of her inhaler last week.  Review of Systems  Positive: Abdominal pain, nausea/vomiting Negative: Fever, chills, neck stiffness, chest pain, cough/hemoptysis, extremity swelling/color change or any additional concerns  Physical Exam  BP 124/85 (BP Location: Right Arm)   Pulse 85   Temp 98.7 F (37.1 C) (Oral)   Resp 16   Ht 5\' 8"  (1.727 m)   Wt 82 kg   LMP 07/13/2021   SpO2 100%   BMI 27.49 kg/m  Gen:   Awake, no distress   Resp:  Normal effort  MSK:   Moves extremities without difficulty  Other:  Abdomen soft nontender without peritoneal signs.  Medical Decision Making  Medically screening exam initiated at 9:44 AM.  Appropriate orders placed.  Tyrese Ficek was informed that the remainder of the evaluation will be completed by another provider, this initial triage assessment does not replace that evaluation, and the importance of remaining in the ED until their evaluation is complete.   Note: Portions of this report may have been transcribed using voice recognition software. Every effort was made to ensure accuracy; however, inadvertent computerized transcription errors may still be present.     Gomez Cleverly, PA-C 08/06/21 0945    10/06/21, MD 08/07/21 1102

## 2021-10-10 DIAGNOSIS — L732 Hidradenitis suppurativa: Secondary | ICD-10-CM | POA: Diagnosis not present

## 2021-11-14 ENCOUNTER — Encounter (HOSPITAL_COMMUNITY): Payer: Self-pay

## 2021-11-14 ENCOUNTER — Emergency Department (HOSPITAL_COMMUNITY)
Admission: EM | Admit: 2021-11-14 | Discharge: 2021-11-14 | Disposition: A | Discharge status: Home / Self Care | Payer: BC Managed Care – PPO | Attending: Emergency Medicine | Admitting: Emergency Medicine

## 2021-11-14 DIAGNOSIS — R1110 Vomiting, unspecified: Secondary | ICD-10-CM | POA: Diagnosis not present

## 2021-11-14 DIAGNOSIS — R109 Unspecified abdominal pain: Secondary | ICD-10-CM | POA: Diagnosis not present

## 2021-11-14 DIAGNOSIS — Z5321 Procedure and treatment not carried out due to patient leaving prior to being seen by health care provider: Secondary | ICD-10-CM | POA: Insufficient documentation

## 2021-11-14 DIAGNOSIS — R509 Fever, unspecified: Secondary | ICD-10-CM | POA: Insufficient documentation

## 2021-11-14 DIAGNOSIS — R197 Diarrhea, unspecified: Secondary | ICD-10-CM | POA: Insufficient documentation

## 2021-11-14 NOTE — ED Provider Notes (Signed)
Emergency Medicine Provider Triage Evaluation Note  Kristen Haney , a 30 y.o. female  was evaluated in triage.  Pt ate [place] food last night and began to vomit at 3 AM this morning.  Has not stopped vomiting since.  Also with diarrhea.  Also reports fever of 102.    Review of Systems  As above  Physical Exam  BP 125/72 (BP Location: Right Arm)   Pulse 94   Temp (!) 100.4 F (38 C) (Oral)   Resp 16   LMP 11/14/2021 (Approximate)   SpO2 99%  Gen:   Awake, no distress   Resp:  Normal effort  MSK:   Moves extremities without difficulty  Other:    Medical Decision Making  Medically screening exam initiated at 12:03 PM.  Appropriate orders placed.  Darnita Woodrum was informed that the remainder of the evaluation will be completed by another provider, this initial triage assessment does not replace that evaluation, and the importance of remaining in the ED until their evaluation is complete.    Woodroe Chen 11/14/21 1204    Lorre Nick, MD 11/15/21 [phone]

## 2021-11-14 NOTE — ED Triage Notes (Signed)
Pt presents with c/o abdominal pain and vomiting. Pt reports the symptoms started after she ate [place] food last night. Pt reports a fever and chills as well.

## 2021-11-17 DIAGNOSIS — R509 Fever, unspecified: Secondary | ICD-10-CM | POA: Diagnosis not present

## 2021-11-17 DIAGNOSIS — R0981 Nasal congestion: Secondary | ICD-10-CM | POA: Diagnosis not present

## 2021-11-17 DIAGNOSIS — R059 Cough, unspecified: Secondary | ICD-10-CM | POA: Diagnosis not present

## 2021-11-17 DIAGNOSIS — Z20822 Contact with and (suspected) exposure to covid-19: Secondary | ICD-10-CM | POA: Diagnosis not present

## 2021-11-17 DIAGNOSIS — R519 Headache, unspecified: Secondary | ICD-10-CM | POA: Diagnosis not present

## 2021-11-17 DIAGNOSIS — J101 Influenza due to other identified influenza virus with other respiratory manifestations: Secondary | ICD-10-CM | POA: Diagnosis not present

## 2021-11-17 DIAGNOSIS — R112 Nausea with vomiting, unspecified: Secondary | ICD-10-CM | POA: Diagnosis not present

## 2021-11-17 DIAGNOSIS — R197 Diarrhea, unspecified: Secondary | ICD-10-CM | POA: Diagnosis not present

## 2021-11-18 ENCOUNTER — Encounter (HOSPITAL_BASED_OUTPATIENT_CLINIC_OR_DEPARTMENT_OTHER): Payer: Self-pay

## 2021-11-18 ENCOUNTER — Other Ambulatory Visit: Payer: Self-pay

## 2021-11-18 ENCOUNTER — Emergency Department (HOSPITAL_BASED_OUTPATIENT_CLINIC_OR_DEPARTMENT_OTHER)
Admission: EM | Admit: 2021-11-18 | Discharge: 2021-11-19 | Disposition: A | Discharge status: Home / Self Care | Payer: BC Managed Care – PPO | Attending: Emergency Medicine | Admitting: Emergency Medicine

## 2021-11-18 DIAGNOSIS — R112 Nausea with vomiting, unspecified: Secondary | ICD-10-CM | POA: Insufficient documentation

## 2021-11-18 DIAGNOSIS — R001 Bradycardia, unspecified: Secondary | ICD-10-CM | POA: Insufficient documentation

## 2021-11-18 DIAGNOSIS — R059 Cough, unspecified: Secondary | ICD-10-CM | POA: Insufficient documentation

## 2021-11-18 DIAGNOSIS — R1013 Epigastric pain: Secondary | ICD-10-CM | POA: Insufficient documentation

## 2021-11-18 DIAGNOSIS — R197 Diarrhea, unspecified: Secondary | ICD-10-CM | POA: Insufficient documentation

## 2021-11-18 DIAGNOSIS — M791 Myalgia, unspecified site: Secondary | ICD-10-CM | POA: Diagnosis not present

## 2021-11-18 DIAGNOSIS — J029 Acute pharyngitis, unspecified: Secondary | ICD-10-CM | POA: Insufficient documentation

## 2021-11-18 DIAGNOSIS — F17210 Nicotine dependence, cigarettes, uncomplicated: Secondary | ICD-10-CM | POA: Insufficient documentation

## 2021-11-18 DIAGNOSIS — E860 Dehydration: Secondary | ICD-10-CM | POA: Diagnosis not present

## 2021-11-18 LAB — URINALYSIS, MICROSCOPIC (REFLEX)

## 2021-11-18 LAB — COMPREHENSIVE METABOLIC PANEL
ALT: 18 U/L (ref 0–44)
AST: 23 U/L (ref 15–41)
Albumin: 4.2 g/dL (ref 3.5–5.0)
Alkaline Phosphatase: 47 U/L (ref 38–126)
Anion gap: 11 (ref 5–15)
BUN: 7 mg/dL (ref 6–20)
CO2: 27 mmol/L (ref 22–32)
Calcium: 8.7 mg/dL — ABNORMAL LOW (ref 8.9–10.3)
Chloride: 102 mmol/L (ref 98–111)
Creatinine, Ser: 0.63 mg/dL (ref 0.44–1.00)
GFR, Estimated: >60 mL/min (ref >60)
Glucose, Bld: 91 mg/dL (ref 70–99)
Potassium: 3.5 mmol/L (ref 3.5–5.1)
Sodium: 140 mmol/L (ref 135–145)
Total Bilirubin: 0.4 mg/dL (ref 0.3–1.2)
Total Protein: 8.1 g/dL (ref 6.5–8.1)

## 2021-11-18 LAB — CBC WITH DIFFERENTIAL/PLATELET
Abs Immature Granulocytes: 0.02 10*3/uL (ref 0.00–0.07)
Basophils Absolute: 0 10*3/uL (ref 0.0–0.1)
Basophils Relative: 1 %
Eosinophils Absolute: 0 10*3/uL (ref 0.0–0.5)
Eosinophils Relative: 1 %
HCT: 39.2 % (ref 36.0–46.0)
Hemoglobin: 13.1 g/dL (ref 12.0–15.0)
Immature Granulocytes: 1 %
Lymphocytes Relative: 50 %
Lymphs Abs: 1.7 10*3/uL (ref 0.7–4.0)
MCH: 28.2 pg (ref 26.0–34.0)
MCHC: 33.4 g/dL (ref 30.0–36.0)
MCV: 84.3 fL (ref 80.0–100.0)
Monocytes Absolute: 0.3 10*3/uL (ref 0.1–1.0)
Monocytes Relative: 10 %
Neutro Abs: 1.2 10*3/uL — ABNORMAL LOW (ref 1.7–7.7)
Neutrophils Relative %: 37 %
Platelets: 236 10*3/uL (ref 150–400)
RBC: 4.65 MIL/uL (ref 3.87–5.11)
RDW: 13.5 % (ref 11.5–15.5)
Smear Review: NORMAL
WBC: 3.2 10*3/uL — ABNORMAL LOW (ref 4.0–10.5)
nRBC: 0 % (ref 0.0–0.2)

## 2021-11-18 LAB — LIPASE, BLOOD: Lipase: 23 U/L (ref 11–51)

## 2021-11-18 LAB — URINALYSIS, ROUTINE W REFLEX MICROSCOPIC
Glucose, UA: NEGATIVE mg/dL
Hgb urine dipstick: NEGATIVE
Ketones, ur: >=80 mg/dL — AB
Leukocytes,Ua: NEGATIVE
Nitrite: NEGATIVE
Protein, ur: 30 mg/dL — AB
Specific Gravity, Urine: 1.02 (ref 1.005–1.030)
pH: 7 (ref 5.0–8.0)

## 2021-11-18 LAB — PREGNANCY, URINE: Preg Test, Ur: NEGATIVE

## 2021-11-18 MED ORDER — SODIUM CHLORIDE 0.9 % IV BOLUS
1000.0000 mL | Freq: Once | INTRAVENOUS | Status: AC
  Administered 2021-11-18: 1000 mL via INTRAVENOUS

## 2021-11-18 MED ORDER — ONDANSETRON HCL 4 MG/2ML IJ SOLN
4.0000 mg | Freq: Once | INTRAMUSCULAR | Status: AC
  Administered 2021-11-18: 4 mg via INTRAVENOUS
  Filled 2021-11-18: qty 2

## 2021-11-18 MED ORDER — DICYCLOMINE HCL 10 MG PO CAPS
10.0000 mg | ORAL_CAPSULE | Freq: Once | ORAL | Status: AC
  Administered 2021-11-18: 10 mg via ORAL
  Filled 2021-11-18: qty 1

## 2021-11-18 MED ORDER — MORPHINE SULFATE (PF) 4 MG/ML IV SOLN
4.0000 mg | Freq: Once | INTRAVENOUS | Status: AC
  Administered 2021-11-18: 4 mg via INTRAVENOUS
  Filled 2021-11-18: qty 1

## 2021-11-18 NOTE — ED Notes (Signed)
Pt ambulatory to restroom with steady gait. No issues observed. 

## 2021-11-18 NOTE — ED Notes (Signed)
Pt given water for PO challenge 

## 2021-11-18 NOTE — ED Provider Notes (Signed)
[facility] EMERGENCY DEPARTMENT Provider Note   CSN: [id number] Arrival date & time: 11/18/21  2124     History Chief Complaint  Patient presents with   Emesis    Kristen Haney is a 30 y.o. female.  With past medical history of migraines who presents emergency department with vomiting.  She states Monday she began vomiting and having cough, sore throat.  She states that she presented to the emergency department on Tuesday however left prior to being seen due to the wait.  She states she presented to the ED on Friday and was diagnosed with the flu.  She states since Monday she has difficulty tolerating PO including fluids. She endorses nausea, vomiting, abdominal cramping and diarrhea.  She is still having viral flulike symptoms such as coughing, sore throat and body aches.  She feels like she has intermittent shortness of breath.  She denies any fevers in the past 24 hours.  Denies chest pain, dysuria, hematuria, vaginal discharge.   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   Intentional drug overdose (HCC)    Sickle cell trait (HCC)     Past Surgical History:  Procedure Laterality Date   NONE     TUBAL LIGATION       OB History     Gravida  3   Para  1   Term  1   Preterm      AB  1   Living  1      SAB      IAB  1   Ectopic      Multiple      Live Births  1           Family History  Problem Relation Age of Onset   Hypertension Mother    Cancer Maternal Aunt        PANCREATIC    Stroke Paternal Grandmother    Asthma Son     Social History   Tobacco Use   Smoking status: Every Day    Packs/day: 0.50    Types: Cigarettes   Smokeless tobacco: Never  Vaping Use   Vaping Use: Never used  Substance Use Topics   Alcohol use: No   Drug use: Yes    Types: Marijuana    Home Medications Prior to Admission medications   Medication Sig Start Date End Date Taking? Authorizing Provider  albuterol (VENTOLIN HFA) 108 (90 Base) MCG/ACT inhaler Inhale 1-2 puffs into the lungs every 6 (six) hours as needed for wheezing or shortness of breath. 08/06/21   Haskel Schroeder, PA-C  escitalopram (LEXAPRO) 5 MG tablet Take 5 mg by mouth daily. Patient not taking: Reported on 08/06/2021    [provider]  ibuprofen (ADVIL) 200 MG tablet Take 400 mg by mouth every 6 (six) hours as needed for headache or mild pain.    [provider]  lidocaine (LIDODERM) 5 % Place 1 patch onto the skin daily. Remove & Discard patch within 12 hours or  as directed by MD Patient not taking: Reported on 08/06/2021 04/09/21   Nuala Alpha A, PA-C  naproxen (NAPROSYN) 500 MG tablet Take 1 tablet (500 mg total) by mouth 2 (two) times daily. Patient not taking: Reported on 08/06/2021 04/06/21   Fredia Sorrow, MD  ondansetron (ZOFRAN ODT) 4 MG disintegrating tablet Take 1 tablet (4 mg total) by mouth every 8 (eight) hours as needed for nausea or vomiting. 08/06/21   Loni Beckwith, PA-C    Allergies    Patient has no known allergies.  Review of Systems   Review of Systems  Constitutional:  Positive for appetite change. Negative for fever.  HENT:  Positive for sore throat.   Respiratory:  Positive for cough. Negative for shortness of breath.   Cardiovascular:  Negative for chest pain.  Gastrointestinal:  Positive for abdominal pain, diarrhea, nausea and vomiting.  Genitourinary:  Negative for dysuria, vaginal bleeding and vaginal discharge.  Musculoskeletal:  Positive for  myalgias.  All other systems reviewed and are negative.  Physical Exam Updated Vital Signs BP 131/71   Pulse (!) 50   Temp 97.8 F (36.6 C) (Oral)   Resp 18   Ht 5\' 8"  (1.727 m)   Wt 87.1 kg   LMP 11/14/2021 (Approximate)   SpO2 100%   BMI 29.19 kg/m   Physical Exam Vitals and nursing note reviewed.  Constitutional:      General: She is not in acute distress.    Appearance: Normal appearance. She is ill-appearing. She is not toxic-appearing.  HENT:     Head: Normocephalic and atraumatic.     Nose: Nose normal.     Mouth/Throat:     Mouth: Mucous membranes are moist.     Pharynx: Oropharynx is clear.  Eyes:     General: No scleral icterus.    Extraocular Movements: Extraocular movements intact.     Pupils: Pupils are equal, round, and reactive to light.  Cardiovascular:     Rate and Rhythm: Regular rhythm. Bradycardia present.     Pulses: Normal pulses.     Heart sounds: No murmur heard. Pulmonary:     Effort: Pulmonary effort is normal. No respiratory distress.     Breath sounds: Normal breath sounds.  Abdominal:     General: Abdomen is flat. Bowel sounds are normal. There is no distension.     Palpations: Abdomen is soft.     Tenderness: There is abdominal tenderness in the epigastric area and suprapubic area. There is no guarding.  Musculoskeletal:        General: Normal range of motion.     Cervical back: Normal range of motion and neck supple. No tenderness.  Skin:    General: Skin is warm and dry.     Capillary Refill: Capillary refill takes less than 2 seconds.  Neurological:     General: No focal deficit present.     Mental Status: She is alert and oriented to person, place, and time. Mental status is at baseline.  Psychiatric:        Mood and Affect: Mood normal.        Behavior: Behavior normal.    ED Results / Procedures / Treatments   Labs (all labs ordered are listed, but only abnormal results are displayed) Labs Reviewed  COMPREHENSIVE  METABOLIC PANEL - Abnormal; Notable for the following components:      Result Value   Calcium 8.7 (*)    All other components within normal limits  CBC WITH DIFFERENTIAL/PLATELET -  Abnormal; Notable for the following components:   WBC 3.2 (*)    Neutro Abs 1.2 (*)    All other components within normal limits  LIPASE, BLOOD  PREGNANCY, URINE  URINALYSIS, ROUTINE W REFLEX MICROSCOPIC   EKG None  Radiology No results found.  Procedures Procedures   Medications Ordered in ED Medications  sodium chloride 0.9 % bolus 1,000 mL (0 mLs Intravenous Stopped 11/18/21 2319)  ondansetron (ZOFRAN) injection 4 mg (4 mg Intravenous Given 11/18/21 2216)  morphine 4 MG/ML injection 4 mg (4 mg Intravenous Given 11/18/21 2324)  dicyclomine (BENTYL) capsule 10 mg (10 mg Oral Given 11/18/21 2324)   ED Course  I have reviewed the triage vital signs and the nursing notes.  Pertinent labs & imaging results that were available during my care of the patient were reviewed by me and considered in my medical decision making (see chart for details).    MDM Rules/Calculators/A&P 30 year old female who presents emergency department nausea, vomiting and abdominal pain.  Abdominal exam is without peritoneal signs.  There is no evidence of an acute abdomen at this time.  She does appear ill but not toxic.  Denies any vaginal discharge or bleeding concerning for PID or TOA.  Defer pelvic at this time. Her presentation is not consistent with ovarian torsion. Her presentation and lab work is not consistent with acute hepatobiliary disease including acute cholecystitis. Lipase negative, doubt pancreatitis. Presentation is not consistent with acute appendicitis, bowel obstruction given that she is having diarrhea. Presentation is also not consistent with diverticulitis. WBC 3.2, likely due to current influenza illness. Electrolytes within normal limits UA without evidence of UTI Given fluid resuscitation here in  the emergency department, pain control with morphine and Bentyl.  Given Zofran with relief of nausea. Likely gastroenteritis versus sequela of influenza.  Her exam does not necessitate imaging at this time. We will discharge her with Zofran and Bentyl for any ongoing symptoms.  I have encouraged her to push lots of fluids.  Eat a bland diet.  She is instructed return with new and sustained fever, increasing abdominal pain or inability to tolerate p.o. Final Clinical Impression(s) / ED Diagnoses Final diagnoses:  Nausea vomiting and diarrhea    Rx / DC Orders ED Discharge Orders          Ordered    dicyclomine (BENTYL) 20 MG tablet  2 times daily        11/19/21 0015    ondansetron (ZOFRAN ODT) 4 MG disintegrating tablet  Every 8 hours PRN        11/19/21 0015             Mickie Hillier, PA-C 11/19/21 0017    Lennice Sites, DO 11/19/21 1545

## 2021-11-18 NOTE — ED Triage Notes (Signed)
Per EMS pt dx with flu yesterday. Pt reports having n/v abd cramps and "unable to keep anything down". Pt was seen and given rx for Tamiflu but pt has not filled. Pt walked in with EMS.

## 2021-11-19 MED ORDER — ONDANSETRON 4 MG PO TBDP: 4.0000 mg | ORAL_TABLET | Freq: Three times a day (TID) | ORAL | 0 refills | Status: DC | PRN

## 2021-11-19 MED ORDER — DICYCLOMINE HCL 20 MG PO TABS: 20.0000 mg | ORAL_TABLET | Freq: Two times a day (BID) | ORAL | 0 refills | Status: DC

## 2021-11-19 NOTE — Discharge Instructions (Signed)
You are seen the emergency department today for abdominal pain, nausea, vomiting and diarrhea.  This is likely due to your flu as viruses can cause irritation to your bowel.  While you were here we gave you fluids, pain medication, medication to control the spasming in your bowel and nausea medication.  I am sending you home with nausea medication and medication called Bentyl which is for spasming in your colon.  Please use this as needed over the next few days.  Please continue to push lots of fluids to prevent dehydration.  Eat a bland diet.  Please return to emergency department if you begin to have fever with increasing abdominal pain or inability to tolerate p.o.

## 2022-02-04 ENCOUNTER — Other Ambulatory Visit: Payer: Self-pay

## 2022-02-04 ENCOUNTER — Encounter (HOSPITAL_BASED_OUTPATIENT_CLINIC_OR_DEPARTMENT_OTHER): Payer: Self-pay | Admitting: Emergency Medicine

## 2022-02-04 ENCOUNTER — Emergency Department (HOSPITAL_BASED_OUTPATIENT_CLINIC_OR_DEPARTMENT_OTHER)
Admission: EM | Admit: 2022-02-04 | Discharge: 2022-02-04 | Disposition: A | Discharge status: Home / Self Care | Payer: BC Managed Care – PPO | Attending: Emergency Medicine | Admitting: Emergency Medicine

## 2022-02-04 DIAGNOSIS — R112 Nausea with vomiting, unspecified: Secondary | ICD-10-CM | POA: Diagnosis not present

## 2022-02-04 HISTORY — DX: Unspecified asthma, uncomplicated: J45.909

## 2022-02-04 LAB — URINALYSIS, ROUTINE W REFLEX MICROSCOPIC
Bilirubin Urine: NEGATIVE
Glucose, UA: NEGATIVE mg/dL
Hgb urine dipstick: NEGATIVE
Ketones, ur: NEGATIVE mg/dL
Leukocytes,Ua: NEGATIVE
Nitrite: NEGATIVE
Protein, ur: NEGATIVE mg/dL
Specific Gravity, Urine: 1.02 (ref 1.005–1.030)
pH: 8.5 — ABNORMAL HIGH (ref 5.0–8.0)

## 2022-02-04 LAB — COMPREHENSIVE METABOLIC PANEL
ALT: 11 U/L (ref 0–44)
AST: 16 U/L (ref 15–41)
Albumin: 3.9 g/dL (ref 3.5–5.0)
Alkaline Phosphatase: 47 U/L (ref 38–126)
Anion gap: 7 (ref 5–15)
BUN: 10 mg/dL (ref 6–20)
CO2: 24 mmol/L (ref 22–32)
Calcium: 8.8 mg/dL — ABNORMAL LOW (ref 8.9–10.3)
Chloride: 103 mmol/L (ref 98–111)
Creatinine, Ser: 0.66 mg/dL (ref 0.44–1.00)
GFR, Estimated: >60 mL/min (ref >60)
Glucose, Bld: 106 mg/dL — ABNORMAL HIGH (ref 70–99)
Potassium: 3.9 mmol/L (ref 3.5–5.1)
Sodium: 134 mmol/L — ABNORMAL LOW (ref 135–145)
Total Bilirubin: 0.5 mg/dL (ref 0.3–1.2)
Total Protein: 7.5 g/dL (ref 6.5–8.1)

## 2022-02-04 LAB — CBC
HCT: 35.6 % — ABNORMAL LOW (ref 36.0–46.0)
Hemoglobin: 11.9 g/dL — ABNORMAL LOW (ref 12.0–15.0)
MCH: 28.1 pg (ref 26.0–34.0)
MCHC: 33.4 g/dL (ref 30.0–36.0)
MCV: 84.2 fL (ref 80.0–100.0)
Platelets: 207 10*3/uL (ref 150–400)
RBC: 4.23 MIL/uL (ref 3.87–5.11)
RDW: 14 % (ref 11.5–15.5)
WBC: 5.6 10*3/uL (ref 4.0–10.5)
nRBC: 0 % (ref 0.0–0.2)

## 2022-02-04 LAB — PREGNANCY, URINE: Preg Test, Ur: NEGATIVE

## 2022-02-04 LAB — LIPASE, BLOOD: Lipase: 27 U/L (ref 11–51)

## 2022-02-04 MED ORDER — ONDANSETRON HCL 4 MG/2ML IJ SOLN
4.0000 mg | Freq: Once | INTRAMUSCULAR | Status: AC
  Administered 2022-02-04: 4 mg via INTRAVENOUS
  Filled 2022-02-04: qty 2

## 2022-02-04 MED ORDER — ALBUTEROL SULFATE HFA 108 (90 BASE) MCG/ACT IN AERS
2.0000 | INHALATION_SPRAY | Freq: Once | RESPIRATORY_TRACT | Status: AC
  Administered 2022-02-04: 2 via RESPIRATORY_TRACT
  Filled 2022-02-04: qty 6.7

## 2022-02-04 MED ORDER — SODIUM CHLORIDE 0.9 % IV BOLUS
1000.0000 mL | Freq: Once | INTRAVENOUS | Status: AC
  Administered 2022-02-04: 1000 mL via INTRAVENOUS

## 2022-02-04 MED ORDER — ONDANSETRON 4 MG PO TBDP: ORAL_TABLET | ORAL | 0 refills | Status: DC

## 2022-02-04 NOTE — ED Provider Notes (Addendum)
[place] EMERGENCY DEPARTMENT Provider Note   CSN: [id number] Arrival date & time: 02/04/22  [id number]     History  Chief Complaint  Patient presents with   Flank Pain   Emesis    Kristen Haney is a 31 y.o. female.  31 yo F with a chief complaints of nausea and vomiting.  This is been going on since about 2 AM.  Woke her up from sleep.  After vomiting throughout the evening she developed some side pain.  She is concerned that maybe she has a kidney infection.  Says this is happened to her in the past.  She has been feeling subjectively hot and cold as well.  No known sick contacts.   Flank Pain  Emesis     Home Medications Prior to Admission medications   Medication Sig Start Date End Date Taking? Authorizing Provider  ondansetron (ZOFRAN-ODT) 4 MG disintegrating tablet 4mg  ODT q4 hours prn nausea/vomit 02/04/22  Yes Deno Etienne, DO  albuterol (VENTOLIN HFA) 108 (90 Base) MCG/ACT inhaler Inhale 1-2 puffs into the lungs every 6 (six) hours as needed for wheezing or shortness of breath. 08/06/21   Loni Beckwith, PA-C  dicyclomine (BENTYL) 20 MG tablet Take 1 tablet (20 mg total) by mouth 2 (two) times daily. 11/19/21   Mickie Hillier, PA-C  escitalopram (LEXAPRO) 5 MG tablet Take 5 mg by mouth daily. Patient not taking: Reported on 08/06/2021    [provider]  ibuprofen (ADVIL) 200 MG tablet Take 400 mg by mouth every 6 (six) hours as needed for headache or mild pain.    [provider]  lidocaine (LIDODERM) 5 % Place 1 patch onto the skin daily. Remove & Discard patch within 12 hours or as directed by MD Patient not taking: Reported on 08/06/2021 04/09/21   Nuala Alpha A, PA-C  naproxen (NAPROSYN) 500 MG tablet Take 1 tablet (500 mg total) by mouth 2 (two) times daily. Patient not taking: Reported on 08/06/2021 04/06/21   Fredia Sorrow, MD      Allergies    Patient has no known allergies.    Review of Systems   Review of Systems   Gastrointestinal:  Positive for vomiting.  Genitourinary:  Positive for flank pain.   Physical Exam Updated Vital Signs BP 121/78    Pulse 98    Temp 99 F (37.2 C)    Resp 16    SpO2 100%  Physical Exam Vitals and nursing note reviewed.  Constitutional:      General: She is not in acute distress.    Appearance: She is well-developed. She is not diaphoretic.     Comments: Smells of marijuana  HENT:     Head: Normocephalic and atraumatic.  Eyes:     Pupils: Pupils are equal, round, and reactive to light.  Cardiovascular:     Rate and Rhythm: Normal rate and regular rhythm.     Heart sounds: No murmur heard.   No friction rub. No gallop.  Pulmonary:     Effort: Pulmonary effort is normal.     Breath sounds: No wheezing or rales.  Abdominal:     General: There is no distension.     Palpations: Abdomen is soft.     Tenderness: There is no abdominal tenderness.  Musculoskeletal:        General: No tenderness.     Cervical back: Normal range of motion and neck supple.  Skin:    General: Skin is warm and dry.  Neurological:     Mental Status: She is alert and oriented to person, place, and time.  Psychiatric:        Behavior: Behavior normal.    ED Results / Procedures / Treatments   Labs (all labs ordered are listed, but only abnormal results are displayed) Labs Reviewed  COMPREHENSIVE METABOLIC PANEL - Abnormal; Notable for the following components:      Result Value   Sodium 134 (*)    Glucose, Bld 106 (*)    Calcium 8.8 (*)    All other components within normal limits  CBC - Abnormal; Notable for the following components:   Hemoglobin 11.9 (*)    HCT 35.6 (*)    All other components within normal limits  URINALYSIS, ROUTINE W REFLEX MICROSCOPIC - Abnormal; Notable for the following components:   pH 8.5 (*)    All other components within normal limits  LIPASE, BLOOD  PREGNANCY, URINE    EKG None  Radiology No results found.  Procedures Procedures     Medications Ordered in ED Medications  ondansetron (ZOFRAN) injection 4 mg (4 mg Intravenous Given 02/04/22 0825)  sodium chloride 0.9 % bolus 1,000 mL (1,000 mLs Intravenous New Bag/Given 02/04/22 0825)    ED Course/ Medical Decision Making/ A&P                           Medical Decision Making Amount and/or Complexity of Data Reviewed Labs: ordered.  Risk Prescription drug management.   Patient is a 31 y.o. female with a cc of nausea and vomiting.  This started this evening about 2 AM.  She is well-appearing and nontoxic.  Has a fairly benign abdominal exam.  I will obtain a laboratory evaluation to assess for pancreatitis or hepatitis.  UA to assess for pyelonephritis.  Give a bolus of IV fluids antiemetics and reassess.  I reviewed the patient's chart and this is not her first visit to the Emergency Department for nausea and vomiting.  She does have multiple visits with a diagnosis of pyelonephritis as well..  I independently interpreted the patients labs and imaging LFTs and lipase are unremarkable.  No significant renal dysfunction no significant anemia.  No leukocytosis.  My independent interpretation of the patient's UA is without infection.    Patient reassessed and feeling mildly better.  Nausea is improved.  Tolerating p.o.  We will discharge the patient home.  PCP follow-up.  Patient requesting albuterol prescription.  Not sure she will be able to afford this we will give her 2 puffs here and let her take at home.   9:03 AM:  I have discussed the diagnosis/risks/treatment options with the patient and family.  Evaluation and diagnostic testing in the emergency department does not suggest an emergent condition requiring admission or immediate intervention beyond what has been performed at this time.  They will follow up with  PCP. We also discussed returning to the ED immediately if new or worsening sx occur. We discussed the sx which are most concerning (e.g., sudden worsening  pain, fever, inability to tolerate by mouth) that necessitate immediate return. Medications administered to the patient during their visit and any new prescriptions provided to the patient are listed below.  Medications given during this visit Medications  ondansetron (ZOFRAN) injection 4 mg (4 mg Intravenous Given 02/04/22 0825)  sodium chloride 0.9 % bolus 1,000 mL (1,000 mLs Intravenous New Bag/Given 02/04/22 0825)     The patient appears reasonably  screen and/or stabilized for discharge and I doubt any other medical condition or other [facility] requiring further screening, evaluation, or treatment in the ED at this time prior to discharge.          Final Clinical Impression(s) / ED Diagnoses Final diagnoses:  Nausea and vomiting in adult    Rx / DC Orders ED Discharge Orders          Ordered    ondansetron (ZOFRAN-ODT) 4 MG disintegrating tablet        02/04/22 0901              Deno Etienne, DO 02/04/22 [id number]

## 2022-02-04 NOTE — Discharge Instructions (Signed)
Most commonly of viral illness will make you have nausea and vomiting.  Usually this is self-limited and gets better on its own.  I have prescribed you nausea medicine to help you with this at home.  He can take Imodium if you develop diarrhea.  Please return for worsening abdominal discomfort inability eat or drink.

## 2022-02-04 NOTE — ED Notes (Signed)
ED Provider at bedside. 

## 2022-02-04 NOTE — ED Triage Notes (Addendum)
Pt reports she woke up at 0200 with HA and chills. Threw up 2x this am. Also started having R flank pain and darker urine Pt later added she felt that her asthma was flaring up

## 2022-03-19 ENCOUNTER — Encounter (HOSPITAL_BASED_OUTPATIENT_CLINIC_OR_DEPARTMENT_OTHER): Payer: Self-pay | Admitting: *Deleted

## 2022-03-19 ENCOUNTER — Other Ambulatory Visit: Payer: Self-pay

## 2022-03-19 ENCOUNTER — Emergency Department (HOSPITAL_BASED_OUTPATIENT_CLINIC_OR_DEPARTMENT_OTHER): Payer: BC Managed Care – PPO

## 2022-03-19 ENCOUNTER — Emergency Department (HOSPITAL_BASED_OUTPATIENT_CLINIC_OR_DEPARTMENT_OTHER)
Admission: EM | Admit: 2022-03-19 | Discharge: 2022-03-19 | Disposition: A | Discharge status: Home / Self Care | Payer: BC Managed Care – PPO | Attending: Emergency Medicine | Admitting: Emergency Medicine

## 2022-03-19 DIAGNOSIS — M7989 Other specified soft tissue disorders: Secondary | ICD-10-CM | POA: Diagnosis not present

## 2022-03-19 DIAGNOSIS — M25562 Pain in left knee: Secondary | ICD-10-CM | POA: Diagnosis not present

## 2022-03-19 DIAGNOSIS — M79662 Pain in left lower leg: Secondary | ICD-10-CM | POA: Diagnosis not present

## 2022-03-19 NOTE — ED Provider Notes (Signed)
?[facility] EMERGENCY DEPARTMENT ?Provider Note ? ? ?CSN: [id number] ?Arrival date & time: 03/19/22  1735 ? ?  ? ?History ? ?Chief Complaint  ?Patient presents with  ? Knee Pain  ? ? ?Kristen Haney is a 31 y.o. female. ? ?The history is provided by the patient.  ?Knee Pain ?Location:  Knee ?Time since incident:  2 days ?Knee location:  L knee ?Pain details:  ?  Quality:  Aching ?  Radiates to:  Does not radiate ?  Severity:  Mild ?  Onset quality:  Gradual ?  Timing:  Constant ?  Progression:  Unchanged ?Chronicity:  New ?Prior injury to area:  No ?Relieved by:  Nothing ?Worsened by:  Bearing weight ?Associated symptoms: swelling   ?Associated symptoms: no back pain, no decreased ROM, no fatigue, no fever, no itching, no muscle weakness, no neck pain, no numbness, no stiffness and no tingling   ? ?  ? ?Home Medications ?Prior to Admission medications   ?Medication Sig Start Date End Date Taking? Authorizing Provider  ?albuterol (VENTOLIN HFA) 108 (90 Base) MCG/ACT inhaler Inhale 1-2 puffs into the lungs every 6 (six) hours as needed for wheezing or shortness of breath. 08/06/21   Haskel Schroeder, PA-C  ?dicyclomine (BENTYL) 20 MG tablet Take 1 tablet (20 mg total) by mouth 2 (two) times daily. 11/19/21   Cristopher Peru, PA-C  ?escitalopram (LEXAPRO) 5 MG tablet Take 5 mg by mouth daily. ?Patient not taking: Reported on 08/06/2021    [provider]  ?ibuprofen (ADVIL) 200 MG tablet Take 400 mg by mouth every 6 (six) hours as needed for headache or mild pain.    [provider]  ?lidocaine (LIDODERM) 5 % Place 1 patch onto the skin daily. Remove & Discard patch within 12 hours or as directed by MD ?Patient not taking: Reported on 08/06/2021 04/09/21   Harlene Salts A, PA-C  ?naproxen (NAPROSYN) 500 MG tablet Take 1 tablet (500 mg total) by mouth 2 (two) times daily. ?Patient not taking: Reported on 08/06/2021 04/06/21   Vanetta Mulders, MD  ?ondansetron (ZOFRAN-ODT) 4 MG disintegrating  tablet 4mg  ODT q4 hours prn nausea/vomit 02/04/22   04/04/22, DO  ?   ? ?Allergies    ?Patient has no known allergies.   ? ?Review of Systems   ?Review of Systems  ?Constitutional:  Negative for fatigue and fever.  ?Musculoskeletal:  Negative for back pain, neck pain and stiffness.  ?Skin:  Negative for itching.  ? ?Physical Exam ?Updated Vital Signs ?BP 112/76 (BP Location: Right Arm)   Pulse 91   Temp 99.2 ?F (37.3 ?C) (Oral)   Resp 18   Ht 5\' 8"  (1.727 m)   Wt 87.5 kg   LMP 03/12/2022   SpO2 99%   BMI 29.35 kg/m?  ?Physical Exam ?Constitutional:   ?   General: She is not in acute distress. ?   Appearance: She is not ill-appearing.  ?Cardiovascular:  ?   Pulses: Normal pulses.  ?Musculoskeletal:     ?   General: Tenderness present. No swelling or deformity. Normal range of motion.  ?   Cervical back: Normal range of motion. Tenderness present.  ?   Comments: Tenderness to the suprapatellar portion of the medial left knee as well as popliteal fossa with no major swelling, good range of motion, no erythema or warmth of the left knee  ?Skin: ?   General: Skin is warm.  ?   Findings: No erythema.  ?Neurological:  ?  General: No focal deficit present.  ?   Mental Status: She is alert.  ?   Sensory: No sensory deficit.  ?   Motor: No weakness.  ? ? ?ED Results / Procedures / Treatments   ?Labs ?(all labs ordered are listed, but only abnormal results are displayed) ?Labs Reviewed - No data to display ? ?EKG ?None ? ?Radiology ?US Venous Img Lower  Left (DVT Study) ? ?Result Date: 03/19/2022 ?CLINICAL DATA:  Pain EXAM: LEFT LOWER EXTREMITY VENOUS DOPPLER ULTRASOUND TECHNIQUE: Gray-scale sonography with compression, as well as color and duplex ultrasound, were performed to evaluate the deep venous system(s) from the level of the common femoral vein through the popliteal and proximal calf veins. COMPARISON:  None. FINDINGS: VENOUS Normal compressibility of the common femoral, superficial femoral, and popliteal  veins, as well as the visualized calf veins. Visualized portions of profunda femoral vein and great saphenous vein unremarkable. No filling defects to suggest DVT on grayscale or color Doppler imaging. Doppler waveforms show normal direction of venous flow, normal respiratory plasticity and response to augmentation. Limited views of the contralateral common femoral vein are unremarkable. OTHER None. Limitations: none IMPRESSION: Negative. Electronically Signed   By: Emmaline Kluver M.D.   On: 03/19/2022 19:36  ? ?DG Knee Complete 4 Views Left ? ?Result Date: 03/19/2022 ?CLINICAL DATA:  Left knee pain and swelling for 5 days. EXAM: LEFT KNEE - COMPLETE 4+ VIEW COMPARISON:  None. FINDINGS: No evidence of fracture, dislocation, or joint effusion. No evidence of arthropathy or other focal bone abnormality. Soft tissues are unremarkable. IMPRESSION: Negative. Electronically Signed   By: Emmaline Kluver M.D.   On: 03/19/2022 18:18   ? ?Procedures ?Procedures  ? ? ?Medications Ordered in ED ?Medications - No data to display ? ?ED Course/ Medical Decision Making/ A&P ?  ?                        ?Medical Decision Making ?Amount and/or Complexity of Data Reviewed ?Radiology: ordered. ? ? ?Kristen Haney is here with left knee pain.  No major swelling or erythema on exam.  Good range of motion but tender to the left lateral portion of the left knee and left popliteal fossa.  Normal strength and sensation good pulses in her lower extremities bilaterally.  Differential diagnosis includes bursitis versus DVT versus fracture or sprain.  No concern for septic joint.  Will obtain x-ray and DVT study. ? ?Per my review and interpretation of x-ray there is no evidence of fracture or dislocation.  No major effusion.  DVT study per my review is negative.  Overall suspect inflammatory process.  Recommend ice, Tylenol, ibuprofen, compression.  We will have her follow-up with sports medicine.  Discharged in good condition. ? ?This chart  was dictated using voice recognition software.  Despite best efforts to proofread,  errors can occur which can change the documentation meaning.  ? ? ? ? ? ? ? ?Final Clinical Impression(s) / ED Diagnoses ?Final diagnoses:  ?Acute pain of left knee  ? ? ?Rx / DC Orders ?ED Discharge Orders   ? ? None  ? ?  ? ? ?  ?Virgina Norfolk, DO ?03/19/22 2013 ? ?

## 2022-03-19 NOTE — Discharge Instructions (Signed)
Recommend Tylenol and ibuprofen and ice and compression for your left knee pain.  Follow-up with sports medicine.  Overall suspect that this is inflammation from overuse. ?

## 2022-03-19 NOTE — ED Triage Notes (Signed)
Left knee pain and swelling for 5 days.  ?

## 2022-03-19 NOTE — ED Notes (Signed)
Patient discharged to home.  All discharge instructions reviewed.  Patient verbalized understanding via teachback method.  VS WDL.  Respirations even and unlabored.  Ambulatory out of ED.   °

## 2022-03-27 ENCOUNTER — Ambulatory Visit: Payer: BC Managed Care – PPO | Admitting: Family Medicine

## 2022-03-29 ENCOUNTER — Ambulatory Visit: Payer: BC Managed Care – PPO | Admitting: Family Medicine

## 2022-04-07 IMAGING — CR DG CHEST 1V PORT
1 series · 1 of 1 positions shown · non-contrast
Comparison: 07/18/2021 prior radiographs

CLINICAL DATA: Shortness of breath for 1 week.

EXAM:
PORTABLE CHEST 1 VIEW

[w chest pa]
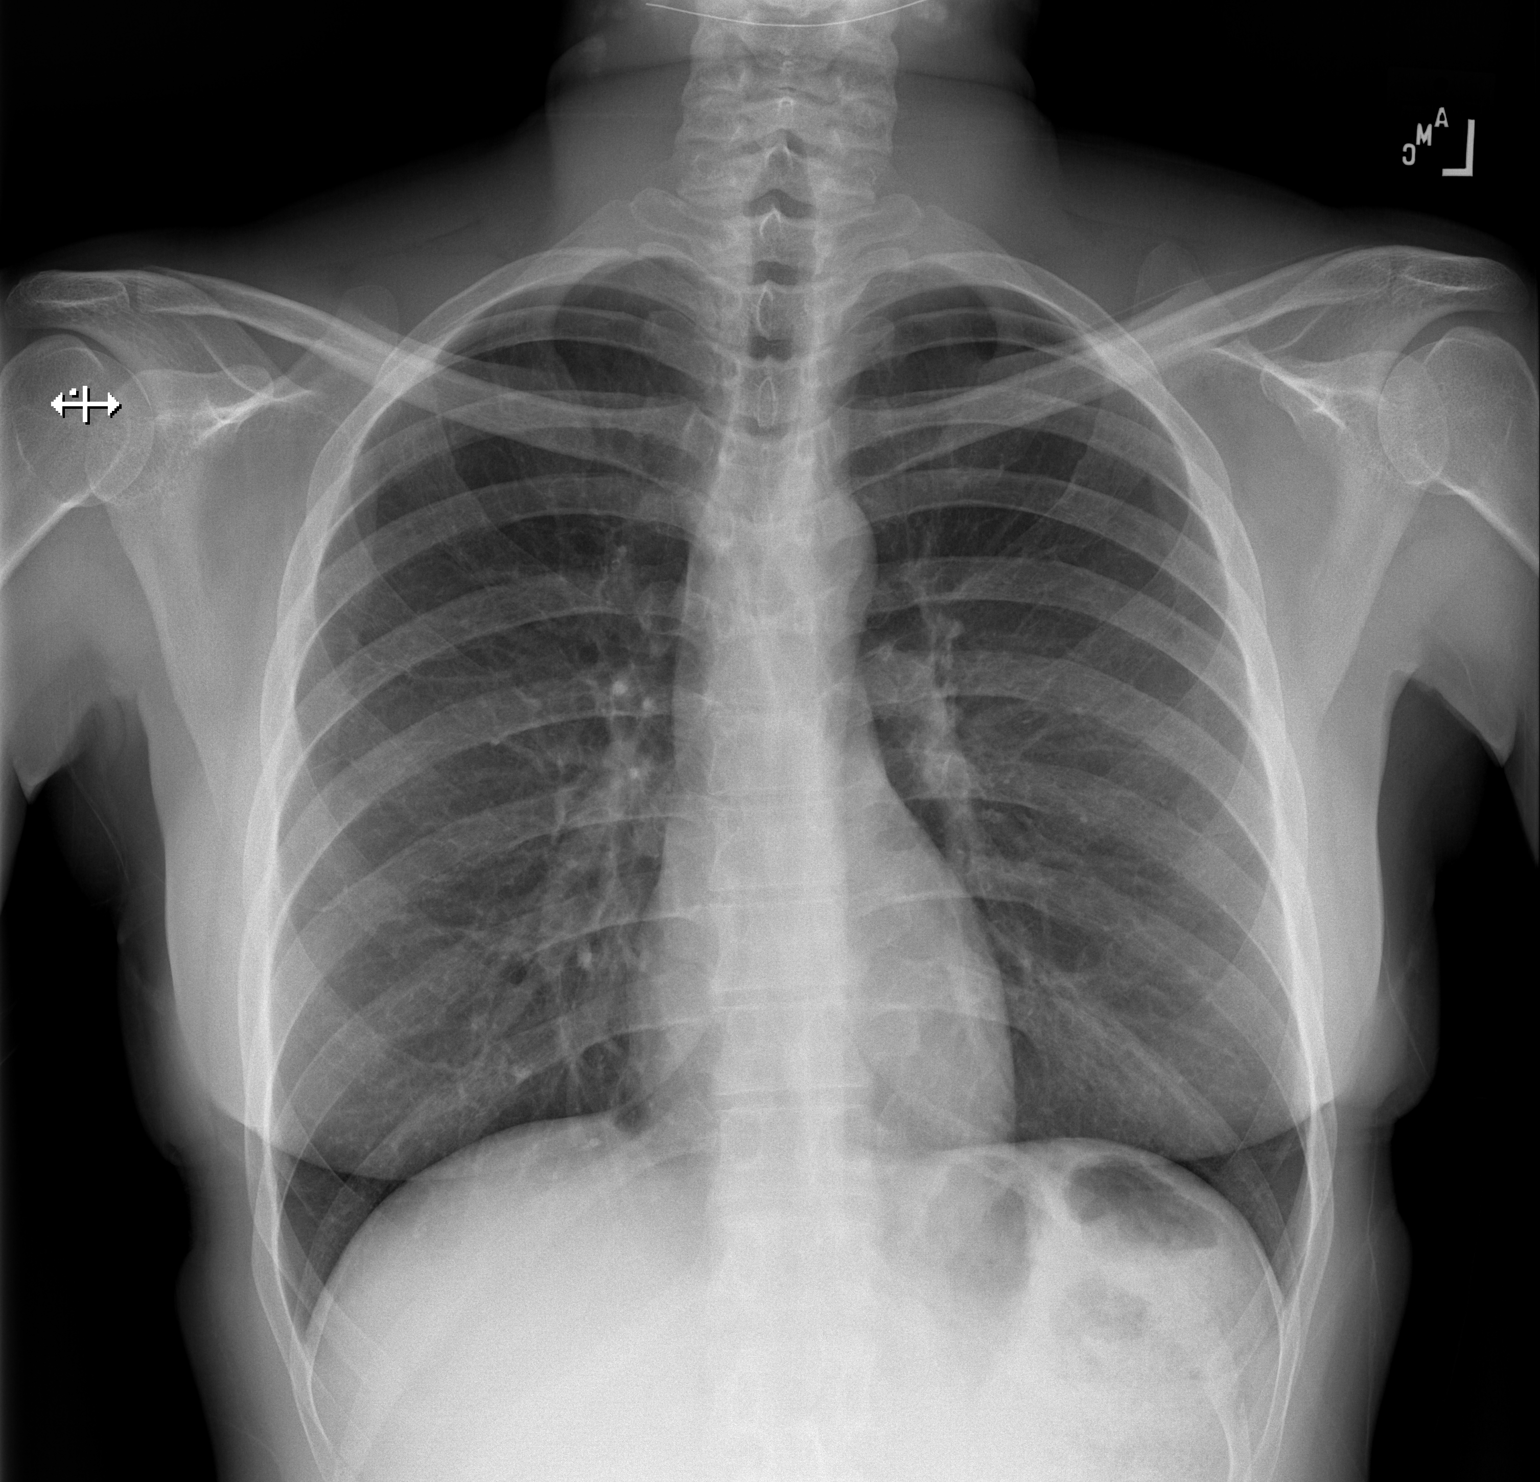

[1 of 1 positions shown; findings below may reference images not displayed]

FINDINGS: The cardiomediastinal silhouette is unremarkable.

There is no evidence of focal airspace disease, pulmonary edema,
suspicious pulmonary nodule/mass, pleural effusion, or pneumothorax.

No acute bony abnormalities are identified.
IMPRESSION: No active disease.

## 2022-10-15 ENCOUNTER — Ambulatory Visit (INDEPENDENT_AMBULATORY_CARE_PROVIDER_SITE_OTHER): Payer: BC Managed Care – PPO | Admitting: Family Medicine

## 2022-10-15 ENCOUNTER — Encounter: Payer: Self-pay | Admitting: Family Medicine

## 2022-10-15 VITALS — BP 100/70 | HR 90 | Temp 99.5°F | Ht 67.75 in | Wt 222.5 lb

## 2022-10-15 DIAGNOSIS — G8929 Other chronic pain: Secondary | ICD-10-CM

## 2022-10-15 DIAGNOSIS — M542 Cervicalgia: Secondary | ICD-10-CM

## 2022-10-15 DIAGNOSIS — L732 Hidradenitis suppurativa: Secondary | ICD-10-CM | POA: Diagnosis not present

## 2022-10-15 DIAGNOSIS — L309 Dermatitis, unspecified: Secondary | ICD-10-CM | POA: Insufficient documentation

## 2022-10-15 DIAGNOSIS — J029 Acute pharyngitis, unspecified: Secondary | ICD-10-CM | POA: Diagnosis not present

## 2022-10-15 LAB — POCT RAPID STREP A (OFFICE): Rapid Strep A Screen: POSITIVE — AB

## 2022-10-15 MED ORDER — CYCLOBENZAPRINE HCL 10 MG PO TABS: 5.0000 mg | ORAL_TABLET | Freq: Three times a day (TID) | ORAL | 0 refills | Status: DC | PRN

## 2022-10-15 MED ORDER — IBUPROFEN 600 MG PO TABS: 600.0000 mg | ORAL_TABLET | Freq: Three times a day (TID) | ORAL | 0 refills | Status: DC | PRN

## 2022-10-15 MED ORDER — NYSTATIN-TRIAMCINOLONE 100000-0.1 UNIT/GM-% EX OINT: 1.0000 | TOPICAL_OINTMENT | Freq: Two times a day (BID) | CUTANEOUS | 2 refills | Status: DC

## 2022-10-15 MED ORDER — DOXYCYCLINE HYCLATE 100 MG PO TABS: 100.0000 mg | ORAL_TABLET | Freq: Two times a day (BID) | ORAL | 0 refills | Status: AC

## 2022-10-15 NOTE — Progress Notes (Signed)
New Patient Office Visit  Subjective    Patient ID: Kristen Haney, female    DOB: [birth year]  Age: 31 y.o. MRN: [id number]  CC:  Chief Complaint  Patient presents with  . Establish Care    HPI Kristen Haney presents to establish care Patient is here to establish care, states she was being seen by [facility]. Patient reports that for months itching of her breasts, states that this is every day, reports that she gets occasional crusting and the skin feels very dry, no fever/chills, no rash present. She has tried creams in the past without any improvement.   Patient reports also for several months she has been having neck pain, states that she was given a muscle relaxer in the past which has helped some. 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 States it gets better when she lays down. States that a couple of times it started out as a sore throat and then her whole neck was hurting after that. States that it comes and goes, not constant.   Patient has a psychiatrist and therapist that she sees regularly for her depression, states that they are prescribing her medications and they are working well.   Outpatient Encounter Medications as of 10/15/2022  Medication Sig  . albuterol (VENTOLIN HFA) 108 (90 Base) MCG/ACT inhaler Inhale 1-2 puffs into the lungs every 6 (six) hours as needed for wheezing or shortness of breath.  . escitalopram (LEXAPRO) 10 MG tablet Take 10 mg by mouth daily.  . hydrOXYzine (ATARAX) 50 MG tablet Take 100 mg by mouth at bedtime.  . lamoTRIgine (LAMICTAL) 25 MG tablet Take 50 mg by mouth at bedtime.   No facility-administered encounter medications on file as of 10/15/2022.    Past Medical History:  Diagnosis Date  . Abscesses of both axillae   . Asthma   . Bipolar 1 disorder (HCC)   . Chronic post-thoracotomy pain   . Depression   . Hidradenitis suppurativa   . Hx of migraines   . Hx: UTI (urinary tract infection)   . Medical  history non-contributory     Past Surgical History:  Procedure Laterality Date  . INCISION AND DRAINAGE Bilateral    axillary regions due to HS  . NONE    . TUBAL LIGATION      Family History  Problem Relation Age of Onset  . Hypertension Mother   . Asthma Son   . Cancer Maternal Aunt        PANCREATIC  . Stroke Paternal Grandmother     Social History   Socioeconomic History  . Marital status: Single    Spouse name: Not on file  . Number of children: Not on file  . Years of education: Not on file  . Highest education level: Not on file  Occupational History  . Not on file  Tobacco Use  . Smoking status: Every Day    Packs/day: 0.50    Types: Cigarettes  . Smokeless tobacco: Never  Vaping Use  . Vaping Use: Never used  Substance and Sexual Activity  . Alcohol use: Never  . Drug use: Yes    Types: Marijuana  . Sexual activity: Yes    Birth control/protection: None  Other Topics Concern  . Not on file  Social History Narrative  . Not on file   Social Determinants of Health   Financial Resource Strain: Not on file  Food Insecurity: Not on file  Transportation Needs:  Not on file  Physical Activity: Not on file  Stress: Not on file  Social Connections: Not on file  Intimate Partner Violence: Not on file    Review of Systems  HENT:  Negative for hearing loss.   Eyes:  Negative for blurred vision.  Respiratory:  Negative for shortness of breath.   Cardiovascular:  Negative for chest pain.  Gastrointestinal: Negative.   Genitourinary: Negative.   Musculoskeletal:  Negative for back pain.  Neurological:  Negative for headaches.  Psychiatric/Behavioral:  Negative for depression.         Objective    BP 100/70 (BP Location: Left Arm, Patient Position: Sitting, Cuff Size: Large)   Pulse 90   Temp 99.5 F (37.5 C) (Oral)   Ht 5' 7.75" (1.721 m)   Wt 222 lb 8 oz (100.9 kg)   LMP 09/30/2022 (Exact Date)   SpO2 100%   BMI 34.08 kg/m   Physical  Exam Vitals reviewed.  Constitutional:      Appearance: Normal appearance. She is well-groomed and normal weight.  Eyes:     Conjunctiva/sclera: Conjunctivae normal.  Neck:     Thyroid: No thyromegaly.  Cardiovascular:     Rate and Rhythm: Normal rate and regular rhythm.     Pulses: Normal pulses.     Heart sounds: S1 normal and S2 normal.  Pulmonary:     Effort: Pulmonary effort is normal.     Breath sounds: Normal breath sounds and air entry.  Abdominal:     General: Bowel sounds are normal.  Musculoskeletal:     Right lower leg: No edema.     Left lower leg: No edema.  Neurological:     Mental Status: She is alert and oriented to person, place, and time. Mental status is at baseline.     Gait: Gait is intact.  Psychiatric:        Mood and Affect: Mood and affect normal.        Speech: Speech normal.        Behavior: Behavior normal.        Judgment: Judgment normal.    {Labs (Optional):23779}    Assessment & Plan:   Problem List Items Addressed This Visit       Musculoskeletal and Integument   Dermatitis due to unknown cause   Relevant Medications   nystatin-triamcinolone ointment (MYCOLOG)     Other   Chronic neck pain (Chronic)    Will give muscle relaxers and NSAIDS PRN, orders placed for cyclobenzaprine 10 mg every 8 hours as needed. Ibuprofen 800 mg every 8 hours PRN. Checked patient for strep throat given the sx of sore throat.      Relevant Medications   escitalopram (LEXAPRO) 10 MG tablet   lamoTRIgine (LAMICTAL) 25 MG tablet   ibuprofen (ADVIL) 600 MG tablet   cyclobenzaprine (FLEXERIL) 10 MG tablet   Other Visit Diagnoses     Sore throat    -  Primary   Relevant Orders   POC Rapid Strep A       Return for Pt needs her Annual physical with PAP by November 15.   Farrel Conners, MD

## 2022-10-15 NOTE — Assessment & Plan Note (Signed)
Will give muscle relaxers and NSAIDS PRN, orders placed for cyclobenzaprine 10 mg every 8 hours as needed. Ibuprofen 800 mg every 8 hours PRN. Checked patient for strep throat given the sx of sore throat.

## 2022-10-17 NOTE — Assessment & Plan Note (Addendum)
Skin is raised and flaky, no redness or open wounds, will treat with nystatin- triamcinolone  cream twice a day for 14 days, advised she use daily moisturizers to help with the flaking.

## 2022-11-02 ENCOUNTER — Encounter: Payer: BC Managed Care – PPO | Admitting: Family Medicine

## 2022-11-08 ENCOUNTER — Ambulatory Visit (INDEPENDENT_AMBULATORY_CARE_PROVIDER_SITE_OTHER): Payer: BC Managed Care – PPO | Admitting: Family Medicine

## 2022-11-08 ENCOUNTER — Encounter: Payer: Self-pay | Admitting: Family Medicine

## 2022-11-08 ENCOUNTER — Other Ambulatory Visit (HOSPITAL_COMMUNITY)
Admission: RE | Admit: 2022-11-08 | Discharge: 2022-11-08 | Disposition: A | Discharge status: Home / Self Care | Payer: BC Managed Care – PPO | Source: Ambulatory Visit | Attending: Family Medicine | Admitting: Family Medicine

## 2022-11-08 VITALS — BP 100/60 | HR 75 | Temp 98.4°F | Ht 68.5 in | Wt 225.4 lb

## 2022-11-08 DIAGNOSIS — M542 Cervicalgia: Secondary | ICD-10-CM | POA: Diagnosis not present

## 2022-11-08 DIAGNOSIS — Z23 Encounter for immunization: Secondary | ICD-10-CM | POA: Diagnosis not present

## 2022-11-08 DIAGNOSIS — G8929 Other chronic pain: Secondary | ICD-10-CM

## 2022-11-08 DIAGNOSIS — Z01419 Encounter for gynecological examination (general) (routine) without abnormal findings: Secondary | ICD-10-CM | POA: Diagnosis not present

## 2022-11-08 MED ORDER — IBUPROFEN 600 MG PO TABS: 600.0000 mg | ORAL_TABLET | Freq: Three times a day (TID) | ORAL | 5 refills | Status: DC | PRN

## 2022-11-08 MED ORDER — CYCLOBENZAPRINE HCL 10 MG PO TABS: 5.0000 mg | ORAL_TABLET | Freq: Three times a day (TID) | ORAL | 5 refills | Status: DC | PRN

## 2022-11-08 NOTE — Addendum Note (Signed)
Addended by: Johnella Moloney on: 11/08/2022 11:21 AM   Modules accepted: Orders

## 2022-11-08 NOTE — Assessment & Plan Note (Signed)
Normal physical exam findings today, pap sent.

## 2022-11-08 NOTE — Progress Notes (Signed)
Complete physical exam  Patient: Kristen Haney   DOB: [birth year]   31 y.o. Female  MRN: [id number]  Subjective:    Chief Complaint  Patient presents with   Annual Exam    Kristen Haney is a 31 y.o. female who presents today for a complete physical exam. She reports consuming a general diet.  Wlaking at her job  She generally feels well. She reports sleeping well. She does not have additional problems to discuss today.   Pt reports that her neck pain is better since finishing the abx however she is still having symptoms in the posterior neck, feels like a burning sensation. She needs refills on her advil and flexeril rx's.  Most recent fall risk assessment:    10/31/2017    9:59 AM  Fall Risk   Falls in the past year? No     Most recent depression screenings:    10/15/2022    4:41 PM  PHQ 2/9 Scores  PHQ - 2 Score 4  PHQ- 9 Score 13        Patient Care Team: Karie Georges, MD as PCP - General (Family Medicine)   Outpatient Medications Prior to Visit  Medication Sig   albuterol (VENTOLIN HFA) 108 (90 Base) MCG/ACT inhaler Inhale 1-2 puffs into the lungs every 6 (six) hours as needed for wheezing or shortness of breath.   escitalopram (LEXAPRO) 10 MG tablet Take 10 mg by mouth daily.   hydrOXYzine (ATARAX) 50 MG tablet Take 100 mg by mouth at bedtime.   lamoTRIgine (LAMICTAL) 25 MG tablet Take 50 mg by mouth at bedtime.   [DISCONTINUED] cyclobenzaprine (FLEXERIL) 10 MG tablet Take 0.5-1 tablets (5-10 mg total) by mouth 3 (three) times daily as needed for muscle spasms.   [DISCONTINUED] ibuprofen (ADVIL) 600 MG tablet Take 1 tablet (600 mg total) by mouth every 8 (eight) hours as needed.   No facility-administered medications prior to visit.    Review of Systems  HENT:  Negative for hearing loss.   Eyes:  Negative for blurred vision.  Respiratory:  Negative for shortness of breath.   Cardiovascular:  Negative for chest pain.  Gastrointestinal: Negative.    Genitourinary: Negative.   Musculoskeletal:  Negative for back pain.  Neurological:  Negative for headaches.  Psychiatric/Behavioral:  Negative for depression.           Objective:     BP 100/60 (BP Location: Left Arm, Patient Position: Sitting, Cuff Size: Large)   Pulse 75   Temp 98.4 F (36.9 C) (Oral)   Ht 5' 8.5" (1.74 m)   Wt 225 lb 6.4 oz (102.2 kg)   LMP 10/28/2022 (Exact Date)   SpO2 100%   BMI 33.77 kg/m    Physical Exam Vitals reviewed. Exam conducted with a chaperone present.  Constitutional:      Appearance: She is normal weight.  Pulmonary:     Effort: Pulmonary effort is normal.  Chest:     Chest wall: No mass.  Breasts:    Tanner Score is 5.     Right: Normal. No mass or tenderness.     Left: Normal. No mass or tenderness.  Abdominal:     General: Bowel sounds are normal.     Palpations: Abdomen is soft.  Genitourinary:    General: Normal vulva.     Exam position: Lithotomy position.     Tanner stage (genital): 5.     Vagina: Normal.     Cervix: Normal.  Uterus: Normal.      Adnexa: Right adnexa normal and left adnexa normal.     Rectum: Normal.  Lymphadenopathy:     Upper Body:     Right upper body: No axillary adenopathy.     Left upper body: No axillary adenopathy.  Neurological:     General: No focal deficit present.     Mental Status: She is alert and oriented to person, place, and time.  Psychiatric:        Mood and Affect: Mood and affect normal.      No results found for any visits on 11/08/22.     Assessment & Plan:    Routine Health Maintenance and Physical Exam  Immunization History  Administered Date(s) Administered   Influenza,inj,Quad PF,6+ Mos 11/20/2017   PFIZER(Purple Top)SARS-COV-2 Vaccination 05/31/2020, 08/31/2020    Health Maintenance  Topic Date Due   PAP SMEAR-Modifier  10/15/2021   INFLUENZA VACCINE  07/31/2022   COVID-19 Vaccine (3 - Pfizer series) 11/24/2022 (Originally 10/26/2020)   Hepatitis  C Screening  10/16/2023 (Originally 11/06/2007)   HIV Screening  10/16/2023 (Originally 11/05/2004)   TETANUS/TDAP  02/08/2028   HPV VACCINES  Aged Out    Discussed health benefits of physical activity, and encouraged her to engage in regular exercise appropriate for her age and condition.  Problem List Items Addressed This Visit       Other   Well woman exam with routine gynecological exam - Primary    Normal physical exam findings today, pap sent.      Relevant Orders   Cytology - PAP   Chronic neck pain (Chronic)    Better but still present, I refilled her advil 600 mg every 8 hours PRN and her cyclobenzaprine 10 mg which she is taking daily. I gave her some neck rehab exercises to try at home also.       Relevant Medications   cyclobenzaprine (FLEXERIL) 10 MG tablet   ibuprofen (ADVIL) 600 MG tablet   Return in about 1 year (around 11/09/2023) for Annual Physical Exam.     Karie Georges, MD

## 2022-11-08 NOTE — Assessment & Plan Note (Signed)
Better but still present, I refilled her advil 600 mg every 8 hours PRN and her cyclobenzaprine 10 mg which she is taking daily. I gave her some neck rehab exercises to try at home also.

## 2022-11-09 LAB — CYTOLOGY - PAP
Chlamydia: NEGATIVE
Comment: NEGATIVE
Comment: NEGATIVE
Comment: NEGATIVE
Comment: NORMAL
Diagnosis: NEGATIVE
High risk HPV: POSITIVE — AB
Neisseria Gonorrhea: NEGATIVE
Trichomonas: NEGATIVE

## 2022-11-09 NOTE — Progress Notes (Signed)
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 She will need pap smears every 3 years

## 2022-11-14 ENCOUNTER — Encounter: Payer: Self-pay | Admitting: Family Medicine

## 2022-11-18 IMAGING — DX DG KNEE COMPLETE 4+V*L*
4 series · 4 of 4 positions shown · non-contrast
Comparison: None.

CLINICAL DATA: Left knee pain and swelling for 5 days.

EXAM:
LEFT KNEE - COMPLETE 4+ VIEW

[knee ap]
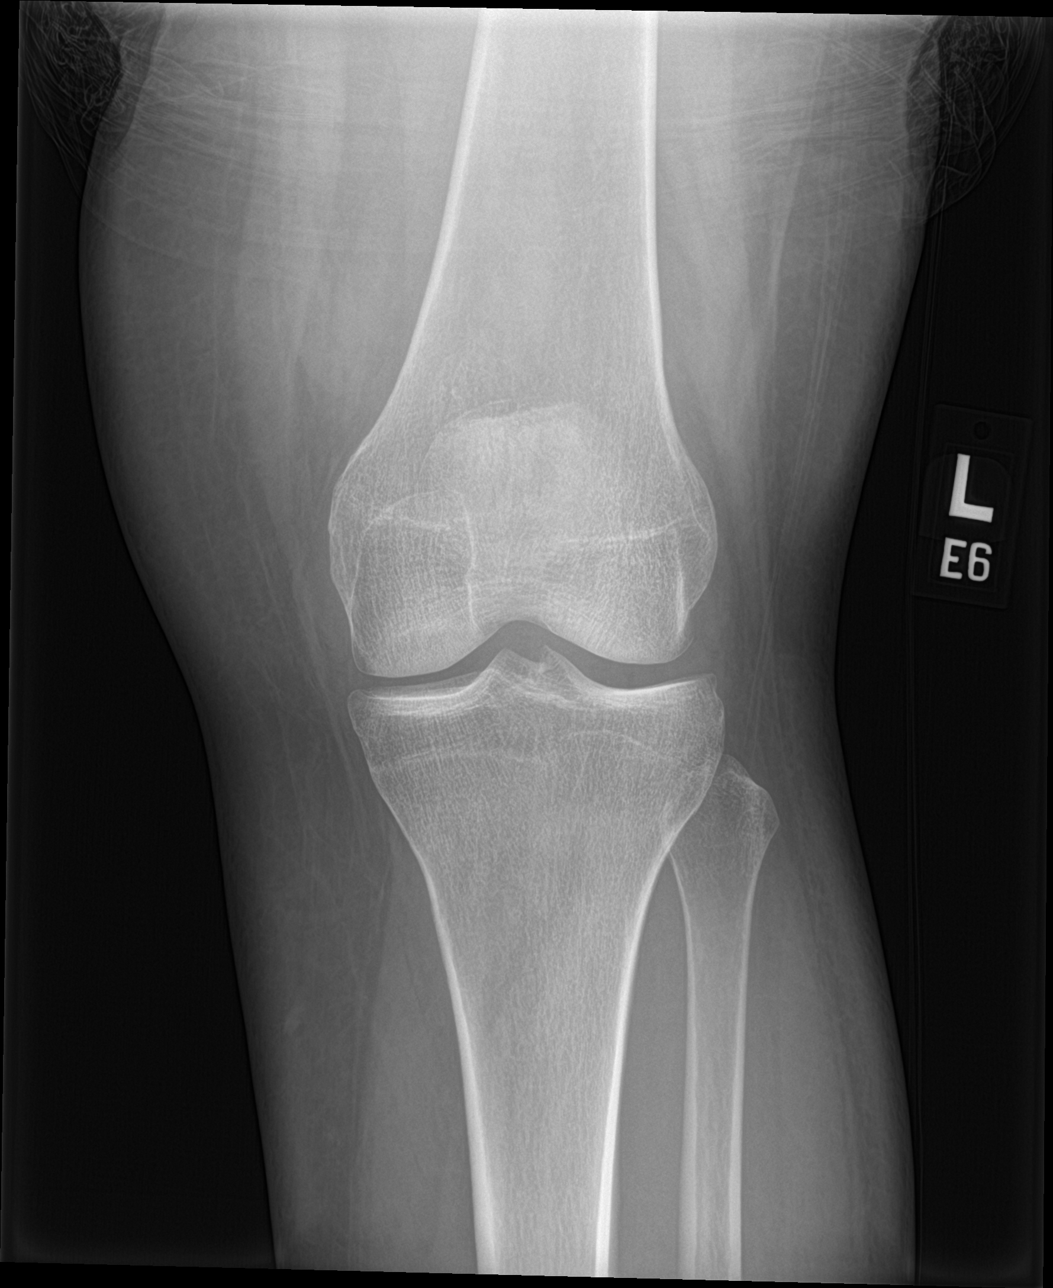

[knee lat]
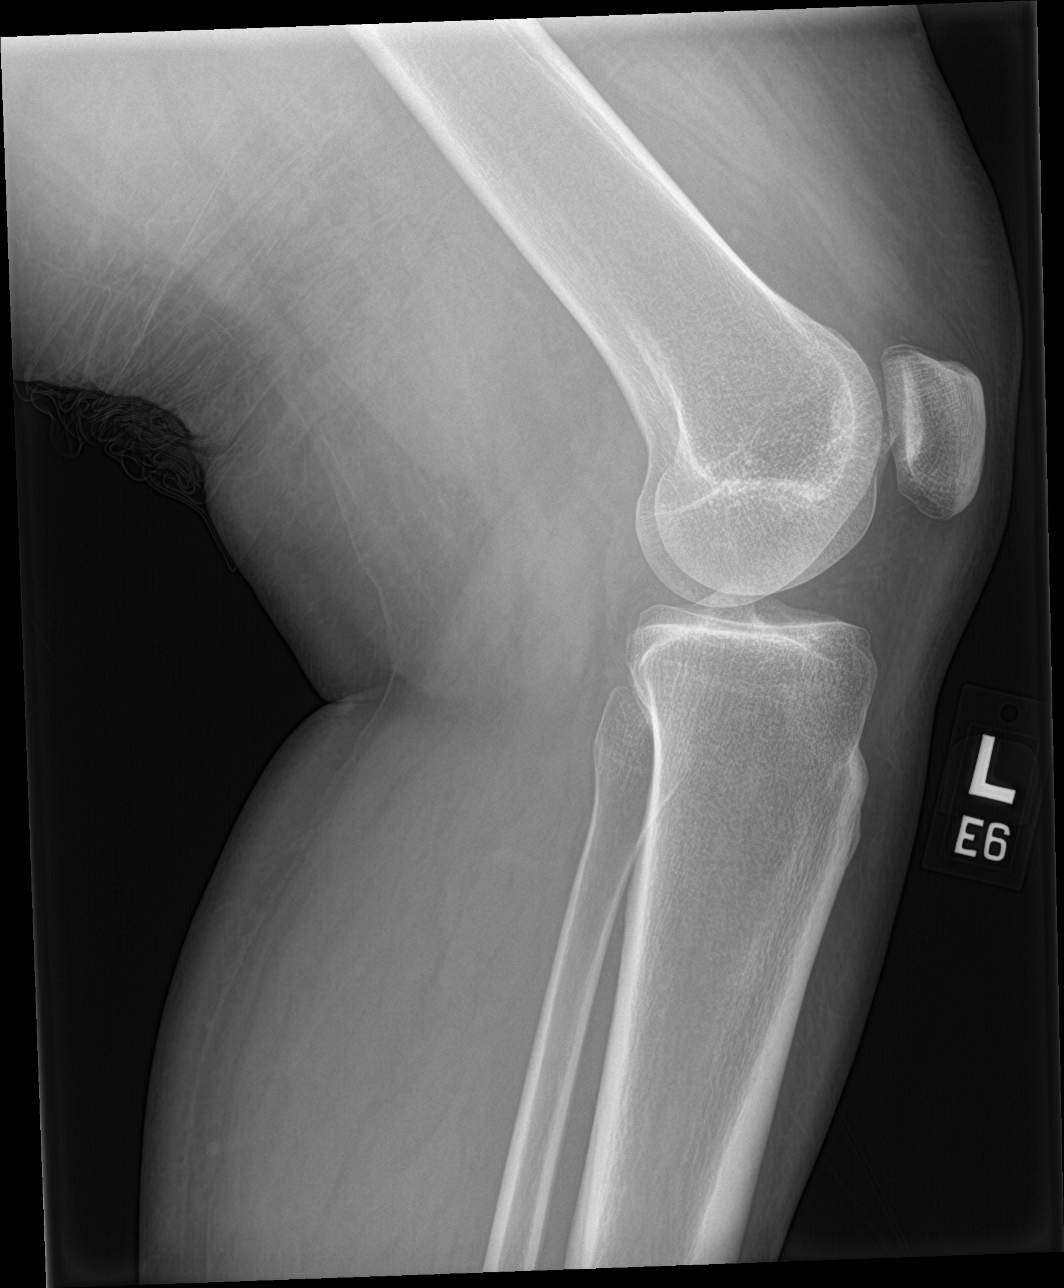

[knee obl (1 of 2)]
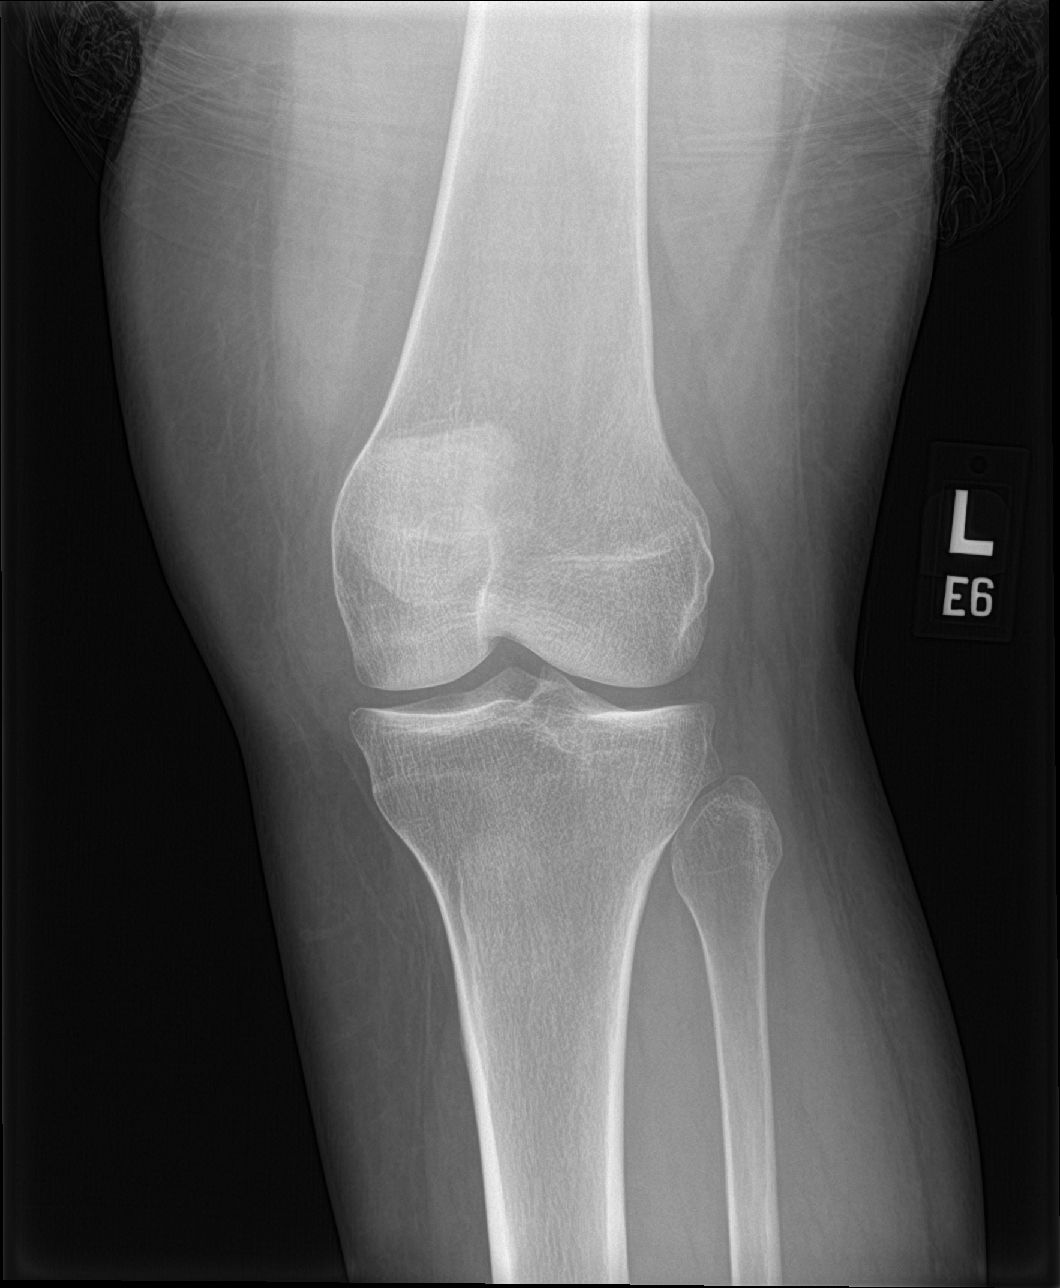

[knee obl (2 of 2)]
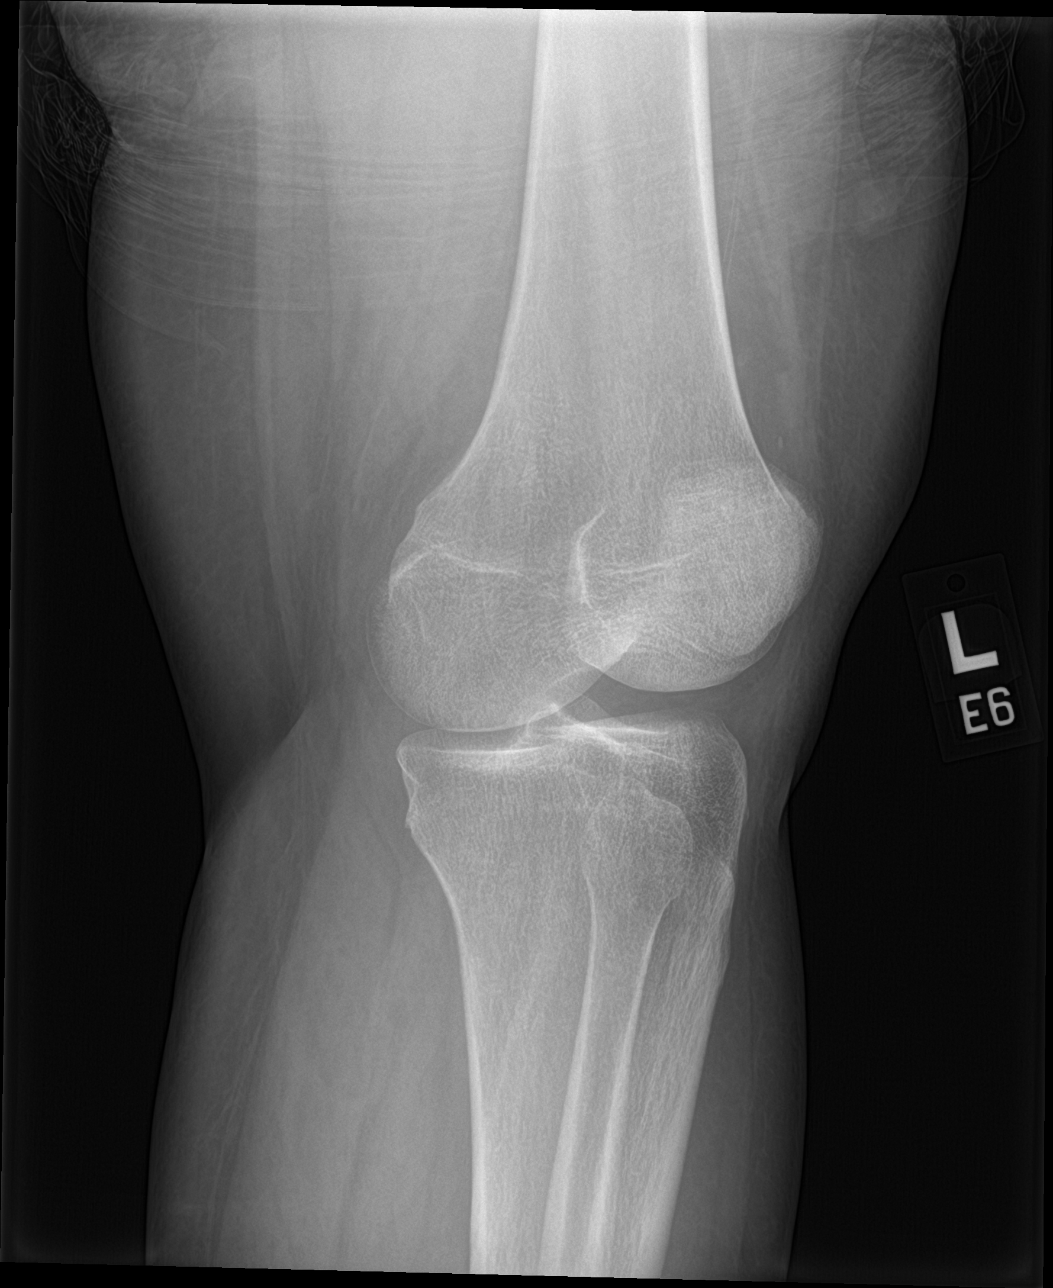

[4 of 4 positions shown; findings below may reference images not displayed]

FINDINGS: No evidence of fracture, dislocation, or joint effusion. No evidence
of arthropathy or other focal bone abnormality. Soft tissues are
unremarkable.
IMPRESSION: Negative.

## 2022-11-18 IMAGING — US US EXTREM LOW VENOUS*L*
1 series · 14 of 24 positions shown · non-contrast
Comparison: None.

CLINICAL DATA: Pain

EXAM:
LEFT LOWER EXTREMITY VENOUS DOPPLER ULTRASOUND
TECHNIQUE: Gray-scale sonography with compression, as well as color and duplex
ultrasound, were performed to evaluate the deep venous system(s)
from the level of the common femoral vein through the popliteal and
proximal calf veins.

[Series 1: us extrem low venous*left* · 14 of 29 slices shown]
[im 1/29]
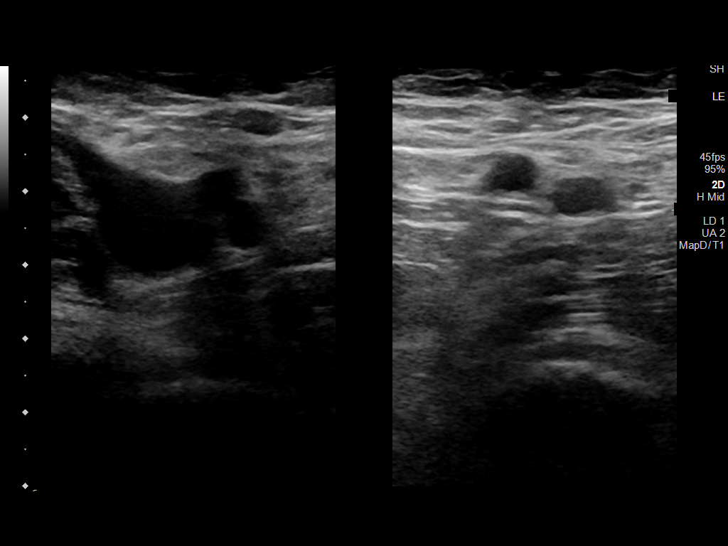
[im 3/29]
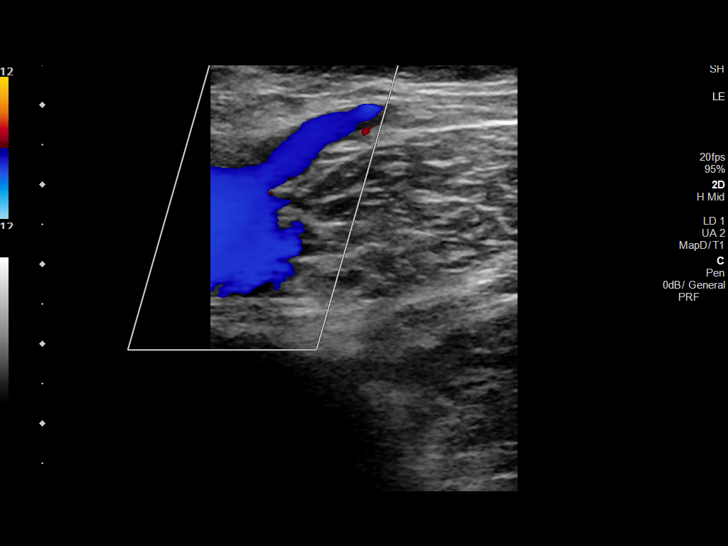
[im 5/29]
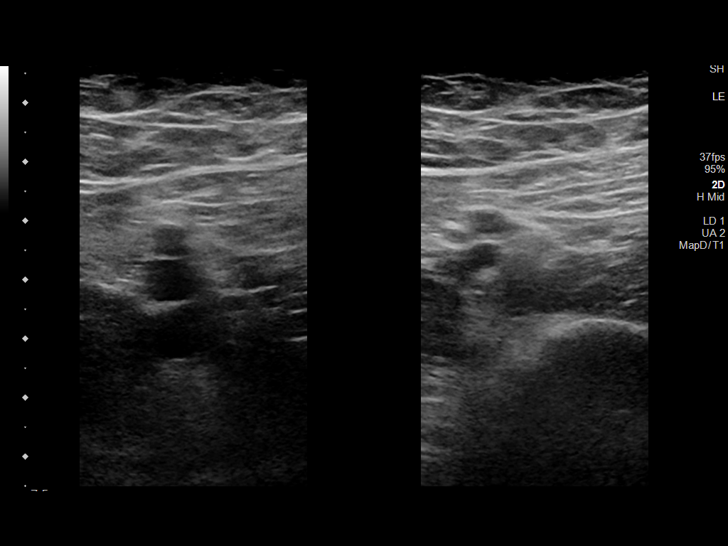
[im 8/29]
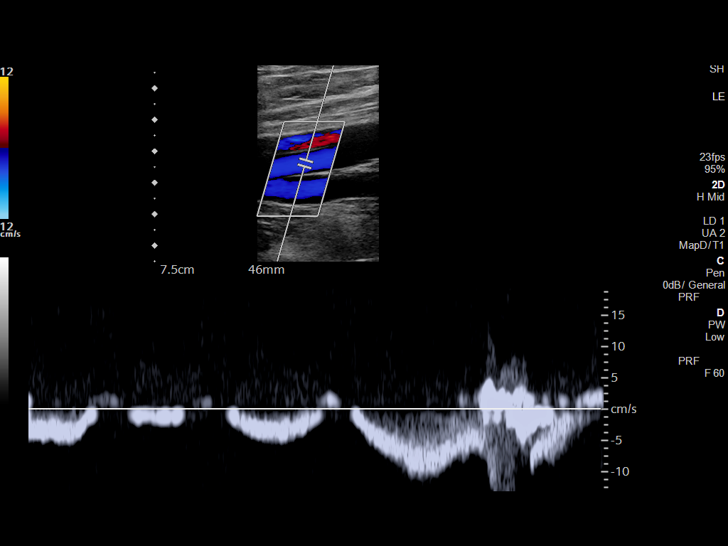
[im 9/29]
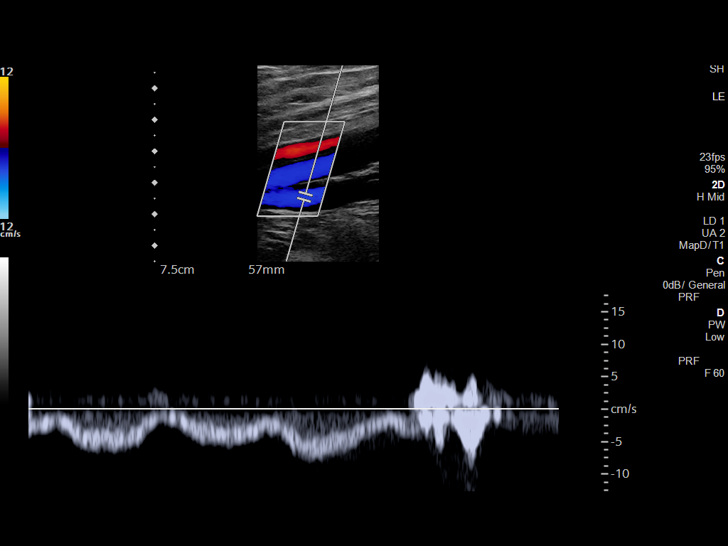
[im 11/29]
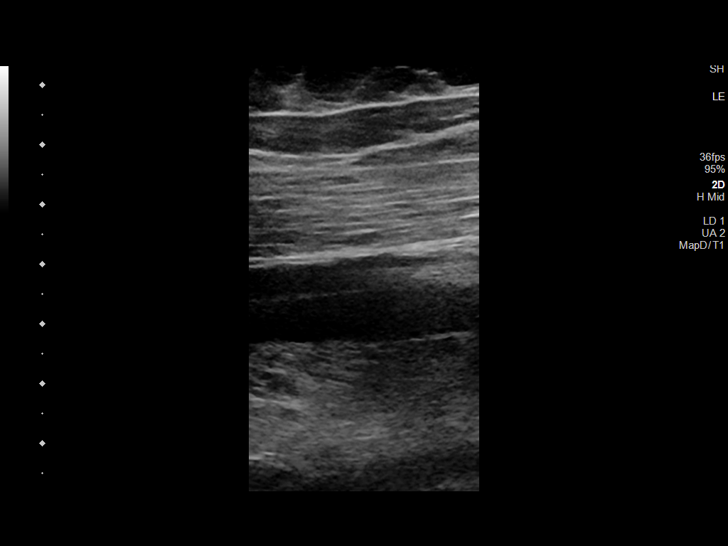
[im 14/29]
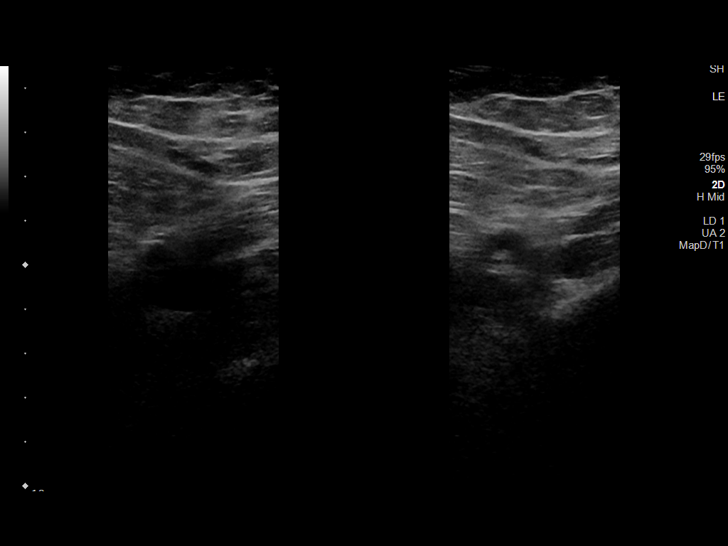
[im 15/29]
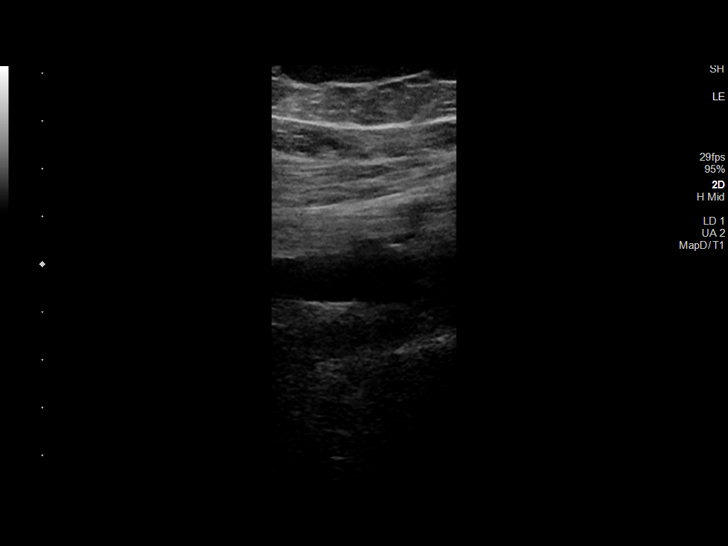
[im 18/29]
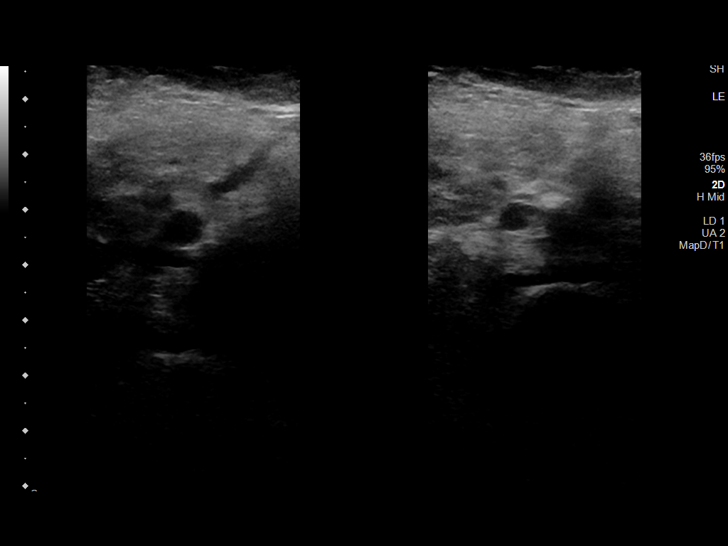
[im 20/29]
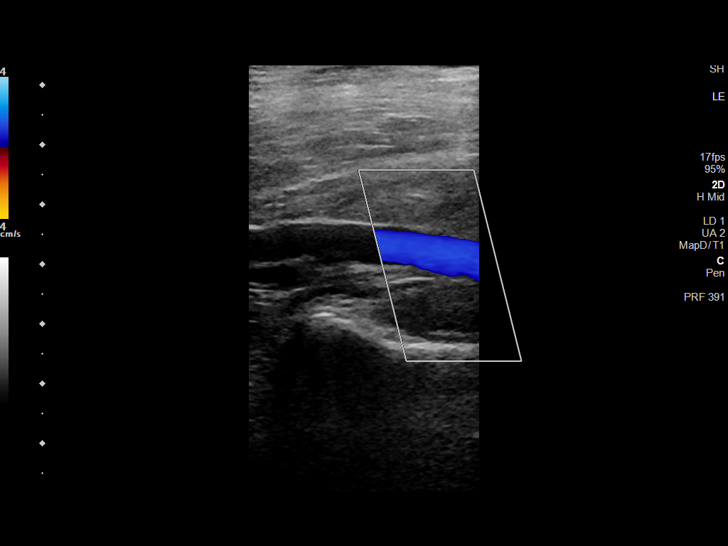
[im 22/29]
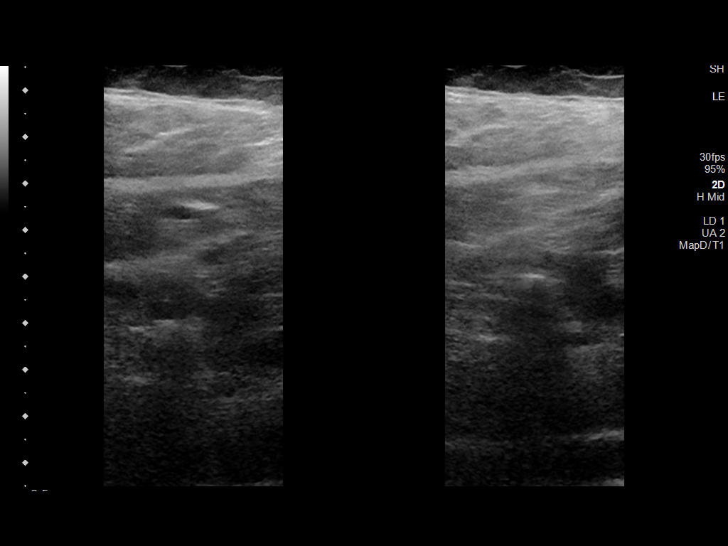
[im 24/29]
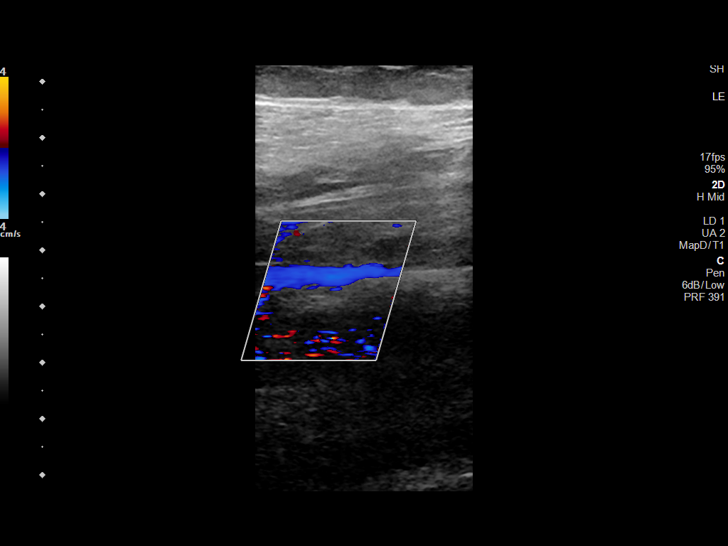
[im 26/29]
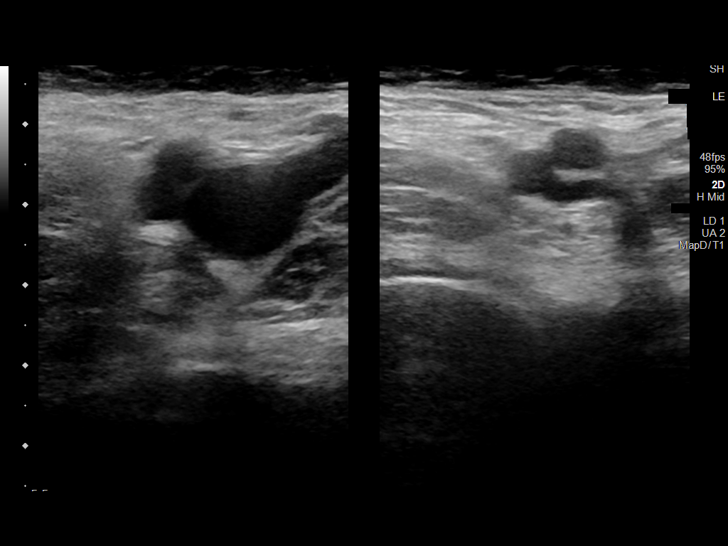
[im 29/29]
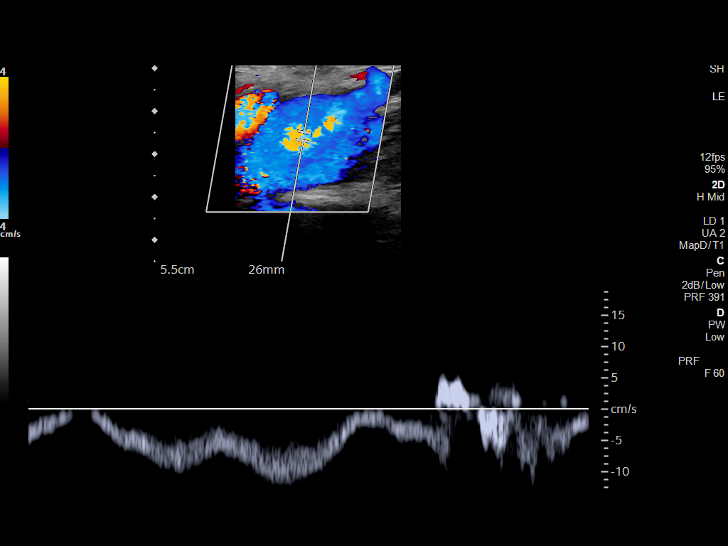

[14 of 24 positions shown; findings below may reference images not displayed]

FINDINGS: VENOUS

Normal compressibility of the common femoral, superficial femoral,
and popliteal veins, as well as the visualized calf veins.
Visualized portions of profunda femoral vein and great saphenous
vein unremarkable. No filling defects to suggest DVT on grayscale or
color Doppler imaging. Doppler waveforms show normal direction of
venous flow, normal respiratory plasticity and response to
augmentation.

Limited views of the contralateral common femoral vein are
unremarkable.

OTHER

None.

Limitations: none
IMPRESSION: Negative.

## 2022-11-26 ENCOUNTER — Telehealth: Payer: BC Managed Care – PPO | Admitting: Physician Assistant

## 2022-11-26 DIAGNOSIS — G43801 Other migraine, not intractable, with status migrainosus: Secondary | ICD-10-CM | POA: Diagnosis not present

## 2022-11-26 MED ORDER — SUMATRIPTAN SUCCINATE 25 MG PO TABS: 25.0000 mg | ORAL_TABLET | ORAL | 0 refills | Status: DC | PRN

## 2022-11-26 NOTE — Progress Notes (Signed)
Virtual Visit Consent   Kristen Haney, you are scheduled for a virtual visit with a [facility] provider today. Just as with appointments in the office, your consent must be obtained to participate. Your consent will be active for this visit and any virtual visit you may have with one of our providers in the next 365 days. If you have a MyChart account, a copy of this consent can be sent to you electronically.  As this is a virtual visit, video technology does not allow for your provider to perform a traditional examination. This may limit your provider's ability to fully assess your condition. If your provider identifies any concerns that need to be evaluated in person or the need to arrange testing (such as labs, EKG, etc.), we will make arrangements to do so. Although advances in technology are sophisticated, we cannot ensure that it will always work on either your end or our end. If the connection with a video visit is poor, the visit may have to be switched to a telephone visit. With either a video or telephone visit, we are not always able to ensure that we have a secure connection.  By engaging in this virtual visit, you consent to the provision of healthcare and authorize for your insurance to be billed (if applicable) for the services provided during this visit. Depending on your insurance coverage, you may receive a charge related to this service.  I need to obtain your verbal consent now. Are you willing to proceed with your visit today? Kristen Haney has provided verbal consent on 11/26/2022 for a virtual visit (video or telephone). Mar Daring, PA-C  Date: 11/26/2022 2:44 PM  Virtual Visit via Video Note   I, Mar Daring, connected with  Kristen Haney  ([id number], Apr 26, 1991) on 11/26/22 at  2:45 PM EST by a video-enabled telemedicine application and verified that I am speaking with the correct person using two identifiers.  Location: Patient: Virtual Visit Location  Patient: Home Provider: Virtual Visit Location Provider: Home Office   I discussed the limitations of evaluation and management by telemedicine and the availability of in person appointments. The patient expressed understanding and agreed to proceed.    History of Present Illness: Kristen Haney is a 31 y.o. who identifies as a female who was assigned female at birth, and is being seen today for migraines.  HPI: Migraine  This is a recurrent problem. Episode onset: migraines started when she was 11. When they start they can last a week at a time; Migraine started last week and had to leave work early. The problem occurs constantly. The problem has been unchanged. The pain is located in the Frontal region. The pain does not radiate. The pain quality is similar to prior headaches. The quality of the pain is described as aching, throbbing and sharp. The pain is moderate. Associated symptoms include nausea and photophobia. Pertinent negatives include no ear pain, eye pain, eye redness, eye watering, fever, phonophobia, scalp tenderness or vomiting. Nothing aggravates the symptoms. She has tried Excedrin and darkened room (bc powder) for the symptoms. The treatment provided no relief. Her past medical history is significant for migraine headaches.     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   Intentional drug overdose (HCC)    Sickle cell trait (HCC)     Allergies: No Known Allergies Medications:  Current Outpatient Medications:    albuterol (VENTOLIN HFA) 108 (90 Base) MCG/ACT inhaler, Inhale 1-2 puffs into the lungs every 6 (six) hours as needed for wheezing or  shortness of breath., Disp: 6.7 g, Rfl: 0   cyclobenzaprine (FLEXERIL) 10 MG tablet, Take 0.5-1 tablets (5-10 mg total) by mouth 3 (three) times daily as needed for muscle spasms., Disp: 30 tablet, Rfl: 5   escitalopram (LEXAPRO) 10 MG tablet, Take 10 mg by mouth daily., Disp: , Rfl:    hydrOXYzine (ATARAX) 50 MG tablet, Take 100 mg by mouth at bedtime., Disp: , Rfl:    ibuprofen (ADVIL) 600 MG tablet, Take 1 tablet (600 mg total) by mouth every 8 (eight) hours as needed., Disp: 30 tablet, Rfl: 5   lamoTRIgine (LAMICTAL) 25 MG tablet, Take 50 mg by mouth at bedtime., Disp: , Rfl:    SUMAtriptan (IMITREX) 25 MG tablet, Take 1 tablet (25 mg total) by mouth every 2 (two) hours as needed for migraine. May repeat in 2 hours if headache persists or recurs., Disp: 10 tablet, Rfl: 0  Observations/Objective: Patient is well-developed, well-nourished in no acute distress.  Resting comfortably at home.  Head is normocephalic, atraumatic.  No labored breathing.  Speech is clear and coherent with logical content.  Patient is alert and oriented at baseline.    Assessment and Plan: 1. Other migraine with status migrainosus, not intractable - SUMAtriptan (IMITREX) 25 MG tablet; Take 1 tablet (25 mg total) by mouth every 2 (two) hours as needed for migraine. May repeat in 2 hours if headache persists or recurs.  Dispense: 10 tablet; Refill: 0  - Never tried any prescription abortive therapies - Will try Imitrex as above - Discussed following up with PCP for long-term management of migraines - Seek in person evaluation if symptoms worsen or fail to improve  Follow Up Instructions: I discussed the assessment and treatment plan with the patient. The patient was provided an opportunity to ask questions and all were answered. The patient agreed with the plan and demonstrated an understanding of the instructions.  A copy of instructions were sent to the patient via MyChart unless otherwise noted below.    The  patient was advised to call back or seek an in-person evaluation if the symptoms worsen or if the condition fails to improve as anticipated.  Time:  I spent 11 minutes with the patient via telehealth technology discussing the above problems/concerns.    Margaretann Loveless, PA-C

## 2022-11-26 NOTE — Patient Instructions (Signed)
Gomez Cleverly, thank you for joining Margaretann Loveless, PA-C for today's virtual visit.  While this provider is not your primary care provider (PCP), if your PCP is located in our provider database this encounter information will be shared with them immediately following your visit.   A [facility] MyChart account gives you access to today's visit and all your visits, tests, and labs performed at [facility] " click here if you don't have a [facility] MyChart account or go to [email or web address]  Consent: (Patient) Kristen Haney provided verbal consent for this virtual visit at the beginning of the encounter.  Current Medications:  Current Outpatient Medications:    albuterol (VENTOLIN HFA) 108 (90 Base) MCG/ACT inhaler, Inhale 1-2 puffs into the lungs every 6 (six) hours as needed for wheezing or shortness of breath., Disp: 6.7 g, Rfl: 0   cyclobenzaprine (FLEXERIL) 10 MG tablet, Take 0.5-1 tablets (5-10 mg total) by mouth 3 (three) times daily as needed for muscle spasms., Disp: 30 tablet, Rfl: 5   escitalopram (LEXAPRO) 10 MG tablet, Take 10 mg by mouth daily., Disp: , Rfl:    hydrOXYzine (ATARAX) 50 MG tablet, Take 100 mg by mouth at bedtime., Disp: , Rfl:    ibuprofen (ADVIL) 600 MG tablet, Take 1 tablet (600 mg total) by mouth every 8 (eight) hours as needed., Disp: 30 tablet, Rfl: 5   lamoTRIgine (LAMICTAL) 25 MG tablet, Take 50 mg by mouth at bedtime., Disp: , Rfl:    SUMAtriptan (IMITREX) 25 MG tablet, Take 1 tablet (25 mg total) by mouth every 2 (two) hours as needed for migraine. May repeat in 2 hours if headache persists or recurs., Disp: 10 tablet, Rfl: 0   Medications ordered in this encounter:  Meds ordered this encounter  Medications   SUMAtriptan (IMITREX) 25 MG tablet    Sig: Take 1 tablet (25 mg total) by mouth every 2 (two) hours as needed for migraine. May repeat in 2 hours if headache persists or recurs.    Dispense:  10 tablet    Refill:  0     Order Specific Question:   Supervising Provider    Answer:   Merrilee Jansky [id number]     *If you need refills on other medications prior to your next appointment, please contact your pharmacy*  Follow-Up: Call back or seek an in-person evaluation if the symptoms worsen or if the condition fails to improve as anticipated.  [facility] [phone]  Other Instructions  Migraine Headache A migraine headache is an intense, throbbing pain on one side or both sides of the head. Migraine headaches may also cause other symptoms, such as nausea, vomiting, and sensitivity to light and noise. A migraine headache can last from 4 hours to 3 days. Talk with your doctor about what things may bring on (trigger) your migraine headaches. What are the causes? The exact cause of this condition is not known. However, a migraine may be caused when nerves in the brain become irritated and release chemicals that cause inflammation of blood vessels. This inflammation causes pain. This condition may be triggered or caused by: Drinking alcohol. Smoking. Taking medicines, such as: Medicine used to treat chest pain (nitroglycerin). Birth control pills. Estrogen. Certain blood pressure medicines. Eating or drinking products that contain nitrates, glutamate, aspartame, or tyramine. Aged cheeses, chocolate, or caffeine may also be triggers. Doing physical activity. Other things that may trigger a migraine headache include: Menstruation. Pregnancy. Hunger. Stress. Lack of sleep or too  much sleep. Weather changes. Fatigue. What increases the risk? The following factors may make you more likely to experience migraine headaches: Being a certain age. This condition is more common in people who are 7325-33 years old. Being female. Having a family history of migraine headaches. Being Caucasian. Having a mental health condition, such as depression or anxiety. Being obese. What are the signs or  symptoms? The main symptom of this condition is pulsating or throbbing pain. This pain may: Happen in any area of the head, such as on one side or both sides. Interfere with daily activities. Get worse with physical activity. Get worse with exposure to bright lights or loud noises. Other symptoms may include: Nausea. Vomiting. Dizziness. General sensitivity to bright lights, loud noises, or smells. Before you get a migraine headache, you may get warning signs (an aura). An aura may include: Seeing flashing lights or having blind spots. Seeing bright spots, halos, or zigzag lines. Having tunnel vision or blurred vision. Having numbness or a tingling feeling. Having trouble talking. Having muscle weakness. Some people have symptoms after a migraine headache (postdromal phase), such as: Feeling tired. Difficulty concentrating. How is this diagnosed? A migraine headache can be diagnosed based on: Your symptoms. A physical exam. Tests, such as: CT scan or an MRI of the head. These imaging tests can help rule out other causes of headaches. Taking fluid from the spine (lumbar puncture) and analyzing it (cerebrospinal fluid analysis, or CSF analysis). How is this treated? This condition may be treated with medicines that: Relieve pain. Relieve nausea. Prevent migraine headaches. Treatment for this condition may also include: Acupuncture. Lifestyle changes like avoiding foods that trigger migraine headaches. Biofeedback. Cognitive behavioral therapy. Follow these instructions at home: Medicines Take over-the-counter and prescription medicines only as told by your health care provider. Ask your health care provider if the medicine prescribed to you: Requires you to avoid driving or using heavy machinery. Can cause constipation. You may need to take these actions to prevent or treat constipation: Drink enough fluid to keep your urine pale yellow. Take over-the-counter or  prescription medicines. Eat foods that are high in fiber, such as beans, whole grains, and fresh fruits and vegetables. Limit foods that are high in fat and processed sugars, such as fried or sweet foods. Lifestyle Do not drink alcohol. Do not use any products that contain nicotine or tobacco, such as cigarettes, e-cigarettes, and chewing tobacco. If you need help quitting, ask your health care provider. Get at least 8 hours of sleep every night. Find ways to manage stress, such as meditation, deep breathing, or yoga. General instructions Keep a journal to find out what may trigger your migraine headaches. For example, write down: What you eat and drink. How much sleep you get. Any change to your diet or medicines. If you have a migraine headache: Avoid things that make your symptoms worse, such as bright lights. It may help to lie down in a dark, quiet room. Do not drive or use heavy machinery. Ask your health care provider what activities are safe for you while you are experiencing symptoms. Keep all follow-up visits as told by your health care provider. This is important. Contact a health care provider if: You develop symptoms that are different or more severe than your usual migraine headache symptoms. You have more than 15 headache days in one month. Get help right away if: Your migraine headache becomes severe. Your migraine headache lasts longer than 72 hours. You have a fever. You  have a stiff neck. You have vision loss. Your muscles feel weak or like you cannot control them. You start to lose your balance often. You have trouble walking. You faint. You have a seizure. Summary A migraine headache is an intense, throbbing pain on one side or both sides of the head. Migraines may also cause other symptoms, such as nausea, vomiting, and sensitivity to light and noise. This condition may be treated with medicines and lifestyle changes. You may also need to avoid certain things  that trigger a migraine headache. Keep a journal to find out what may trigger your migraine headaches. Contact your health care provider if you have more than 15 headache days in a month or you develop symptoms that are different or more severe than your usual migraine headache symptoms. This information is not intended to replace advice given to you by your health care provider. Make sure you discuss any questions you have with your health care provider. Document Revised: 05/31/2022 Document Reviewed: 01/29/2019 Elsevier Patient Education  2023 Elsevier Inc.   Sumatriptan Tablets What is this medication? SUMATRIPTAN (soo ma TRIP tan) treats migraines. It works by blocking pain signals and narrowing blood vessels in the brain. It belongs to a group of medications called triptans. It is not used to prevent migraines. This medicine may be used for other purposes; ask your health care provider or pharmacist if you have questions. COMMON BRAND NAME(S): Imitrex, Migraine Pack What should I tell my care team before I take this medication? They need to know if you have any of these conditions: Cigarette smoker Circulation problems in fingers and toes Heart disease High blood pressure High blood sugar (diabetes) High cholesterol History of irregular heartbeat History of stroke Kidney disease Liver disease Stomach or intestine problems An unusual or allergic reaction to sumatriptan, other medications, foods, dyes, or preservatives Pregnant or trying to get pregnant Breast-feeding How should I use this medication? Take this medication by mouth with a glass of water. Follow the directions on the prescription label. Do not take it more often than directed. Talk to your care team regarding the use of this medication in children. Special care may be needed. Overdosage: If you think you have taken too much of this medicine contact a poison control center or emergency room at once. NOTE: This  medicine is only for you. Do not share this medicine with others. What if I miss a dose? This does not apply. This medication is not for regular use. What may interact with this medication? Do not take this medication with any of the following: Certain medications for migraine headache like almotriptan, eletriptan, frovatriptan, naratriptan, rizatriptan, sumatriptan, zolmitriptan Ergot alkaloids like dihydroergotamine, ergonovine, ergotamine, methylergonovine MAOIs like Carbex, Eldepryl, Marplan, Nardil, and Parnate This medication may also interact with the following: Certain medications for depression, anxiety, or psychotic disorders This list may not describe all possible interactions. Give your health care provider a list of all the medicines, herbs, non-prescription drugs, or dietary supplements you use. Also tell them if you smoke, drink alcohol, or use illegal drugs. Some items may interact with your medicine. What should I watch for while using this medication? Visit your care team for regular checks on your progress. Tell your care team if your symptoms do not start to get better or if they get worse. You may get drowsy or dizzy. Do not drive, use machinery, or do anything that needs mental alertness until you know how this medication affects you. Do not stand  up or sit up quickly, especially if you are an older patient. This reduces the risk of dizzy or fainting spells. Alcohol may interfere with the effect of this medication. Tell your care team right away if you have any change in your eyesight. If you take migraine medications for 10 or more days a month, your migraines may get worse. Keep a diary of headache days and medication use. Contact your care team if your migraine attacks occur more frequently. What side effects may I notice from receiving this medication? Side effects that you should report to your care team as soon as possible: Allergic reactions--skin rash, itching, hives,  swelling of the face, lips, tongue, or throat Burning, pain, tingling, or color changes in the legs or feet Heart attack--pain or tightness in the chest, shoulders, arms, or jaw, nausea, shortness of breath, cold or clammy skin, feeling faint or lightheaded Heart rhythm changes--fast or irregular heartbeat, dizziness, feeling faint or lightheaded, chest pain, trouble breathing Increase in blood pressure Raynaud's--cool, numb, or painful fingers or toes that may change color from pale, to blue, to red Seizures Serotonin syndrome--irritability, confusion, fast or irregular heartbeat, muscle stiffness, twitching muscles, sweating, high fever, seizure, chills, vomiting, diarrhea Stroke--sudden numbness or weakness of the face, arm, or leg, trouble speaking, confusion, trouble walking, loss of balance or coordination, dizziness, severe headache, change in vision Sudden or severe stomach pain, nausea, vomiting, fever, or bloody diarrhea Vision loss Side effects that usually do not require medical attention (report to your care team if they continue or are bothersome): Dizziness General discomfort or fatigue This list may not describe all possible side effects. Call your doctor for medical advice about side effects. You may report side effects to FDA at 1-800-FDA-1088. Where should I keep my medication? Keep out of the reach of children and pets. Store at room temperature between 2 and 30 degrees C (36 and 86 degrees F). Throw away any unused medication after the expiration date. NOTE: This sheet is a summary. It may not cover all possible information. If you have questions about this medicine, talk to your doctor, pharmacist, or health care provider.  2023 Elsevier/Gold Standard (2020-12-19 00:00:00)    If you have been instructed to have an in-person evaluation today at a local Urgent Care facility, please use the link below. It will take you to a list of all of our available [facility], including address, phone number and hours of operation. Please do not delay care.  [facility]  If you or a family member do not have a primary care provider, use the link below to schedule a visit and establish care. When you choose a [facility] primary care physician or advanced practice provider, you gain a long-term partner in health. Find a Primary Care Provider  Learn more about [facility] in-office and virtual care options: [facility] - Get Care Now

## 2022-12-05 ENCOUNTER — Telehealth: Payer: BC Managed Care – PPO

## 2022-12-05 ENCOUNTER — Telehealth: Payer: BC Managed Care – PPO | Admitting: Physician Assistant

## 2022-12-05 DIAGNOSIS — G43019 Migraine without aura, intractable, without status migrainosus: Secondary | ICD-10-CM

## 2022-12-05 MED ORDER — PROMETHAZINE HCL 25 MG PO TABS: 25.0000 mg | ORAL_TABLET | Freq: Three times a day (TID) | ORAL | 0 refills | Status: DC | PRN

## 2022-12-05 MED ORDER — PREDNISONE 20 MG PO TABS: 40.0000 mg | ORAL_TABLET | Freq: Every day | ORAL | 0 refills | Status: AC

## 2022-12-05 NOTE — Progress Notes (Signed)
Virtual Visit Consent   Kristen Haney, you are scheduled for a virtual visit with a [facility] provider today. Just as with appointments in the office, your consent must be obtained to participate. Your consent will be active for this visit and any virtual visit you may have with one of our providers in the next 365 days. If you have a MyChart account, a copy of this consent can be sent to you electronically.  As this is a virtual visit, video technology does not allow for your provider to perform a traditional examination. This may limit your provider's ability to fully assess your condition. If your provider identifies any concerns that need to be evaluated in person or the need to arrange testing (such as labs, EKG, etc.), we will make arrangements to do so. Although advances in technology are sophisticated, we cannot ensure that it will always work on either your end or our end. If the connection with a video visit is poor, the visit may have to be switched to a telephone visit. With either a video or telephone visit, we are not always able to ensure that we have a secure connection.  By engaging in this virtual visit, you consent to the provision of healthcare and authorize for your insurance to be billed (if applicable) for the services provided during this visit. Depending on your insurance coverage, you may receive a charge related to this service.  I need to obtain your verbal consent now. Are you willing to proceed with your visit today? Christi Wirick has provided verbal consent on 12/05/2022 for a virtual visit (video or telephone). Kristen Haney, [place]  Date: 12/05/2022 2:21 PM  Virtual Visit via Video Note   I, Kristen Haney, connected with  Kristen Haney  ([id number], Jun 16, 1991) on 12/05/22 at  2:00 PM EST by a video-enabled telemedicine application and verified that I am speaking with the correct person using two identifiers.  Location: Patient: Virtual Visit Location  Patient: Home Provider: Virtual Visit Location Provider: Home Office   I discussed the limitations of evaluation and management by telemedicine and the availability of in person appointments. The patient expressed understanding and agreed to proceed.    History of Present Illness: Kristen Haney is a 31 y.o. who identifies as a female who was assigned female at birth, and is being seen today for for continued almost daily migraine headaches despite recent treatment with Imitrex on 11/27.  Notes she typically wakes up without a headache but vomiting present during the day.  Is typically unilateral and associated with photophobia and nausea.  Denies vision changes or altered mental status.  Was trying to take the Imitrex given at her last visit but notes this caused her to feel very sick.  As such, she stopped taking it.  Due to the nausea she has experienced, she is also missed a couple days of her depression/anxiety medicines which she feels is making her feel worse.  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   Intentional drug overdose (HCC)    Sickle cell trait (HCC)     Allergies:  No Known Allergies Medications:  Current Outpatient Medications:    predniSONE (DELTASONE) 20 MG tablet, Take 2 tablets (40 mg total) by mouth daily with breakfast for 3 days., Disp: 6 tablet, Rfl: 0   promethazine (PHENERGAN) 25 MG tablet, Take 1 tablet (25 mg total) by mouth every 8 (eight) hours as needed for nausea or vomiting., Disp: 15 tablet, Rfl: 0   albuterol (VENTOLIN HFA)  108 (90 Base) MCG/ACT inhaler, Inhale 1-2 puffs into the lungs every 6 (six) hours as needed for wheezing or shortness of breath., Disp: 6.7 g, Rfl: 0   cyclobenzaprine (FLEXERIL) 10 MG tablet, Take 0.5-1 tablets (5-10 mg total) by mouth 3 (three) times daily as needed for muscle spasms., Disp: 30 tablet, Rfl: 5   escitalopram (LEXAPRO) 10 MG tablet, Take 10 mg by mouth daily., Disp: , Rfl:    hydrOXYzine (ATARAX) 50 MG tablet, Take 100 mg by mouth at bedtime., Disp: , Rfl:    ibuprofen (ADVIL) 600 MG tablet, Take 1 tablet (600 mg total) by mouth every 8 (eight) hours as needed., Disp: 30 tablet, Rfl: 5   lamoTRIgine (LAMICTAL) 25 MG tablet, Take 50 mg by mouth at bedtime., Disp: , Rfl:   Observations/Objective: Patient is well-developed, well-nourished in no acute distress.  Resting comfortably at home.  Head is normocephalic, atraumatic.  No labored breathing. Speech is clear and coherent with logical content.  Patient is alert and oriented at baseline.   Assessment and Plan: 1. Intractable migraine without aura and without status migrainosus - promethazine (PHENERGAN) 25 MG tablet; Take 1 tablet (25 mg total) by mouth every 8 (eight) hours as needed for nausea or vomiting.  Dispense: 15 tablet; Refill: 0 - predniSONE (DELTASONE) 20 MG tablet; Take 2 tablets (40 mg total) by mouth daily with breakfast for 3 days.  Dispense: 6 tablet; Refill: 0  Thankfully no alarm signs or symptoms present.  Given new daily headache, glad she has an in person follow-up with her primary care provider tomorrow for further evaluation and ongoing management.  For now, to break the headache cycle will start a prednisone burst for 3 days.  Phenergan for nausea as she notes she does not tolerate Zofran.  Strict ER precautions discussed.  Work note provided.  Follow Up Instructions: I discussed the assessment and treatment plan with the patient. The patient was provided an opportunity to ask questions and all were  answered. The patient agreed with the plan and demonstrated an understanding of the instructions.  A copy of instructions were sent to the patient via MyChart unless otherwise noted below.   Patient has requested to receive PHI (AVS, Work Notes, etc) pertaining to this video visit through e-mail as they are currently without active MyChart. They have voiced understand that email is not considered secure and their health information could be viewed by someone other than the patient.   The patient was advised to call back or seek an in-person evaluation if the symptoms worsen or if the condition fails to improve as anticipated.  Time:  I spent 10 minutes with the patient via telehealth technology discussing the above problems/concerns.    Kristen Climes, PA-C

## 2022-12-05 NOTE — Patient Instructions (Signed)
  Gomez Cleverly, thank you for joining Piedad Climes, PA-C for today's virtual visit.  While this provider is not your primary care provider (PCP), if your PCP is located in our provider database this encounter information will be shared with them immediately following your visit.   A [facility] MyChart account gives you access to today's visit and all your visits, tests, and labs performed at [facility] " click here if you don't have a [facility] MyChart account or go to [email or web address]  Consent: (Patient) Kristen Haney provided verbal consent for this virtual visit at the beginning of the encounter.  Current Medications:  Current Outpatient Medications:    albuterol (VENTOLIN HFA) 108 (90 Base) MCG/ACT inhaler, Inhale 1-2 puffs into the lungs every 6 (six) hours as needed for wheezing or shortness of breath., Disp: 6.7 g, Rfl: 0   cyclobenzaprine (FLEXERIL) 10 MG tablet, Take 0.5-1 tablets (5-10 mg total) by mouth 3 (three) times daily as needed for muscle spasms., Disp: 30 tablet, Rfl: 5   escitalopram (LEXAPRO) 10 MG tablet, Take 10 mg by mouth daily., Disp: , Rfl:    hydrOXYzine (ATARAX) 50 MG tablet, Take 100 mg by mouth at bedtime., Disp: , Rfl:    ibuprofen (ADVIL) 600 MG tablet, Take 1 tablet (600 mg total) by mouth every 8 (eight) hours as needed., Disp: 30 tablet, Rfl: 5   lamoTRIgine (LAMICTAL) 25 MG tablet, Take 50 mg by mouth at bedtime., Disp: , Rfl:    SUMAtriptan (IMITREX) 25 MG tablet, Take 1 tablet (25 mg total) by mouth every 2 (two) hours as needed for migraine. May repeat in 2 hours if headache persists or recurs., Disp: 10 tablet, Rfl: 0   Medications ordered in this encounter:  No orders of the defined types were placed in this encounter.    *If you need refills on other medications prior to your next appointment, please contact your pharmacy*  Follow-Up: Call back or seek an in-person evaluation if the symptoms worsen or if the  condition fails to improve as anticipated.  [facility] [phone]  Other Instructions Please keep well-hydrated and get plenty of rest. Start the prednisone, taking as directed. OTC Tylenol as directed, when needed, for breakthrough headache pain. The promethazine is to use as directed for nausea or vomiting.  This can make you sleepy, so no driving or operating heavy machinery after taking this medication. Make sure to follow-up with your primary care provider tomorrow for further evaluation and ongoing management. If anything acutely worsens before that time, I want you to seek an in person evaluation ASAP.  Please do not delay care.   If you have been instructed to have an in-person evaluation today at a local Urgent Care facility, please use the link below. It will take you to a list of all of our available [facility], including address, phone number and hours of operation. Please do not delay care.  [facility]  If you or a family member do not have a primary care provider, use the link below to schedule a visit and establish care. When you choose a [facility] primary care physician or advanced practice provider, you gain a long-term partner in health. Find a Primary Care Provider  Learn more about [facility]'s in-office and virtual care options:  - Get Care Now

## 2022-12-06 ENCOUNTER — Encounter: Payer: Self-pay | Admitting: Family Medicine

## 2022-12-06 ENCOUNTER — Ambulatory Visit (INDEPENDENT_AMBULATORY_CARE_PROVIDER_SITE_OTHER): Payer: BC Managed Care – PPO | Admitting: Family Medicine

## 2022-12-06 VITALS — BP 100/68 | HR 90 | Temp 98.3°F | Ht 68.5 in | Wt 227.8 lb

## 2022-12-06 DIAGNOSIS — L732 Hidradenitis suppurativa: Secondary | ICD-10-CM

## 2022-12-06 DIAGNOSIS — B3731 Acute candidiasis of vulva and vagina: Secondary | ICD-10-CM | POA: Diagnosis not present

## 2022-12-06 DIAGNOSIS — G43019 Migraine without aura, intractable, without status migrainosus: Secondary | ICD-10-CM | POA: Diagnosis not present

## 2022-12-06 MED ORDER — FLUCONAZOLE 150 MG PO TABS: 150.0000 mg | ORAL_TABLET | Freq: Once | ORAL | 0 refills | Status: AC

## 2022-12-06 MED ORDER — KETOROLAC TROMETHAMINE 60 MG/2ML IM SOLN
60.0000 mg | Freq: Once | INTRAMUSCULAR | Status: AC
  Administered 2022-12-06: 60 mg via INTRAMUSCULAR

## 2022-12-06 MED ORDER — NURTEC 75 MG PO TBDP: 1.0000 | ORAL_TABLET | Freq: Once | ORAL | 2 refills | Status: DC | PRN

## 2022-12-06 NOTE — Assessment & Plan Note (Signed)
Will place referral to general surgery for their recommendations. If surgery is not recommended then we can discuss starting the medication regimen. She will return to clinic after her consultation.

## 2022-12-06 NOTE — Progress Notes (Signed)
Established Patient Office Visit  Subjective   Patient ID: Kristen Haney, female    DOB: [birth year]  Age: 31 y.o. MRN: [id number]  Chief Complaint  Patient presents with   Migraine    X1 week, states she had a virtual visit last week and was given Prednisone and Promethazine with no relief   Emesis    Pt reports she has been seen twice for the same migraine. Has a history of migraines since she was a child. States that the sumatriptan did not help her headache. She is becoming nauseous. Sensitivity to light and sound, slight blurry vision. States that she works in an [employer] and thinks there is mold in her office. No URI symptoms. No fever/chills.   Pt is also reporting that her left axilla is still extremely sore and continues to drain ffrom her history of Hydradenitis suppuritva. We discussed medication options vs referral to surgery. I suggested that we send her to general surgery first then if surgery Is not recommended we can discuss starting the medication regimen.   Pt is also reporting increasing vaginal itching, discomfort and white discharge. States that it started after the doxycycline and hasn't improved.   Migraine  This is a recurrent problem. The current episode started in the past 7 days. The problem has been unchanged. The pain is located in the Bilateral region. The pain does not radiate. The pain quality is similar to prior headaches. Associated symptoms include vomiting.  Emesis       Review of Systems  Gastrointestinal:  Positive for vomiting.  All other systems reviewed and are negative.     Objective:     BP 100/68 (BP Location: Left Arm, Patient Position: Sitting, Cuff Size: Large)   Pulse 90   Temp 98.3 F (36.8 C) (Oral)   Ht 5' 8.5" (1.74 m)   Wt 227 lb 12.8 oz (103.3 kg)   LMP 11/26/2022 (Exact Date)   SpO2 98%   BMI 34.13 kg/m    Physical Exam Vitals reviewed.  Constitutional:      Appearance: Normal appearance. She is  well-groomed and normal weight.  Eyes:     Conjunctiva/sclera: Conjunctivae normal.  Cardiovascular:     Rate and Rhythm: Normal rate and regular rhythm.     Pulses: Normal pulses.     Heart sounds: S1 normal and S2 normal.  Pulmonary:     Effort: Pulmonary effort is normal.     Breath sounds: Normal air entry.  Musculoskeletal:     Right lower leg: No edema.     Left lower leg: No edema.  Skin:    Findings: Lesion (left axilla showing open draining clear fluid, no abcsess palpated, no erythema or edema of the skin) present.  Neurological:     Mental Status: She is alert and oriented to person, place, and time. Mental status is at baseline.     Gait: Gait is intact.  Psychiatric:        Mood and Affect: Mood and affect normal.        Speech: Speech normal.        Behavior: Behavior normal.        Judgment: Judgment normal.      No results found for any visits on 12/06/22.    The ASCVD Risk score (Arnett DK, et al., 2019) failed to calculate for the following reasons:   The 2019 ASCVD risk score is only valid for ages 62 to 41    Assessment &  Plan:   Problem List Items Addressed This Visit       Unprioritized   Hidradenitis suppurativa (Chronic)    Will place referral to general surgery for their recommendations. If surgery is not recommended then we can discuss starting the medication regimen. She will return to clinic after her consultation.      Relevant Medications   fluconazole (DIFLUCAN) 150 MG tablet   Other Relevant Orders   Ambulatory referral to General Surgery   Other Visit Diagnoses     Intractable migraine without aura and without status migrainosus    -  Primary   Relevant Medications   Injection of ketorolac 60 mg was given in office today for immediate relief. Pt failed triptans, will switch to nurtec 75 mg tablets once as needed for migrain to see if these will help. Work excuse written.   ketorolac (TORADOL) injection 60 mg (Completed)    Rimegepant Sulfate (NURTEC) 75 MG TBDP   Candidiasis of vagina       Relevant Medications   S/p abx use, will treat with dose of diflucan 150 mg x 1. fluconazole (DIFLUCAN) 150 MG tablet       Return for follow up after you see general surgery.    Karie Georges, MD

## 2022-12-07 ENCOUNTER — Telehealth: Payer: Self-pay | Admitting: *Deleted

## 2022-12-07 NOTE — Telephone Encounter (Signed)
Kristen Haney faxed a prior auth request for Nurtec 75mg .  PA could not be completed via [email or web address] with a note stating to call Pharmacy Mgmt at [phone].  Pharmacy Mgmt stated to contact [phone].  Form was downloaded, completed and faxed to [phone] along with office notes and insurance card.

## 2022-12-14 ENCOUNTER — Other Ambulatory Visit: Payer: Self-pay

## 2022-12-14 DIAGNOSIS — G43019 Migraine without aura, intractable, without status migrainosus: Secondary | ICD-10-CM

## 2022-12-14 MED ORDER — PROMETHAZINE HCL 25 MG PO TABS: 25.0000 mg | ORAL_TABLET | Freq: Three times a day (TID) | ORAL | 2 refills | Status: DC | PRN

## 2022-12-14 NOTE — Telephone Encounter (Signed)
Please advise 

## 2022-12-14 NOTE — Telephone Encounter (Signed)
Ok to refill the phenergan rx!

## 2022-12-17 DIAGNOSIS — F4312 Post-traumatic stress disorder, chronic: Secondary | ICD-10-CM | POA: Diagnosis not present

## 2022-12-20 ENCOUNTER — Telehealth: Payer: BC Managed Care – PPO | Admitting: Family Medicine

## 2022-12-20 DIAGNOSIS — R000 Tachycardia, unspecified: Secondary | ICD-10-CM

## 2022-12-20 NOTE — Patient Instructions (Signed)
Sinus Tachycardia  Sinus tachycardia is a fast heartbeat. In sinus tachycardia, the heart beats more than 100 times a minute. Sinus tachycardia starts in the part of the heart called the sinoatrial (SA) node. Sinus tachycardia may be harmless, or it may be a sign of a serious condition. What are the causes? This condition may be caused by: Exercise or exertion. A fever. Pain. Loss of body fluids (dehydration). Severe bleeding (hemorrhage). Anxiety and stress. Certain substances, including: Alcohol. Caffeine. Tobacco and nicotine products. Cold medicines. Illegal drugs. Medical conditions including: Heart disease. An infection. An overactive thyroid (hyperthyroidism). A lack of red blood cells (anemia). What are the signs or symptoms? Symptoms of this condition include: A feeling that the heart is beating fast or unevenly (palpitations). Suddenly noticing your heartbeat (cardiac awareness). Lightheadedness. Tiredness (fatigue). Shortness of breath. Chest pain. Nausea. Fainting. How is this diagnosed? This condition is diagnosed with: A physical exam. Tests or monitoring, such as: Blood tests. An electrocardiogram (ECG). This test measures the electrical activity of the heart. Ambulatory cardiac monitor. This records your heartbeats for 24 hours or more. You may be referred to a heart specialist (cardiologist). How is this treated? Treatment for this condition depends on the cause. Treatment may involve: Treating the underlying condition. Taking new medicines or changing your current medicines as told by your health care provider. Making changes to your diet or lifestyle. Follow these instructions at home: Lifestyle  Do not use any products that contain nicotine or tobacco. These products include cigarettes, chewing tobacco, and vaping devices, such as e-cigarettes. If you need help quitting, ask your health care provider. Do not use illegal drugs, such as  cocaine. Learn relaxation methods to help you when you get stressed or anxious. These include deep breathing. Avoid caffeine or other stimulants, including herbal stimulants that are found in energy drinks. Alcohol use  Do not drink alcohol if: Your health care provider tells you not to drink. You are pregnant, may be pregnant, or are planning to become pregnant. If you drink alcohol: Limit how much you have to: 0-1 drink a day for women. 0-2 drinks a day for men. Know how much alcohol is in your drink. In the [place] one drink equals one 12 oz bottle of beer (355 mL), one 5 oz glass of wine (148 mL), or one 1 oz glass of hard liquor (44 mL). General instructions Drink enough fluids to keep your urine pale yellow. Take over-the-counter and prescription medicines only as told by your health care provider. Ask your health care provider about taking vitamins, herbs, and supplements. Contact a health care provider if: You have vomiting or diarrhea that does not go away. You have a fever. You have weakness or dizziness. You feel faint. Get help right away if: You have pain in your chest, upper arms, jaw, or neck. You have palpitations that do not go away. Summary In sinus tachycardia, the heart beats more than 100 times a minute. Sinus tachycardia may be harmless, or it may be a sign of a serious condition. Treatment for this condition depends on the cause or the underlying condition. Get help right away if you have pain in your chest, upper arms, jaw, or neck. This information is not intended to replace advice given to you by your health care provider. Make sure you discuss any questions you have with your health care provider. Document Revised: 04/17/2022 Document Reviewed: 04/17/2022 Elsevier Patient Education  2023 Elsevier Inc.  

## 2022-12-20 NOTE — Progress Notes (Signed)
Virtual Visit Consent   Kristen Haney, you are scheduled for a virtual visit with a [facility] provider today. Just as with appointments in the office, your consent must be obtained to participate. Your consent will be active for this visit and any virtual visit you may have with one of our providers in the next 365 days. If you have a MyChart account, a copy of this consent can be sent to you electronically.  As this is a virtual visit, video technology does not allow for your provider to perform a traditional examination. This may limit your provider's ability to fully assess your condition. If your provider identifies any concerns that need to be evaluated in person or the need to arrange testing (such as labs, EKG, etc.), we will make arrangements to do so. Although advances in technology are sophisticated, we cannot ensure that it will always work on either your end or our end. If the connection with a video visit is poor, the visit may have to be switched to a telephone visit. With either a video or telephone visit, we are not always able to ensure that we have a secure connection.  By engaging in this virtual visit, you consent to the provision of healthcare and authorize for your insurance to be billed (if applicable) for the services provided during this visit. Depending on your insurance coverage, you may receive a charge related to this service.  I need to obtain your verbal consent now. Are you willing to proceed with your visit today? Kristen Haney has provided verbal consent on 12/20/2022 for a virtual visit (video or telephone). Kristen Curio, FNP  Date: 12/20/2022 5:34 PM  Virtual Visit via Video Note   I, Kristen Haney, connected with  Kristen Haney  ([id number], 1991-03-26) on 12/20/22 at  5:30 PM EST by a video-enabled telemedicine application and verified that I am speaking with the correct person using two identifiers.  Location: Patient: Virtual Visit Location Patient:  Home Provider: Virtual Visit Location Provider: Home Office   I discussed the limitations of evaluation and management by telemedicine and the availability of in person appointments. The patient expressed understanding and agreed to proceed.    History of Present Illness: Kristen Haney is a 31 y.o. who identifies as a female who was assigned female at birth, and is being seen today for increased HR with no chest pain, sob, dizziness. 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 She was not able to get apptmt with pcp yet. She drinks a couple of large coffees per day from [employer].  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   Intentional drug overdose (HCC)    Sickle cell trait (HCC)     Allergies: No Known Allergies Medications:  Current Outpatient Medications:    albuterol (VENTOLIN HFA) 108 (90 Base) MCG/ACT inhaler, Inhale 1-2 puffs into the lungs every 6 (six) hours as needed for wheezing or shortness of breath., Disp: 6.7 g, Rfl: 0   cyclobenzaprine (FLEXERIL) 10 MG tablet, Take 0.5-1 tablets (5-10 mg total) by mouth 3 (three) times daily as needed for muscle spasms., Disp: 30 tablet, Rfl: 5   escitalopram (LEXAPRO)  10 MG tablet, Take 10 mg by mouth daily., Disp: , Rfl:    hydrOXYzine (ATARAX) 50 MG tablet, Take 100 mg by mouth at bedtime., Disp: , Rfl:    ibuprofen (ADVIL) 600 MG tablet, Take 1 tablet (600 mg total) by mouth every 8 (eight) hours as needed., Disp: 30 tablet, Rfl: 5   lamoTRIgine (LAMICTAL) 25 MG tablet, Take 50 mg by mouth at bedtime., Disp: , Rfl:    promethazine (PHENERGAN) 25 MG tablet, Take 1  tablet (25 mg total) by mouth every 8 (eight) hours as needed for nausea or vomiting., Disp: 15 tablet, Rfl: 2   Rimegepant Sulfate (NURTEC) 75 MG TBDP, Take 1 tablet by mouth once as needed for up to 1 dose., Disp: 10 tablet, Rfl: 2  Observations/Objective: Patient is well-developed, well-nourished in no acute distress.  Resting comfortably  at home.  Head is normocephalic, atraumatic.  No labored breathing.  Speech is clear and coherent with logical content.  Patient is alert and oriented at baseline.    Assessment and Plan: 1. Increased heart rate  Decrease caffeine, follow up with pcp, ED if chest pain occurs or if HR is elevated and does not return to normal. Check pulse once a day at rest and write it down.   Follow Up Instructions: I discussed the assessment and treatment plan with the patient. The patient was provided an opportunity to ask questions and all were answered. The patient agreed with the plan and demonstrated an understanding of the instructions.  A copy of instructions were sent to the patient via MyChart unless otherwise noted below.     The patient was advised to call back or seek an in-person evaluation if the symptoms worsen or if the condition fails to improve as anticipated.  Time:  I spent 10 minutes with the patient via telehealth technology discussing the above problems/concerns.    Dellia Nims, FNP

## 2023-01-01 ENCOUNTER — Telehealth: Payer: BC Managed Care – PPO | Admitting: Nurse Practitioner

## 2023-01-01 DIAGNOSIS — M5441 Lumbago with sciatica, right side: Secondary | ICD-10-CM

## 2023-01-01 MED ORDER — PREDNISONE 10 MG (21) PO TBPK: ORAL_TABLET | ORAL | 0 refills | Status: DC

## 2023-01-01 NOTE — Progress Notes (Signed)
Virtual Visit Consent   [name], you are scheduled for a virtual visit with a [facility] provider today. Just as with appointments in the office, your consent must be obtained to participate. Your consent will be active for this visit and any virtual visit you may have with one of our providers in the next 365 days. If you have a MyChart account, a copy of this consent can be sent to you electronically.  As this is a virtual visit, video technology does not allow for your provider to perform a traditional examination. This may limit your provider's ability to fully assess your condition. If your provider identifies any concerns that need to be evaluated in person or the need to arrange testing (such as labs, EKG, etc.), we will make arrangements to do so. Although advances in technology are sophisticated, we cannot ensure that it will always work on either your end or our end. If the connection with a video visit is poor, the visit may have to be switched to a telephone visit. With either a video or telephone visit, we are not always able to ensure that we have a secure connection.  By engaging in this virtual visit, you consent to the provision of healthcare and authorize for your insurance to be billed (if applicable) for the services provided during this visit. Depending on your insurance coverage, you may receive a charge related to this service.  I need to obtain your verbal consent now. Are you willing to proceed with your visit today? Kristen Haney has provided verbal consent on 01/01/2023 for a virtual visit (video or telephone). Apolonio Schneiders, FNP  Date: 01/01/2023 7:21 PM  Virtual Visit via Video Note   I, Apolonio Schneiders, connected with  Kristen Haney  ([id number], 02/13/1991) on 01/01/23 at  7:45 PM EST by a video-enabled telemedicine application and verified that I am speaking with the correct person using two identifiers.  Location: Patient: Virtual Visit Location Patient:  Home Provider: Virtual Visit Location Provider: Home Office   I discussed the limitations of evaluation and management by telemedicine and the availability of in person appointments. The patient expressed understanding and agreed to proceed.    History of Present Illness: Kristen Haney is a 32 y.o. who identifies as a female who was assigned female at birth, and is being seen today for back pain.   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  The pain is worse with sitting and standing.   She has a prescription for Flexeril and has been using that without relief.   The pain radiates into her right leg.  She feels it is hard to put any pressure on her leg but denies weakness or numbness.    Problems:  Patient Active Problem List   Diagnosis Date Noted   Well woman exam with routine gynecological exam 11/08/2022   Hidradenitis suppurativa 10/15/2022   Dermatitis due to unknown cause 10/15/2022   Chronic neck pain 10/15/2022   PTSD (post-traumatic stress disorder) 06/14/2019   Major depressive disorder, recurrent severe without psychotic features ([place]) 06/14/2019   Intentional drug overdose ([place])    Sickle cell trait (HCC)     Allergies: No Known Allergies Medications:  Current Outpatient Medications:    albuterol (VENTOLIN HFA) 108 (90 Base) MCG/ACT inhaler, Inhale 1-2 puffs into the lungs every 6 (six) hours as needed for wheezing or shortness of  breath., Disp: 6.7 g, Rfl: 0   cyclobenzaprine (FLEXERIL) 10 MG tablet, Take 0.5-1 tablets (5-10 mg total) by mouth 3 (three) times daily as needed for muscle spasms., Disp: 30 tablet, Rfl: 5   escitalopram (LEXAPRO) 10 MG tablet, Take 10 mg by mouth daily., Disp: , Rfl:    hydrOXYzine (ATARAX) 50 MG tablet, Take 100 mg by mouth at bedtime., Disp: , Rfl:    ibuprofen (ADVIL) 600 MG tablet, Take 1 tablet (600 mg total) by mouth every 8 (eight) hours as  needed., Disp: 30 tablet, Rfl: 5   lamoTRIgine (LAMICTAL) 25 MG tablet, Take 50 mg by mouth at bedtime., Disp: , Rfl:    promethazine (PHENERGAN) 25 MG tablet, Take 1 tablet (25 mg total) by mouth every 8 (eight) hours as needed for nausea or vomiting., Disp: 15 tablet, Rfl: 2   Rimegepant Sulfate (NURTEC) 75 MG TBDP, Take 1 tablet by mouth once as needed for up to 1 dose., Disp: 10 tablet, Rfl: 2  Observations/Objective: Patient is well-developed, well-nourished in no acute distress.  Resting comfortably  at home.  Head is normocephalic, atraumatic.  No labored breathing.  Speech is clear and coherent with logical content.  Patient is alert and oriented at baseline.    Assessment and Plan: 1. Acute right-sided low back pain with right-sided sciatica  - predniSONE (STERAPRED UNI-PAK 21 TAB) 10 MG (21) TBPK tablet; Take 6 tablets on day one, 5 on day two, 4 on day three, 3 on day four, 2 on day five, and 1 on day six. Take with food.  Dispense: 21 tablet; Refill: 0     Follow Up Instructions: I discussed the assessment and treatment plan with the patient. The patient was provided an opportunity to ask questions and all were answered. The patient agreed with the plan and demonstrated an understanding of the instructions.  A copy of instructions were sent to the patient via MyChart unless otherwise noted below.   The patient was advised to call back or seek an in-person evaluation if the symptoms worsen or if the condition fails to improve as anticipated.  Time:  I spent 10 minutes with the patient via telehealth technology discussing the above problems/concerns.    Apolonio Schneiders, FNP

## 2023-01-18 ENCOUNTER — Telehealth: Payer: BC Managed Care – PPO | Admitting: Nurse Practitioner

## 2023-01-18 DIAGNOSIS — M5431 Sciatica, right side: Secondary | ICD-10-CM | POA: Diagnosis not present

## 2023-01-18 MED ORDER — CYCLOBENZAPRINE HCL 10 MG PO TABS: 10.0000 mg | ORAL_TABLET | Freq: Three times a day (TID) | ORAL | 0 refills | Status: AC | PRN

## 2023-01-18 NOTE — Progress Notes (Signed)
Virtual Visit Consent   Kristen Haney, you are scheduled for a virtual visit with a [facility] provider today. Just as with appointments in the office, your consent must be obtained to participate. Your consent will be active for this visit and any virtual visit you may have with one of our providers in the next 365 days. If you have a MyChart account, a copy of this consent can be sent to you electronically.  As this is a virtual visit, video technology does not allow for your provider to perform a traditional examination. This may limit your provider's ability to fully assess your condition. If your provider identifies any concerns that need to be evaluated in person or the need to arrange testing (such as labs, EKG, etc.), we will make arrangements to do so. Although advances in technology are sophisticated, we cannot ensure that it will always work on either your end or our end. If the connection with a video visit is poor, the visit may have to be switched to a telephone visit. With either a video or telephone visit, we are not always able to ensure that we have a secure connection.  By engaging in this virtual visit, you consent to the provision of healthcare and authorize for your insurance to be billed (if applicable) for the services provided during this visit. Depending on your insurance coverage, you may receive a charge related to this service.  I need to obtain your verbal consent now. Are you willing to proceed with your visit today? Kristen Haney has provided verbal consent on 01/18/2023 for a virtual visit (video or telephone). Apolonio Schneiders, FNP  Date: 01/18/2023 1:38 PM  Virtual Visit via Video Note   I, Apolonio Schneiders, connected with  Kristen Haney  ([id number], Apr 16, 1981) on 01/18/23 at  1:45 PM EST by a video-enabled telemedicine application and verified that I am speaking with the correct person using two identifiers.  Location: Patient: Virtual Visit Location Patient:  Home Provider: Virtual Visit Location Provider: Home Office   I discussed the limitations of evaluation and management by telemedicine and the availability of in person appointments. The patient expressed understanding and agreed to proceed.   Cc: "My scatica nerve is giving me a fit since last night while i was at my part time i been in pain all day its aching my leg kind of feels like its getting a little numb its hard to even my pants up." History of Present Illness: Kristen Haney is a 32 y.o. who identifies as a female who was assigned female at birth, and is being seen today for recurrent sciatica pain.These episodes have been recurrent since 2019 when she was pregnant    She was seen on 01/01/23 and treated with a prednisone taper, and was already using flexeril prior to that visit. At the time of her previous visit she denied numbness or weakness. She feels the trigger is when she has to stand for long periods of time at her second job which is at [employer].   She had some improvement with the prednisone and has run out of the flexeril.  Today she is complaining of pain that is making it difficult for her to even pull her pants up and she is also experiencing some numbness into her right leg. The most painful part is the back of her leg and into her buttock and hip.    Most recent imaging (XRAY) of spine was from 09/2019:  EXAM: LUMBAR SPINE - 2-3 VIEW  COMPARISON:  None.   FINDINGS: Frontal, lateral, and spot lumbosacral lateral images were obtained. There are 5 non-rib-bearing lumbar type vertebral bodies. There is slight lumbar dextroscoliosis. There is no fracture or spondylolisthesis. Disc spaces appear unremarkable. No erosive change.   IMPRESSION: Slight scoliosis. No fracture or spondylolisthesis. No evident arthropathy.  She has follow up with PCP 01/22/23 scheduled   Problems:  Patient Active Problem List   Diagnosis Date Noted   Well woman exam with routine  gynecological exam 11/08/2022   Hidradenitis suppurativa 10/15/2022   Dermatitis due to unknown cause 10/15/2022   Chronic neck pain 10/15/2022   PTSD (post-traumatic stress disorder) 06/14/2019   Major depressive disorder, recurrent severe without psychotic features ([place]) 06/14/2019   Intentional drug overdose ([place])    Sickle cell trait (H[facility])     Allergies: No Known Allergies Medications:  Current Outpatient Medications:    albuterol (VENTOLIN HFA) 108 (90 Base) MCG/ACT inhaler, Inhale 1-2 puffs into the lungs every 6 (six) hours as needed for wheezing or shortness of breath., Disp: 6.7 g, Rfl: 0   cyclobenzaprine (FLEXERIL) 10 MG tablet, Take 0.5-1 tablets (5-10 mg total) by mouth 3 (three) times daily as needed for muscle spasms., Disp: 30 tablet, Rfl: 5   escitalopram (LEXAPRO) 10 MG tablet, Take 10 mg by mouth daily., Disp: , Rfl:    hydrOXYzine (ATARAX) 50 MG tablet, Take 100 mg by mouth at bedtime., Disp: , Rfl:    ibuprofen (ADVIL) 600 MG tablet, Take 1 tablet (600 mg total) by mouth every 8 (eight) hours as needed., Disp: 30 tablet, Rfl: 5   lamoTRIgine (LAMICTAL) 25 MG tablet, Take 50 mg by mouth at bedtime., Disp: , Rfl:    predniSONE (STERAPRED UNI-PAK 21 TAB) 10 MG (21) TBPK tablet, Take 6 tablets on day one, 5 on day two, 4 on day three, 3 on day four, 2 on day five, and 1 on day six. Take with food., Disp: 21 tablet, Rfl: 0   promethazine (PHENERGAN) 25 MG tablet, Take 1 tablet (25 mg total) by mouth every 8 (eight) hours as needed for nausea or vomiting., Disp: 15 tablet, Rfl: 2   Rimegepant Sulfate (NURTEC) 75 MG TBDP, Take 1 tablet by mouth once as needed for up to 1 dose., Disp: 10 tablet, Rfl: 2  Observations/Objective: Patient is well-developed, well-nourished in no acute distress.  Resting comfortably  at home.  Head is normocephalic, atraumatic.  No labored breathing.  Speech is clear and coherent with logical content.  Patient is alert and oriented at baseline.     Assessment and Plan: Sciatica of right side  Advised follow up with PCP next week as scheduled to discuss imaging  These episodes have been recurrent since 2019 when she was pregnant   1. Sciatica of right side  - cyclobenzaprine (FLEXERIL) 10 MG tablet; Take 1 tablet (10 mg total) by mouth 3 (three) times daily as needed for up to 5 days for muscle spasms.  Dispense: 15 tablet; Refill: 0    Discussed emergent symptoms and reasons to report to ED while awaiting follow up with PCP: weakness increasing numbness uncontrolled pain. Loss of bowel or bladder control.   May alternate tylenol and ibuprofen as discussed    Follow Up Instructions: I discussed the assessment and treatment plan with the patient. The patient was provided an opportunity to ask questions and all were answered. The patient agreed with the plan and demonstrated an understanding of the instructions.  A copy of instructions  were sent to the patient via MyChart unless otherwise noted below.    The patient was advised to call back or seek an in-person evaluation if the symptoms worsen or if the condition fails to improve as anticipated.  Time:  I spent 7 minutes with the patient via telehealth technology discussing the above problems/concerns.    Apolonio Schneiders, FNP

## 2023-01-22 ENCOUNTER — Ambulatory Visit (INDEPENDENT_AMBULATORY_CARE_PROVIDER_SITE_OTHER): Payer: BC Managed Care – PPO | Admitting: Family Medicine

## 2023-01-22 ENCOUNTER — Encounter: Payer: Self-pay | Admitting: Family Medicine

## 2023-01-22 VITALS — BP 104/80 | HR 117 | Temp 99.8°F | Ht 68.5 in | Wt 233.8 lb

## 2023-01-22 DIAGNOSIS — M5431 Sciatica, right side: Secondary | ICD-10-CM | POA: Diagnosis not present

## 2023-01-22 DIAGNOSIS — M5416 Radiculopathy, lumbar region: Secondary | ICD-10-CM

## 2023-01-22 MED ORDER — GABAPENTIN 300 MG PO CAPS: 300.0000 mg | ORAL_CAPSULE | Freq: Every day | ORAL | 0 refills | Status: DC

## 2023-01-22 MED ORDER — PREDNISONE 20 MG PO TABS: ORAL_TABLET | ORAL | 0 refills | Status: AC

## 2023-01-22 MED ORDER — KETOROLAC TROMETHAMINE 60 MG/2ML IM SOLN
60.0000 mg | Freq: Once | INTRAMUSCULAR | Status: AC
  Administered 2023-01-22: 60 mg via INTRAMUSCULAR

## 2023-01-22 NOTE — Progress Notes (Unsigned)
   Established Patient Office Visit  Subjective   Patient ID: Kristen Haney, female    DOB: June 01, 1991  Age: 32 y.o. MRN: [id number]  Chief Complaint  Patient presents with   Back Pain    Patient complains of right buttock pain since 2019, worse when standing, no known injury, tried Flexeril with no relief    Patient has been seen for this in the past, has had recurrent symptoms despite treatment with NSAIDS and steroid dose pak.   Back Pain This is a recurrent problem. The current episode started 1 to 4 weeks ago. The problem occurs constantly. The problem is unchanged. The pain is present in the lumbar spine and gluteal. The quality of the pain is described as shooting. The pain radiates to the right thigh. The pain is moderate. The pain is Worse during the night. The symptoms are aggravated by lying down and standing. Stiffness is present All day. She has tried NSAIDs, analgesics, ice and muscle relaxant for the symptoms. The treatment provided no relief.      Review of Systems  Musculoskeletal:  Positive for back pain.      Objective:     BP 104/80 (BP Location: Left Arm, Patient Position: Sitting, Cuff Size: Large)   Pulse (!) 117   Temp 99.8 F (37.7 C) (Oral)   Ht 5' 8.5" (1.74 m)   Wt 233 lb 12.8 oz (106.1 kg)   LMP 12/25/2022   SpO2 97%   BMI 35.03 kg/m    Physical Exam Constitutional:      Appearance: Normal appearance.  Eyes:     Conjunctiva/sclera: Conjunctivae normal.  Pulmonary:     Effort: Pulmonary effort is normal.  Musculoskeletal:        General: Normal range of motion.  Neurological:     General: No focal deficit present.     Mental Status: She is alert and oriented to person, place, and time.  Psychiatric:        Mood and Affect: Mood normal.        Behavior: Behavior normal.      No results found for any visits on 01/22/23.    The ASCVD Risk score (Arnett DK, et al., 2019) failed to calculate for the following reasons:   The 2019  ASCVD risk score is only valid for ages 66 to 55    Assessment & Plan:   Problem List Items Addressed This Visit   None Visit Diagnoses     Sciatica of right side    -  Primary   Relevant Medications   predniSONE (DELTASONE) 20 MG tablet   ketorolac (TORADOL) injection 60 mg   gabapentin (NEURONTIN) 300 MG capsule   Lumbar radiculopathy       Relevant Medications   Persistent/ recurrent symptoms. I will treat with a longer steroid taper, starting with 60 mg prednisone and tapering by 20 mg every 3 days until gone. Ketorolac 60 mg IM injection given in the office today for immediate relief. Will also start patient on gabapentin 300 mg daily at bedtime. I will order MRI of the lumbar spine and refer patient to [facility] for further recommendations and treatment.  gabapentin (NEURONTIN) 300 MG capsule   Other Relevant Orders   MR Lumbar Spine Wo Contrast   Ambulatory referral to Pain Clinic       No follow-ups on file.    Farrel Conners, MD

## 2023-01-23 ENCOUNTER — Telehealth: Payer: Self-pay | Admitting: *Deleted

## 2023-01-23 NOTE — Telephone Encounter (Signed)
Message sent to the referral coordinator as [facility] ([place] office) sent a fax requesting office notes be sent to their office.  Fax stated to call [phone] opt#2 with any questions.

## 2023-01-31 ENCOUNTER — Telehealth: Payer: BC Managed Care – PPO | Admitting: Family Medicine

## 2023-01-31 ENCOUNTER — Encounter: Payer: Self-pay | Admitting: Family Medicine

## 2023-01-31 ENCOUNTER — Telehealth: Payer: Self-pay | Admitting: Family Medicine

## 2023-01-31 DIAGNOSIS — M5416 Radiculopathy, lumbar region: Secondary | ICD-10-CM

## 2023-01-31 NOTE — Telephone Encounter (Signed)
Pt is sch for mri of back tomorrow and [facility] will order pt some physical therapy it has not be set up yet. [facility] today. Pt works at [employer] and stands of her feet 4 hrs  a day and would like a work note with restrictions to only one day per week until march 19,2024 .This will give pt time to have PT and will see [facility] on 03-14-2023. Please put work note on her mychart. Pt has also sent mychart message

## 2023-01-31 NOTE — Progress Notes (Signed)
[place]  Needs work restrictions from PCP or Ortho Provider

## 2023-02-01 ENCOUNTER — Ambulatory Visit (HOSPITAL_BASED_OUTPATIENT_CLINIC_OR_DEPARTMENT_OTHER)
Admission: RE | Admit: 2023-02-01 | Discharge: 2023-02-01 | Disposition: A | Discharge status: Home / Self Care | Payer: BC Managed Care – PPO | Source: Ambulatory Visit | Attending: Family Medicine | Admitting: Family Medicine

## 2023-02-01 ENCOUNTER — Encounter: Payer: Self-pay | Admitting: Family Medicine

## 2023-02-01 DIAGNOSIS — M5416 Radiculopathy, lumbar region: Secondary | ICD-10-CM

## 2023-02-01 DIAGNOSIS — M5126 Other intervertebral disc displacement, lumbar region: Secondary | ICD-10-CM | POA: Diagnosis not present

## 2023-02-01 NOTE — Telephone Encounter (Signed)
Ok to write letter for patient

## 2023-02-01 NOTE — Telephone Encounter (Signed)
Letter completed and sent via Mychart. 

## 2023-02-02 ENCOUNTER — Other Ambulatory Visit: Payer: Self-pay | Admitting: Family Medicine

## 2023-02-02 DIAGNOSIS — G43019 Migraine without aura, intractable, without status migrainosus: Secondary | ICD-10-CM

## 2023-02-02 DIAGNOSIS — L309 Dermatitis, unspecified: Secondary | ICD-10-CM

## 2023-02-04 NOTE — Telephone Encounter (Signed)
Is this a duplicate?

## 2023-02-10 ENCOUNTER — Emergency Department
Admission: EM | Admit: 2023-02-10 | Discharge: 2023-02-10 | Disposition: A | Discharge status: Home / Self Care | Payer: BC Managed Care – PPO | Attending: Emergency Medicine | Admitting: Emergency Medicine

## 2023-02-10 ENCOUNTER — Emergency Department: Payer: BC Managed Care – PPO

## 2023-02-10 ENCOUNTER — Other Ambulatory Visit: Payer: Self-pay

## 2023-02-10 DIAGNOSIS — S0990XA Unspecified injury of head, initial encounter: Secondary | ICD-10-CM | POA: Diagnosis not present

## 2023-02-10 DIAGNOSIS — S161XXA Strain of muscle, fascia and tendon at neck level, initial encounter: Secondary | ICD-10-CM | POA: Diagnosis not present

## 2023-02-10 DIAGNOSIS — S39012A Strain of muscle, fascia and tendon of lower back, initial encounter: Secondary | ICD-10-CM

## 2023-02-10 DIAGNOSIS — M542 Cervicalgia: Secondary | ICD-10-CM | POA: Diagnosis not present

## 2023-02-10 DIAGNOSIS — R519 Headache, unspecified: Secondary | ICD-10-CM | POA: Insufficient documentation

## 2023-02-10 DIAGNOSIS — Y92410 Unspecified street and highway as the place of occurrence of the external cause: Secondary | ICD-10-CM | POA: Insufficient documentation

## 2023-02-10 DIAGNOSIS — M545 Low back pain, unspecified: Secondary | ICD-10-CM | POA: Diagnosis not present

## 2023-02-10 LAB — POC URINE PREG, ED: Preg Test, Ur: NEGATIVE

## 2023-02-10 NOTE — ED Notes (Signed)
RX informed of negative POC

## 2023-02-10 NOTE — Discharge Instructions (Addendum)
600 mg of ibuprofen (Advil, Motrin) up to every 6 hours (with food in your stomach) as needed for pain.  You may take the muscle relaxant you have at home.  Return to the ER for new, worsening, or persistent severe neck or back pain, weakness or numbness in the arms or legs, difficulty walking, severe headache, vomiting, or any other new or worsening symptoms that concern you.
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
## 2023-02-10 NOTE — ED Notes (Signed)
Patient verbalized understanding of discharge instructions.  ?
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
## 2023-02-14 ENCOUNTER — Encounter: Payer: Self-pay | Admitting: Family Medicine

## 2023-02-14 DIAGNOSIS — F332 Major depressive disorder, recurrent severe without psychotic features: Secondary | ICD-10-CM

## 2023-02-15 MED ORDER — HYDROXYZINE HCL 50 MG PO TABS: 100.0000 mg | ORAL_TABLET | Freq: Every day | ORAL | 1 refills | Status: AC

## 2023-02-21 ENCOUNTER — Telehealth: Payer: BC Managed Care – PPO | Admitting: Physician Assistant

## 2023-02-21 DIAGNOSIS — L0292 Furuncle, unspecified: Secondary | ICD-10-CM | POA: Diagnosis not present

## 2023-02-21 MED ORDER — SULFAMETHOXAZOLE-TRIMETHOPRIM 800-160 MG PO TABS: 1.0000 | ORAL_TABLET | Freq: Two times a day (BID) | ORAL | 0 refills | Status: DC

## 2023-02-21 NOTE — Progress Notes (Signed)
   We are sorry that you are not feeling well. Here is how we plan to help!  Based on what you shared with me it looks like you have an abscess/boil.   Cellulitis looks like areas of skin redness, swelling, and warmth; it develops as a result of bacteria entering under the skin. I have prescribed:  Bactrim DS 1 tablet by mouth twice a day for 7 days  HOME CARE:  Take your medications as ordered and take all of them, even if the skin irritation appears to be healing.   GET HELP RIGHT AWAY IF:  Symptoms that don't begin to go away within 48 hours. Severe redness persists or worsens If the area turns color, spreads or swells. If it blisters and opens, develops yellow-brown crust or bleeds. You develop a fever or chills. If the pain increases or becomes unbearable.  Are unable to keep fluids and food down.  MAKE SURE YOU   Understand these instructions. Will watch your condition. Will get help right away if you are not doing well or get worse.  Thank you for choosing an e-visit.  Your e-visit answers were reviewed by a board certified advanced clinical practitioner to complete your personal care plan. Depending upon the condition, your plan could have included both over the counter or prescription medications.  Please review your pharmacy choice. Make sure the pharmacy is open so you can pick up prescription now. If there is a problem, you may contact your provider through [employer] and have the prescription routed to another pharmacy.  Your safety is important to [place]. If you have drug allergies check your prescription carefully.   For the next 24 hours you can use MyChart to ask questions about today's visit, request a non-urgent call back, or ask for a work or school excuse. You will get an email in the next two days asking about your experience. I hope that your e-visit has been valuable and will speed your recovery.

## 2023-02-21 NOTE — Progress Notes (Signed)
I have spent 5 minutes in review of e-visit questionnaire, review and updating patient chart, medical decision making and response to patient.   Rielyn Krupinski Cody Author [place], PA-C    

## 2023-02-27 ENCOUNTER — Encounter: Payer: Self-pay | Admitting: Family Medicine

## 2023-02-27 ENCOUNTER — Ambulatory Visit (INDEPENDENT_AMBULATORY_CARE_PROVIDER_SITE_OTHER): Payer: BC Managed Care – PPO | Admitting: Family Medicine

## 2023-02-27 VITALS — BP 102/78 | HR 75 | Temp 98.4°F | Ht 68.0 in | Wt 236.5 lb

## 2023-02-27 DIAGNOSIS — R059 Cough, unspecified: Secondary | ICD-10-CM

## 2023-02-27 DIAGNOSIS — G43801 Other migraine, not intractable, with status migrainosus: Secondary | ICD-10-CM

## 2023-02-27 DIAGNOSIS — J029 Acute pharyngitis, unspecified: Secondary | ICD-10-CM | POA: Diagnosis not present

## 2023-02-27 LAB — POCT INFLUENZA A/B
Influenza A, POC: NEGATIVE
Influenza B, POC: NEGATIVE

## 2023-02-27 LAB — POC COVID19 BINAXNOW: SARS Coronavirus 2 Ag: NEGATIVE

## 2023-02-27 LAB — POCT RAPID STREP A (OFFICE): Rapid Strep A Screen: POSITIVE — AB

## 2023-02-27 MED ORDER — KETOROLAC TROMETHAMINE 60 MG/2ML IM SOLN
60.0000 mg | Freq: Once | INTRAMUSCULAR | Status: AC
  Administered 2023-02-27: 60 mg via INTRAMUSCULAR

## 2023-02-27 MED ORDER — CEFDINIR 300 MG PO CAPS: 300.0000 mg | ORAL_CAPSULE | Freq: Two times a day (BID) | ORAL | 0 refills | Status: AC

## 2023-02-27 NOTE — Progress Notes (Signed)
Acute Office Visit  Subjective:     Patient ID: Kristen Haney, female    DOB: [birth year], 32 y.o.   MRN: [id number]  Chief Complaint  Patient presents with   Headache    X2 days, tried migraine medication and Ibuprofen with no relief and states her son tested positive for Covid   Nasal Congestion    X2 days    Headache    Patient is in today for acute migraine, states she is having fever and chills subjectively at home. States that her son is positive for COVID at home but her testing today is negative. Some nasal congestion and mucus production. States that she is having to use her asthma medication is only having some minor coughing.   Review of Systems  Neurological:  Positive for headaches.        Objective:    BP 102/78 (BP Location: Left Arm, Patient Position: Sitting, Cuff Size: Large)   Pulse 75   Temp 98.4 F (36.9 C) (Oral)   Ht '5\' 8"'$  (1.727 m)   Wt 236 lb 8 oz (107.3 kg)   LMP 02/24/2023 (Approximate)   SpO2 99%   BMI 35.96 kg/m     Physical Exam Constitutional:      Appearance: She is well-developed. She is obese.  HENT:     Right Ear: Tympanic membrane normal.     Left Ear: Tympanic membrane normal.     Mouth/Throat:     Mouth: Mucous membranes are moist.     Pharynx: Posterior oropharyngeal erythema present.     Tonsils: No tonsillar exudate. 2+ on the right. 2+ on the left.  Eyes:     Pupils: Pupils are equal, round, and reactive to light.  Cardiovascular:     Rate and Rhythm: Normal rate and regular rhythm.     Heart sounds: Normal heart sounds.  Pulmonary:     Effort: Pulmonary effort is normal.     Breath sounds: Normal breath sounds. No wheezing.  Abdominal:     General: Bowel sounds are normal.  Lymphadenopathy:     Cervical: No cervical adenopathy.  Neurological:     Mental Status: She is alert.     Results for orders placed or performed in visit on 02/27/23  POC COVID-19  Result Value Ref Range   SARS Coronavirus 2 Ag  Negative Negative  POC Influenza A/B  Result Value Ref Range   Influenza A, POC Negative Negative   Influenza B, POC Negative Negative  POC Rapid Strep A  Result Value Ref Range   Rapid Strep A Screen Positive (A) Negative        Assessment & Plan:   Problem List Items Addressed This Visit   None Visit Diagnoses     Cough, unspecified type    -  Primary   Relevant Orders   POC COVID-19 (Completed)   POC Influenza A/B (Completed)   POC Rapid Strep A (Completed)   Other migraine with status migrainosus, not intractable       Relevant Medications   ketorolac (TORADOL) injection 60 mg (Completed)   Sore throat       Relevant Medications   cefdinir (OMNICEF) 300 MG capsule     Patient continues to be positive for strep despite treatment with antibiotics (just finished a course of bactrim DS for her hydradenitis). I think she might be colonized with strep, her tonsil were erythematous and somewhat enlarged. I recommended that if she has another positive test that I  would refer her to ENT or possible need for tonsilectomy. I advised patient to retest herself of COVID on Friday and if positive she should call the office for a video visit to get paxlovid. I Ordered injection of ketorolac for the migraine pain and recommended she continues OTC sinus medication to reduce pressure/pain in the sinuses.   Meds ordered this encounter  Medications   ketorolac (TORADOL) injection 60 mg   cefdinir (OMNICEF) 300 MG capsule    Sig: Take 1 capsule (300 mg total) by mouth 2 (two) times daily for 10 days.    Dispense:  20 capsule    Refill:  0    No follow-ups on file.  Farrel Conners, MD

## 2023-03-01 ENCOUNTER — Ambulatory Visit (HOSPITAL_BASED_OUTPATIENT_CLINIC_OR_DEPARTMENT_OTHER): Payer: BC Managed Care – PPO

## 2023-03-12 ENCOUNTER — Encounter: Payer: Self-pay | Admitting: Family Medicine

## 2023-03-12 DIAGNOSIS — G8929 Other chronic pain: Secondary | ICD-10-CM

## 2023-03-12 DIAGNOSIS — F332 Major depressive disorder, recurrent severe without psychotic features: Secondary | ICD-10-CM

## 2023-03-12 MED ORDER — CYCLOBENZAPRINE HCL 10 MG PO TABS: 5.0000 mg | ORAL_TABLET | Freq: Three times a day (TID) | ORAL | 5 refills | Status: DC | PRN

## 2023-03-14 ENCOUNTER — Ambulatory Visit: Payer: BC Managed Care – PPO | Admitting: Family Medicine

## 2023-03-14 ENCOUNTER — Telehealth: Payer: Self-pay | Admitting: Family Medicine

## 2023-03-14 NOTE — Telephone Encounter (Signed)
Given to Dr Legrand Como.

## 2023-03-14 NOTE — Telephone Encounter (Signed)
PCP Provider Letter form to be filled out. Placed in doctors folder. Please call patient to pick up once complete.

## 2023-03-15 ENCOUNTER — Encounter: Payer: Self-pay | Admitting: Family Medicine

## 2023-03-15 MED ORDER — HYDROXYZINE PAMOATE 25 MG PO CAPS: 25.0000 mg | ORAL_CAPSULE | Freq: Three times a day (TID) | ORAL | 0 refills | Status: DC | PRN

## 2023-03-15 NOTE — Telephone Encounter (Signed)
Patient is aware form is completed and ready to be picked up at front desk.

## 2023-03-15 NOTE — Telephone Encounter (Signed)
Pt is calling and would like to pick up the form today

## 2023-03-15 NOTE — Addendum Note (Signed)
Addended by: Farrel Conners on: 03/15/2023 02:01 PM   Modules accepted: Orders

## 2023-03-15 NOTE — Telephone Encounter (Signed)
Please advise if form is ready to be picked up today

## 2023-03-21 ENCOUNTER — Ambulatory Visit: Payer: BC Managed Care – PPO | Admitting: Family Medicine

## 2023-03-27 ENCOUNTER — Telehealth: Payer: Self-pay | Admitting: Family Medicine

## 2023-03-27 ENCOUNTER — Encounter: Payer: Self-pay | Admitting: Family Medicine

## 2023-03-27 NOTE — Telephone Encounter (Signed)
Pt does not want to go to [facility] [place] [facility] due to copay and would like to go somewhere else. Pt will have new insurance auxiant a part of [employer]. Pt ahs 2 boils in armpits

## 2023-04-01 NOTE — Telephone Encounter (Signed)
Is there anywhere else we can send her? I do not know who would have a co pay and who would not.Marland KitchenMarland Kitchen

## 2023-04-02 ENCOUNTER — Other Ambulatory Visit: Payer: Self-pay | Admitting: Family Medicine

## 2023-04-02 DIAGNOSIS — G43019 Migraine without aura, intractable, without status migrainosus: Secondary | ICD-10-CM

## 2023-04-02 NOTE — Telephone Encounter (Signed)
Spoke with the patient and informed her of the message below.  

## 2023-04-03 ENCOUNTER — Encounter: Payer: Self-pay | Admitting: Family Medicine

## 2023-04-03 ENCOUNTER — Ambulatory Visit (INDEPENDENT_AMBULATORY_CARE_PROVIDER_SITE_OTHER): Payer: PRIVATE HEALTH INSURANCE | Admitting: Family Medicine

## 2023-04-03 VITALS — BP 110/70 | HR 100 | Temp 99.1°F | Ht 68.0 in | Wt 248.5 lb

## 2023-04-03 DIAGNOSIS — G43801 Other migraine, not intractable, with status migrainosus: Secondary | ICD-10-CM | POA: Diagnosis not present

## 2023-04-03 DIAGNOSIS — J020 Streptococcal pharyngitis: Secondary | ICD-10-CM | POA: Diagnosis not present

## 2023-04-03 DIAGNOSIS — L732 Hidradenitis suppurativa: Secondary | ICD-10-CM

## 2023-04-03 DIAGNOSIS — F332 Major depressive disorder, recurrent severe without psychotic features: Secondary | ICD-10-CM | POA: Diagnosis not present

## 2023-04-03 LAB — POCT RAPID STREP A (OFFICE): Rapid Strep A Screen: POSITIVE — AB

## 2023-04-03 MED ORDER — BENZOYL PEROXIDE 10 % EX LIQD: 1.0000 | Freq: Every day | CUTANEOUS | 5 refills | Status: AC

## 2023-04-03 MED ORDER — SULFAMETHOXAZOLE-TRIMETHOPRIM 800-160 MG PO TABS: 1.0000 | ORAL_TABLET | Freq: Two times a day (BID) | ORAL | 0 refills | Status: DC

## 2023-04-03 MED ORDER — GABAPENTIN 300 MG PO CAPS: 300.0000 mg | ORAL_CAPSULE | Freq: Two times a day (BID) | ORAL | 0 refills | Status: DC

## 2023-04-03 MED ORDER — CLINDAMYCIN PHOS-BENZOYL PEROX 1-5 % EX GEL: Freq: Two times a day (BID) | CUTANEOUS | 5 refills | Status: DC

## 2023-04-03 MED ORDER — BUPROPION HCL ER (XL) 150 MG PO TB24: 150.0000 mg | ORAL_TABLET | Freq: Every day | ORAL | 1 refills | Status: DC

## 2023-04-03 NOTE — Assessment & Plan Note (Addendum)
Mood is stable today, she stopped her lexapro due to excessive weight gain. We discussed switching her to wellbutrin 150 mg daily, this will help with her mood sx and also help reduce appetite. I will see her back in 6 months for follow up or sooner is needed.

## 2023-04-03 NOTE — Progress Notes (Signed)
Established Patient Office Visit  Subjective   Patient ID: Kristen Haney, female    DOB: Mar 24, 1991  Age: 32 y.o. MRN: [id number]  Chief Complaint  Patient presents with   Abscess    Left axilla x3 weeks   Headache    Recurrent x5 days, questioned if due to discontinuing caffeine or allergies, used Nurtec with no relief    Pt is here for her hydradenitis flare up and other complaints. She reports she has 1 area in the left axilla that is swollen, red, and very painful to touch, states that it is more linear than her other episodes, no open areas or drainage. She denies any fever or chills.  She is also reporting a scratchy throat, states that she finished the antibiotics I gave her in February, but the sore throat has returned. Strep test is positive again. I advised referral to ENT for recurrent strep-- possibly needs tonsilectomy.    Pt also reports that she stopped the lexapro due to excessive weight gain, states that she is getting stretch marks on her sides and her skin is itching a lot. States that she also stopped drinking caffeine and it caused a migraine, has had the migraine for the last 7 days.   Abscess Associated symptoms include headaches.  Headache       Review of Systems  Neurological:  Positive for headaches.  All other systems reviewed and are negative.     Objective:     BP 110/70 (BP Location: Left Arm, Patient Position: Sitting, Cuff Size: Large)   Pulse 100   Temp 99.1 F (37.3 C) (Oral)   Ht 5\' 8"  (1.727 m)   Wt 248 lb 8 oz (112.7 kg)   LMP 03/28/2023 (Exact Date)   SpO2 99%   BMI 37.78 kg/m    Physical Exam Vitals reviewed.  Constitutional:      Appearance: She is well-developed. She is obese.  HENT:     Mouth/Throat:     Mouth: Mucous membranes are moist.     Pharynx: Posterior oropharyngeal erythema present.  Pulmonary:     Effort: Pulmonary effort is normal.  Musculoskeletal:     Cervical back: Normal range of motion.  Skin:     Findings: Lesion (left axilla there is a 2 cm linear area of redness, severe tenderness to palpation and edema. there is no underlying abscess that we can drain in office today) present.  Neurological:     Mental Status: She is alert.  Psychiatric:        Mood and Affect: Mood normal.        Speech: Speech normal.        Behavior: Behavior normal.      Results for orders placed or performed in visit on 04/03/23  POC Rapid Strep A  Result Value Ref Range   Rapid Strep A Screen Positive (A) Negative      The ASCVD Risk score (Arnett DK, et al., 2019) failed to calculate for the following reasons:   The 2019 ASCVD risk score is only valid for ages 81 to 70    Assessment & Plan:   Problem List Items Addressed This Visit       Unprioritized   Major depressive disorder, recurrent severe without psychotic features    Mood is stable today, she stopped her lexapro due to excessive weight gain. We discussed switching her to wellbutrin 150 mg daily, this will help with her mood sx and also help reduce appetite. I  will see her back in 6 months for follow up or sooner is needed.       Relevant Medications   buPROPion (WELLBUTRIN XL) 150 MG 24 hr tablet   Hidradenitis suppurativa - Primary (Chronic)    Was not able to see the general surgeon due to high co pay, pt is looking to see who is in network with her insurance and will call us to put in a new general surgery referral. Will treat today with another course of bactrim DS 1 tablet BID for 7 days. I also prescribed a benzoyl peroxide skin regimen to help reduce the recurrence of the flare ups.       Relevant Medications   benzoyl peroxide 10 % LIQD   clindamycin-benzoyl peroxide (BENZACLIN) gel   sulfamethoxazole-trimethoprim (BACTRIM DS) 800-160 MG tablet   Other Visit Diagnoses     Recurrent streptococcal pharyngitis       3rd positive strep test in the last 6 months, will send to ENT for possible need for tonsillectomy.    Relevant Medications   sulfamethoxazole-trimethoprim (BACTRIM DS) 800-160 MG tablet   Other Relevant Orders   Ambulatory referral to ENT   POC Rapid Strep A (Completed)   Other migraine with status migrainosus, not intractable       will treat with gabapentin 300 mg BID since she already has a prescription for this. this would be helpful in reduing the number of migraines per month.   Relevant Medications   buPROPion (WELLBUTRIN XL) 150 MG 24 hr tablet   gabapentin (NEURONTIN) 300 MG capsule       Return in about 6 months (around 10/03/2023).    Farrel Conners, MD

## 2023-04-03 NOTE — Assessment & Plan Note (Signed)
Was not able to see the general surgeon due to high co pay, pt is looking to see who is in network with her insurance and will call us to put in a new general surgery referral. Will treat today with another course of bactrim DS 1 tablet BID for 7 days. I also prescribed a benzoyl peroxide skin regimen to help reduce the recurrence of the flare ups.

## 2023-04-04 ENCOUNTER — Telehealth: Payer: Self-pay | Admitting: Family Medicine

## 2023-04-04 NOTE — Telephone Encounter (Signed)
FYI Called pt, left msg for pt to call back.

## 2023-04-15 ENCOUNTER — Encounter: Payer: Self-pay | Admitting: *Deleted

## 2023-04-24 ENCOUNTER — Encounter: Payer: Self-pay | Admitting: Family Medicine

## 2023-04-24 ENCOUNTER — Telehealth: Payer: PRIVATE HEALTH INSURANCE | Admitting: Nurse Practitioner

## 2023-04-24 DIAGNOSIS — F419 Anxiety disorder, unspecified: Secondary | ICD-10-CM | POA: Insufficient documentation

## 2023-04-24 DIAGNOSIS — F319 Bipolar disorder, unspecified: Secondary | ICD-10-CM | POA: Insufficient documentation

## 2023-04-24 DIAGNOSIS — F520 Hypoactive sexual desire disorder: Secondary | ICD-10-CM | POA: Diagnosis not present
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
## 2023-04-24 NOTE — Patient Instructions (Addendum)
[email or web address]  Connect With [place] [address] [place], [place] [address] HelpLine: [phone] or [phone]

## 2023-04-25 ENCOUNTER — Telehealth: Payer: PRIVATE HEALTH INSURANCE | Admitting: Family Medicine

## 2023-05-02 ENCOUNTER — Ambulatory Visit: Payer: PRIVATE HEALTH INSURANCE | Admitting: Family Medicine

## 2023-05-03 ENCOUNTER — Other Ambulatory Visit: Payer: Self-pay | Admitting: Family Medicine

## 2023-05-03 DIAGNOSIS — G43801 Other migraine, not intractable, with status migrainosus: Secondary | ICD-10-CM

## 2023-05-09 ENCOUNTER — Telehealth: Payer: Self-pay

## 2023-05-09 NOTE — Telephone Encounter (Signed)
Tried calling [employer] at 2 PRIOR AUTH CALL [phone] to initiate PA- after holding for 15 mins, hung up.   KEY: [id number]

## 2023-05-10 ENCOUNTER — Ambulatory Visit: Payer: PRIVATE HEALTH INSURANCE | Admitting: Family Medicine

## 2023-05-13 ENCOUNTER — Telehealth: Payer: Self-pay

## 2023-05-13 ENCOUNTER — Other Ambulatory Visit (HOSPITAL_COMMUNITY): Payer: Self-pay

## 2023-05-13 NOTE — Telephone Encounter (Signed)
PA submitted via [employer] website. Will be updated in additional encounter

## 2023-05-13 NOTE — Telephone Encounter (Signed)
PA request received for Clindamycin Phos-Benzoyl Perox 1-5% gel via provider/CMM  [employer] was called and was directed to website in order to do electronic PA  PA submitted via [email or web address] and is pending determination, should take 24-48 hours per website.

## 2023-05-16 ENCOUNTER — Ambulatory Visit: Payer: PRIVATE HEALTH INSURANCE | Admitting: Dermatology

## 2023-05-17 ENCOUNTER — Ambulatory Visit: Payer: PRIVATE HEALTH INSURANCE | Admitting: Family Medicine

## 2023-05-22 ENCOUNTER — Other Ambulatory Visit (HOSPITAL_COMMUNITY): Payer: Self-pay

## 2023-05-22 NOTE — Telephone Encounter (Signed)
PA approved.

## 2023-05-22 NOTE — Telephone Encounter (Signed)
I called [facility] and informed April of the approval as below.

## 2023-05-24 ENCOUNTER — Encounter: Payer: Self-pay | Admitting: Family Medicine

## 2023-05-24 ENCOUNTER — Ambulatory Visit: Payer: PRIVATE HEALTH INSURANCE | Admitting: Family Medicine

## 2023-05-24 ENCOUNTER — Ambulatory Visit (INDEPENDENT_AMBULATORY_CARE_PROVIDER_SITE_OTHER): Payer: PRIVATE HEALTH INSURANCE | Admitting: Family Medicine

## 2023-05-24 VITALS — BP 130/80 | HR 87 | Temp 99.6°F | Ht 68.0 in | Wt 254.1 lb

## 2023-05-24 DIAGNOSIS — F332 Major depressive disorder, recurrent severe without psychotic features: Secondary | ICD-10-CM | POA: Diagnosis not present

## 2023-05-24 DIAGNOSIS — F529 Unspecified sexual dysfunction not due to a substance or known physiological condition: Secondary | ICD-10-CM

## 2023-05-24 MED ORDER — BUPROPION HCL ER (XL) 300 MG PO TB24
300.0000 mg | ORAL_TABLET | Freq: Every day | ORAL | 1 refills | Status: DC
Start: 2023-05-24 — End: 2024-05-08

## 2023-05-24 MED ORDER — SILDENAFIL CITRATE 50 MG PO TABS
25.0000 mg | ORAL_TABLET | Freq: Every day | ORAL | 0 refills | Status: DC | PRN
Start: 2023-05-24 — End: 2023-06-11
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
## 2023-05-30 NOTE — Assessment & Plan Note (Signed)
Will increase wellbutrin to 300 mg daily to help with the depressive symptoms while not interfering with libido. Will see her back in 6 months for follow up

## 2023-06-11 ENCOUNTER — Other Ambulatory Visit: Payer: Self-pay | Admitting: Family Medicine

## 2023-06-11 DIAGNOSIS — F529 Unspecified sexual dysfunction not due to a substance or known physiological condition: Secondary | ICD-10-CM

## 2023-06-11 MED ORDER — SILDENAFIL CITRATE 50 MG PO TABS
25.0000 mg | ORAL_TABLET | Freq: Every day | ORAL | 5 refills | Status: AC | PRN
Start: 2023-06-11 — End: ?

## 2023-06-20 ENCOUNTER — Ambulatory Visit (INDEPENDENT_AMBULATORY_CARE_PROVIDER_SITE_OTHER): Payer: PRIVATE HEALTH INSURANCE | Admitting: Family Medicine

## 2023-06-20 ENCOUNTER — Encounter: Payer: Self-pay | Admitting: Family Medicine

## 2023-06-20 VITALS — BP 112/70 | HR 105 | Temp 98.9°F | Ht 68.0 in | Wt 250.3 lb

## 2023-06-20 DIAGNOSIS — B372 Candidiasis of skin and nail: Secondary | ICD-10-CM

## 2023-06-20 MED ORDER — NYSTATIN 100000 UNIT/GM EX CREA
1.0000 | TOPICAL_CREAM | Freq: Two times a day (BID) | CUTANEOUS | 0 refills | Status: DC
Start: 2023-06-20 — End: 2023-07-10

## 2023-06-20 NOTE — Progress Notes (Signed)
   Acute Office Visit  Subjective:     Patient ID: Kristen Haney, female    DOB: [birth year], 32 y.o.   MRN: [id number]  Chief Complaint  Patient presents with   Breast Pain    Patient complains of breast pain, x1 month   Nevus    HPI Patient is in today for patient is reporting a rash for about a month, states that it is very red, states that it hurts/ burns, especially when she is wearing a bra. No fever/chils, states that it is no her hydradenitis -- it looks differently. The red spots are smaller and scattered.   Pt also has a small mole on the center of her upper chest that she would like removed. States that it is catching on her necklaces and it is causing her a lot of pain, states that it will bleed if it gets caught on her necklace.   Review of Systems  All other systems reviewed and are negative.       Objective:    BP 112/70 (BP Location: Left Arm, Patient Position: Sitting, Cuff Size: Large)   Pulse (!) 105   Temp 98.9 F (37.2 C) (Oral)   Ht 5\' 8"  (1.727 m)   Wt 250 lb 4.8 oz (113.5 kg)   LMP 04/27/2023 (Exact Date)   SpO2 100%   BMI 38.06 kg/m    Physical Exam Constitutional:      Appearance: Normal appearance. She is obese.  Eyes:     Conjunctiva/sclera: Conjunctivae normal.  Pulmonary:     Effort: Pulmonary effort is normal.  Skin:    Findings: Rash (under left breast there is a confluent raised rash with satellite lesions) present.  Neurological:     Mental Status: She is alert and oriented to person, place, and time.  Psychiatric:        Mood and Affect: Mood normal.        Behavior: Behavior normal.     No results found for any visits on 06/20/23.      Assessment & Plan:   Problem List Items Addressed This Visit   None Visit Diagnoses     Candidal intertrigo    -  Primary   Relevant Medications   nystatin cream (MYCOSTATIN)     Red raised rash on exam under the left breast, with satellite lesions, will treat with nystatin cream.  Pt advised to keep this area dry with powders to absorb the moisture. RTC PRN  Meds ordered this encounter  Medications   nystatin cream (MYCOSTATIN)    Sig: Apply 1 Application topically 2 (two) times daily.    Dispense:  30 g    Refill:  0    No follow-ups on file.  Karie Georges, MD

## 2023-07-10 ENCOUNTER — Other Ambulatory Visit: Payer: Self-pay | Admitting: Family Medicine

## 2023-07-10 DIAGNOSIS — B372 Candidiasis of skin and nail: Secondary | ICD-10-CM

## 2023-07-10 DIAGNOSIS — G43019 Migraine without aura, intractable, without status migrainosus: Secondary | ICD-10-CM

## 2023-07-11 ENCOUNTER — Encounter: Payer: Self-pay | Admitting: Family Medicine

## 2023-07-11 DIAGNOSIS — G43019 Migraine without aura, intractable, without status migrainosus: Secondary | ICD-10-CM

## 2023-07-18 ENCOUNTER — Ambulatory Visit: Payer: PRIVATE HEALTH INSURANCE | Admitting: Family Medicine

## 2023-07-26 ENCOUNTER — Ambulatory Visit: Payer: PRIVATE HEALTH INSURANCE | Admitting: Family Medicine

## 2023-07-30 ENCOUNTER — Encounter: Payer: Self-pay | Admitting: Neurology

## 2023-07-30 NOTE — Telephone Encounter (Signed)
Ok to send referral to neurology for her migraines, failed triptans and nurtec

## 2023-08-09 ENCOUNTER — Ambulatory Visit: Payer: PRIVATE HEALTH INSURANCE | Admitting: Family Medicine

## 2023-08-12 ENCOUNTER — Ambulatory Visit: Payer: PRIVATE HEALTH INSURANCE | Admitting: Family Medicine

## 2023-08-14 ENCOUNTER — Ambulatory Visit: Payer: PRIVATE HEALTH INSURANCE | Admitting: Family Medicine

## 2023-08-21 ENCOUNTER — Ambulatory Visit: Payer: PRIVATE HEALTH INSURANCE | Admitting: Family Medicine

## 2023-08-22 ENCOUNTER — Ambulatory Visit: Payer: PRIVATE HEALTH INSURANCE | Admitting: Family Medicine

## 2023-08-26 ENCOUNTER — Ambulatory Visit: Payer: PRIVATE HEALTH INSURANCE | Admitting: Family Medicine

## 2023-08-27 ENCOUNTER — Ambulatory Visit: Payer: PRIVATE HEALTH INSURANCE | Admitting: Family Medicine

## 2023-09-03 ENCOUNTER — Ambulatory Visit (HOSPITAL_COMMUNITY)
Admission: EM | Admit: 2023-09-03 | Discharge: 2023-09-03 | Disposition: A | Discharge status: Home / Self Care | Payer: PRIVATE HEALTH INSURANCE

## 2023-09-03 DIAGNOSIS — F319 Bipolar disorder, unspecified: Secondary | ICD-10-CM | POA: Diagnosis not present

## 2023-09-03 NOTE — ED Provider Notes (Signed)
Behavioral Health Urgent Care Medical Screening Exam  Patient Name: Kristen Haney MRN: [id number] Date of Evaluation: 09/03/23 Chief Complaint:  Requesting to reestablish psychiatry services such as therapy and medication management. Diagnosis:  Final diagnoses:  Bipolar 1 disorder (HCC)    History of Present illness: Kristen Haney is a 33 y.o. female.patient presented to [facility] as a walk in unaccompanied wanting to reestablish psychiatry services.  Kristen Haney, 32 y.o., female patient seen face to face by this provider, consulted with Dr. Lucianne Muss; and chart reviewed on 09/03/23.  Per chart review patient has a past psychiatric history of bipolar 1 disorder, anxiety, and PTSD.  On evaluation Kristen Haney reports she is originally from the [place] area and has been in the [place] area for roughly 1-2 years.  She [facility]  However when her insurance changed she was no longer able to see provider.  For the past few months her psychiatric medications Lamictal 50 mg daily, hydroxyzine 100 mg nightly, and Wellbutrin 150 mg daily have been prescribed by her primary care provider.  Patient lives in home with her boyfriend and 3 children ages 27, 77, and 45.  Patient presents wanting to reestablish outpatient psychiatric services such as therapy and medication management.  During evaluation Kristen Haney is observed sitting in the assessment room in no acute distress.  She is well-groomed and makes good eye contact.  She is alert/oriented x 4, cooperative, and attentive.  She has normal speech and behavior.  She has a history of depression but believes that her depression is fairly stable at this time.  However she does endorses crying spells, isolation, unstable sleep patterns, decreased appetite and agitation.  She is endorsing an increase in her anxiety and attributes different stressors/triggers.  She identifies her current  employer as her biggest stressor.  States she has gotten written up multiple times due to being irritable, having mood swings and snapping at people.  Her boyfriend just relocated to [place] from [place] and he is currently unemployed.  She also finds it difficult to juggle work, home life, and taking care of her 3 children, she feels overwhelmed at times.  She appears anxious and taps her foot on the floor.  She adamantly denies SI/HI/AVH. Objectively there is no evidence of psychosis/mania or delusional thinking.  Patient is able to converse coherently, goal directed thoughts, no distractibility, or pre-occupation. Patient answered question appropriately.    Discussed partial hospitalization program with patient and she is in agreement.  Referral was made to [facility] PHP.  Flowsheet Row ED from 09/03/2023 in [facility] ED from 02/10/2023 in [facility] Emergency Department at [facility] ED from 03/19/2022 in [facility] Emergency Department at [facility]  C-SSRS RISK CATEGORY No Risk No Risk No Risk       Psychiatric Specialty Exam  Presentation  General Appearance:Appropriate for Environment; Casual  Eye Contact:Good  Speech:Clear and Coherent; Normal Rate  Speech Volume:No data recorded Handedness:Right   Mood and Affect  Mood:Anxious  Affect:Congruent   Thought Process  Thought Processes:Coherent  Descriptions of Associations:Intact  Orientation:Full (Time, Place and Person)  Thought Content:Logical    Hallucinations:None  Ideas of Reference:None  Suicidal Thoughts:No  Homicidal Thoughts:No   Sensorium  Memory:Immediate Good; Recent Good; Remote Good  Judgment:Good  Insight:Good   Executive Functions  Concentration:Good  Attention Span:Good  Recall:Good  Fund of Knowledge:Good  Language:Good   Psychomotor Activity  Psychomotor Activity:Normal  Assets  Assets:Communication Skills; Desire for  Improvement; Financial Resources/Insurance; Housing; Intimacy; Physical Health; Resilience; Social Support   Sleep  Sleep:Fair  Number of hours: 8   Physical Exam: Physical Exam Vitals and nursing note reviewed.  Constitutional:      General: She is not in acute distress.    Appearance: Normal appearance. She is not ill-appearing.  Cardiovascular:     Rate and Rhythm: Normal rate.  Pulmonary:     Effort: Pulmonary effort is normal.  Musculoskeletal:        General: Normal range of motion.     Cervical back: Normal range of motion.  Skin:    Coloration: Skin is not jaundiced or pale.  Neurological:     Mental Status: She is alert and oriented to person, place, and time.  Psychiatric:        Mood and Affect: Affect normal.    Review of Systems  Constitutional: Negative.   HENT: Negative.    Eyes: Negative.   Respiratory: Negative.    Cardiovascular: Negative.   Musculoskeletal: Negative.   Skin: Negative.   Neurological: Negative.   Psychiatric/Behavioral:  The patient is nervous/anxious.    Blood pressure (!) 132/97, pulse 92, temperature 98.5 F (36.9 C), temperature source Oral, resp. rate 18, SpO2 100%, unknown if currently breastfeeding. There is no height or weight on file to calculate BMI.  Musculoskeletal: Strength & Muscle Tone: within normal limits Gait & Station: normal Patient leans: N/A   [facility] MSE Discharge Disposition for Follow up and Recommendations: Based on my evaluation the patient does not appear to have an emergency medical condition and can be discharged with resources and follow up care in outpatient services for Medication Management, Partial Hospitalization Program, and Individual Therapy  Discharge patient  Patient referred to [facility] PHP program  In addition outpatient psychiatric resources for therapy and medication management provided.   Ardis Hughs, NP 09/03/2023, 6:49 PM

## 2023-09-03 NOTE — Progress Notes (Signed)
   09/03/23 1701  [facility] Triage Screening (Walk-ins at [facility] only)  What Is the Reason for Your Visit/Call Today? Pt presents to [facility] voluntarily unaccompanied seeking an evaluation. Pt reports crying spells, isolation, unstable sleeping patterns, decreased appetite, agitation. Pt states she is struggling at her job to function properly due to increased stress. Pt reports she has 2 children 12, and 5.  She reports her boyfriend just relocated to [facility] from [place] and he is unemployed and this is a stressor for her. Pt states she was written up twice at work this year for issues with staff and the customers pertaining to her mood swings. Pt reports being diagnosed with Bipolar I, PTSD, anxiety and depression. [facility] and therapy but her insurance changed at her job and they are no longer in network. Her last visit was in March. Pt is trying to re-establish care and she is also interested in intensive outpatient.Pt denies SI/HI and AVH.  How Long Has This Been Causing You Problems? <Week  Have You Recently Had Any Thoughts About Hurting Yourself? No  Are You Planning to Commit Suicide/Harm Yourself At This time? No  Have you Recently Had Thoughts About Hurting Someone Karolee Ohs? No  Are You Planning To Harm Someone At This Time? No  Are you currently experiencing any auditory, visual or other hallucinations? No  Have You Used Any Alcohol or Drugs in the Past 24 Hours? No  Do you have any current medical co-morbidities that require immediate attention? No  Clinician description of patient physical appearance/behavior: tearful, nervous, casually dressed, cooperative  What Do You Feel Would Help You the Most Today? Treatment for Depression or other mood problem  If access to [facility] was not available, would you have sought care in the Emergency Department? No  Determination of Need Routine (7 days)  Options For Referral Intensive Outpatient Therapy;Medication  Management;Outpatient Therapy

## 2023-09-03 NOTE — Discharge Instructions (Signed)
It is imperative that you follow through with treatment recommendations within 5-7 days from the day of discharge to mitigate further risk to your safety and overall mental well-being.  A list of outpatient therapy and psychiatric providers for medication management has been provided below to get you started in finding the right provider for you.            [place]  [facility] [address] [place], [place], [address] [phone] phone (Medicare, Private insurance except Tricare, Blue Local, and Blue Home)  [facility] [address] [place], [place], [address] [phone] phone (Aetna, AmeriHealth [place], BCBS, Cigna, Evernorth, Friday Health Plans, Gateway Health, BCBS Healthy Blue, Humana, Magellan Health, Medcost, Medicare, Medicaid, Optum, Tricare, UHC, UHC Community Plan, Wellcare)  [facility] [address] [place], [place], [address] [phone] phone [phone] phone [phone] fax  [facility] [address] [place], [place], [address] [phone] phone (Call to confirm insurance coverage) Consultation & Support Services     o Drop-In Hours: 1:00 PM to 5:00 PM     o Days: Monday - Thursday  [facility] (24/7)   [facility] [address] [place], [place], [address] [phone] phone (Healthy [place], UHC, [place] Medicaid, Vaya and Partners, Humana)      [facility] [address] [place], [place], [address] [phone] phone [email or web address]  (to complete the intake form and upload ID and insurance cards)  [facility] [address] [place], [place], [address] [phone] phone (Aetna, Alliance, BCBS, [place] Complete Health, Cigna, Medicare, Optum, UHC, Wellcare, and certain Medicaid plans)  [facility] [address] [place], [place], [address] [phone] phone [phone] fax (Medicaid, Medicare, Self-pay, call about other insurance coverage)  [facility] (age 13+) [address] [place], [place], [address] [phone] hone [phone] fax (Cigna, MedCost, BCBS, Aetna, Magellan, First Health, Healthgram, Humana, Tricare, Multiplan, certain Medicare providers, UHC, UMR)  [facility] [address] [place], [place], [address] [phone] phone (Medicare, Medicaid, Aetna, Cigna, call about other insurance coverage)  [facility] [address] [place], [place], [address] [phone] phone [phone] fax (Call [phone] to see what insurance is accepted) (Gerald Plovsky, MD specializes in geropsych)  [facility]  (medication management only) [address] [place], [place], [address] [phone] phone [phone] fax (Medicare, Medicaid, BCBS, Tricare, Humana, MedCost, Cigna, UHC, UMR)  [facility] (medication management only) [address] [place], [place], [address] [phone] phone [phone] fax (Cigna, Medicare, BCBS, Aetna, Tricare East)  [facility] [address] [place], [place], [address] [phone] phone [phone] fax (Aetna, BCBS, Cigna, Magellan, MedCost, UHC, [place] Medicaid/[place] Health Choice)  [facility] [address] [place], [place], [address] [phone] phone [phone] fax (Medicare, Medicaid, BCBS)  [facility] [address] [place], [place] [address] [phone] phone (Aetna, BCBS, Cigna, CBHA, MedCost, Medicare, Magellan, UHC) Does genetic testing for medications; does transcranial magnetic stimulation along with basic services)  [facility] [address] [place], [place],  [address] [phone] phone (Call about insurance coverage)  [facility] [address] [place], [place], [address] [phone] phone [phone] fax (Call about insurance coverage)  [facility] [address] , , [address] [phone] phone [phone]   fax (Call about insurance coverage)  [facility] [address] [place], [place], [address] [phone] phone (Medicaid, Tricare, BCBS, Aetna, Humana)  [facility] [address] [place], [place], [address] [phone] phone (Medicaid, Medicare, call about other insurance coverage)  [facility] [address] [place], [place], [address] [phone] phone [phone] fax (Medicaid, Medicare, Tricare, call about other insurance coverage)  [facility] [address] [place], [place], [address] [phone] phone (Aetna, BCBS, Cigna, MedCost, Tricare, Medicaid types - Alliance, AmeriHealth, Partners, Vaya, [place] Health Choice, Healthy Blue, [place], Wellcare, and Complete)  [facility] [address] [place], [place], [address] [phone] phone Completely online treatment platform Contact: Jonay Argier - Behavioral Health Outreach Specialist [phone] phone [phone] fax (Aetna, BCBS, Cigna, Friday Health Plan, Humana, Magellan, MedCost, Medicaid, Medicare, UBH Optum)                                               

## 2023-09-04 ENCOUNTER — Telehealth (HOSPITAL_COMMUNITY): Payer: Self-pay | Admitting: Licensed Clinical Social Worker

## 2023-09-06 ENCOUNTER — Ambulatory Visit: Payer: PRIVATE HEALTH INSURANCE | Admitting: Family Medicine

## 2023-09-20 ENCOUNTER — Ambulatory Visit: Payer: PRIVATE HEALTH INSURANCE | Admitting: Family Medicine

## 2023-10-03 ENCOUNTER — Other Ambulatory Visit (HOSPITAL_COMMUNITY): Payer: Self-pay

## 2023-10-03 ENCOUNTER — Telehealth: Payer: Self-pay

## 2023-10-03 ENCOUNTER — Telehealth: Payer: Self-pay | Admitting: Family Medicine

## 2023-10-03 DIAGNOSIS — L732 Hidradenitis suppurativa: Secondary | ICD-10-CM

## 2023-10-03 NOTE — Telephone Encounter (Signed)
PA request has been Submitted. New Encounter created for follow up. For additional info see Pharmacy Prior Auth telephone encounter from 10/03/23.

## 2023-10-03 NOTE — Telephone Encounter (Signed)
Kristen Haney is calling and pt need another PA clindamycin-benzoyl peroxide (BENZACLIN) gel  the current one expires on 10-16-2023  [facility] [id number] [place], [place] - [address] Phone: [phone]  Fax: [phone]

## 2023-10-03 NOTE — Telephone Encounter (Signed)
Pharmacy Patient Advocate Encounter   Received notification from Pt Calls Messages that prior authorization for clindamycin-benzoyl peroxide gel  is required/requested.   Insurance verification completed.   The patient is insured through  [employer]  .   Per test claim: PA required; PA submitted to [employer] via [email or web address] Key/confirmation #[id number] --- Status is pending

## 2023-10-09 NOTE — Telephone Encounter (Signed)
Pharmacy Patient Advocate Encounter  Received notification from  [employer]  that Prior Authorization for clindamycin-benzoyl peroxide gel has been DENIED.  Full denial letter will be uploaded to the media tab. See denial reason below.   DENIAL REASON: No clinical notes available. Last PA was approved with clinical notes of 04/2023 same clinical notes received this time.   Please be advised we currently do not have a Pharmacist to review denials, therefore you will need to process appeals accordingly as needed. Thanks for your support at this time. Contact for appeals are as follows: Phone: [phone], Fax: [phone]

## 2023-10-10 ENCOUNTER — Ambulatory Visit: Payer: PRIVATE HEALTH INSURANCE | Admitting: Family Medicine

## 2023-10-15 ENCOUNTER — Other Ambulatory Visit (HOSPITAL_COMMUNITY): Payer: Self-pay

## 2023-10-15 MED ORDER — CLINDAMYCIN PHOSPHATE 1 % EX GEL
Freq: Two times a day (BID) | CUTANEOUS | 5 refills | Status: AC
Start: 2023-10-15 — End: ?

## 2023-10-15 NOTE — Addendum Note (Signed)
Addended by: Karie Georges on: 10/15/2023 09:16 AM   Modules accepted: Orders

## 2023-10-15 NOTE — Telephone Encounter (Signed)
I sent a different rx to see if this would be covered.

## 2023-10-16 NOTE — Telephone Encounter (Signed)
Called pharmacy to see if PA was required. Clindamycin gel went through insurance, No PA required.   Pharmacy said that both Clindamycin and Benzaclin went through insurance and wanted to know which one to fill. Please advise patient on which one to pick up.

## 2023-10-17 NOTE — Telephone Encounter (Signed)
Oh I thought the benzaclin was denied-- I got a task in by box about it-- have him fill the benzaclin since it went through

## 2023-10-17 NOTE — Telephone Encounter (Signed)
Spoke with Marchelle Folks at [facility] pharmacy and advised her per PCP to fill the Rx for Benzaclin as below.

## 2023-10-21 ENCOUNTER — Other Ambulatory Visit: Payer: Self-pay | Admitting: Family Medicine

## 2023-10-21 DIAGNOSIS — G43801 Other migraine, not intractable, with status migrainosus: Secondary | ICD-10-CM

## 2023-10-22 ENCOUNTER — Other Ambulatory Visit (HOSPITAL_COMMUNITY): Payer: Self-pay

## 2023-10-28 ENCOUNTER — Telehealth: Payer: Self-pay | Admitting: *Deleted

## 2023-10-28 DIAGNOSIS — G43801 Other migraine, not intractable, with status migrainosus: Secondary | ICD-10-CM

## 2023-10-28 MED ORDER — GABAPENTIN 300 MG PO CAPS
300.0000 mg | ORAL_CAPSULE | Freq: Two times a day (BID) | ORAL | 0 refills | Status: DC
Start: 2023-10-28 — End: 2024-05-08

## 2023-10-28 NOTE — Telephone Encounter (Signed)
Rx done. 

## 2023-10-30 ENCOUNTER — Encounter: Payer: Self-pay | Admitting: Family Medicine

## 2023-10-30 DIAGNOSIS — F319 Bipolar disorder, unspecified: Secondary | ICD-10-CM

## 2023-10-30 MED ORDER — LAMOTRIGINE 25 MG PO TABS: 50.0000 mg | ORAL_TABLET | Freq: Every evening | ORAL | 2 refills | Status: DC

## 2023-11-04 ENCOUNTER — Encounter: Payer: Self-pay | Admitting: Family Medicine

## 2023-11-05 NOTE — Progress Notes (Unsigned)
NEUROLOGY CONSULTATION NOTE  Kristen Haney MRN: [id number] DOB: 08-03-1991  Referring provider: Nira Conn, MD Primary care provider: Nira Conn, MD  Reason for consult:  migraines  Assessment/Plan:   Chronic migraine without aura, without status migrainosus, not intractable  Patient is under the care of psychiatry for her depression and Bipolar disorder.  She is already on bupropion and lamotrigine.  To avoid negatively impacting her treatment, I do not want to start another antidepressant or antiepileptic medication.  I would like to avoid a beta blocker so as not to aggravate her depression.  Therefore, I feel she is a candidate for a CGRP inhibitor preventative. Migraine prevention:  Plan to start Aimovig 140mg  every 28 days Migraine rescue:  Rizatriptan-MLT 10mg  and promethazine 25mg  Limit use of pain relievers to no more than 2 days out of week to prevent risk of rebound or medication-overuse headache. Keep headache diary Follow up 5 months.    Subjective:  Kristen Haney is a 32 year old right-handed female with hidradenitis, asthma, Bioplar 1 disorder, depression and chronic post-thoracotomy pain who presents for migraines.  History supplemented by referring provider's notes.  Onset:  24-54 years old, progressively gotten worse over the years.  Has been chronic for several years. Location:  bifrontal, bilateral periorbital, bilateral maxillary Quality:  pounding Intensity:  Severe.   Aura:  absent Prodrome:  absent Associated symptoms:  Photophobia, phonophobia, osmophobia, sees little black spots, nausea, sometimes vomiting.  She denies associated unilateral numbness or weakness. Duration:  1 day Frequency:  15-20 days a month Frequency of abortive medication: does not use analgesics Triggers:  looking at a computer screen all day, coffee Relieving factors:  none Activity:  aggravates, cannot function 20 days a month  CT head on 02/10/2023 personally  reviewed was unremarkable.  CT cervical spine personally reviewed was unremarkable.    Past NSAIDS/analgesics:  naproxen, tramadol, ibuprofen, Toradol, Excedrin Migraine Past abortive triptans:  sumatriptan tab Past abortive ergotamine:  none Past muscle relaxants:  none Past anti-emetic:  Zofran, Reglan Past antihypertensive medications:  none Past antidepressant medications:  none Past anticonvulsant medications:  none Past anti-CGRP:  Nurtec PRN Other past therapies:  ice packs.  Current NSAIDS/analgesics:  none Current triptans:  none Current ergotamine:  none Current anti-emetic:  Phenergan 25mg  Current muscle relaxants:  Flexeril 5-10mg  TID PRN Current Antihypertensive medications:  none Current Antidepressant medications:  Wellbutrin XL 300mg  daily Current Anticonvulsant medications:  lamotrigine 50mg  at bedtime, gabapentin 300mg  twice daily Current anti-CGRP:  none Current Vitamins/Herbal/Supplements:  none Current Antihistamines/Decongestants:  none Other therapy:  none Birth control:  none Other medications:  hydroxyzine, trazodone, albuterol, sildenafil   Caffeine:  decaff coffee since April.  Tea Diet:  Water, tea.  No soda. Exercise:  walks at work Depression:  stable; Anxiety:  stable Other pain:  chronic pain Sleep hygiene:  6.5-7 hours on trazodone.  Overall, feels rested. Family history of headache:  mom (migraines)      PAST MEDICAL HISTORY: Past Medical History:  Diagnosis Date   Abscesses of both axillae    Asthma    Bipolar 1 disorder (HCC)    Chronic post-thoracotomy pain    Depression    Hidradenitis suppurativa    Hx of migraines    Hx: UTI (urinary tract infection)    Medical history non-contributory     PAST SURGICAL HISTORY: Past Surgical History:  Procedure Laterality Date   INCISION AND DRAINAGE Bilateral    axillary regions due to HS   NONE  TUBAL LIGATION      MEDICATIONS: Current Outpatient Medications on File Prior to  Visit  Medication Sig Dispense Refill   albuterol (VENTOLIN HFA) 108 (90 Base) MCG/ACT inhaler Inhale 1-2 puffs into the lungs every 6 (six) hours as needed for wheezing or shortness of breath. 6.7 g 0   benzoyl peroxide 10 % LIQD Apply 1 Application topically daily. 227 g 5   buPROPion (WELLBUTRIN XL) 300 MG 24 hr tablet Take 1 tablet (300 mg total) by mouth daily. 90 tablet 1   clindamycin (CLINDAGEL) 1 % gel Apply topically 2 (two) times daily. Use with the benzoyl peroxide gel. 30 g 5   cyclobenzaprine (FLEXERIL) 10 MG tablet Take 0.5-1 tablets (5-10 mg total) by mouth 3 (three) times daily as needed for muscle spasms. 30 tablet 5   gabapentin (NEURONTIN) 300 MG capsule Take 1 capsule (300 mg total) by mouth 2 (two) times daily. 180 capsule 0   hydrOXYzine (ATARAX) 50 MG tablet Take 2 tablets (100 mg total) by mouth at bedtime. 180 tablet 1   lamoTRIgine (LAMICTAL) 25 MG tablet Take 2 tablets (50 mg total) by mouth at bedtime. 60 tablet 2   nystatin cream (MYCOSTATIN) APPLY TO THE AFFECTED AREA(S) 2 TIMES A DAY 30 g 0   nystatin-triamcinolone ointment (MYCOLOG) APPLY TO THE AFFECTED AREA(S) TWO TIMES A DAY FOR 14 DAYS 30 g 2   sildenafil (VIAGRA) 50 MG tablet Take 0.5 tablets (25 mg total) by mouth daily as needed for erectile dysfunction. 10 tablet 5   hydrOXYzine (VISTARIL) 25 MG capsule Take 1-2 capsules (25-50 mg total) by mouth every 8 (eight) hours as needed. Take 30-45 minutes before donating plasma for anxiety (Patient not taking: Reported on 11/06/2023) 30 capsule 0   Rimegepant Sulfate (NURTEC) 75 MG TBDP PLACE 1 TABLET BY MOUTH DAILY AS NEEDED (Patient not taking: Reported on 11/06/2023) 8 tablet 5   No current facility-administered medications on file prior to visit.      ALLERGIES: No Known Allergies  FAMILY HISTORY: Family History  Problem Relation Age of Onset   Hypertension Mother    Asthma Son    Cancer Maternal Aunt        PANCREATIC   Stroke Paternal Grandmother      Objective:  Blood pressure 108/77, pulse 90, height 5\' 7"  (1.702 m), weight 260 lb 12.8 oz (118.3 kg), SpO2 100%, unknown if currently breastfeeding. General: No acute distress.  Patient appears well-groomed.   Head:  Normocephalic/atraumatic Eyes:  fundi examined but not visualized Neck: supple, no paraspinal tenderness, full range of motion Heart: regular rate and rhythm Neurological Exam: Mental status: alert and oriented to person, place, and time, speech fluent and not dysarthric, language intact. Cranial nerves: CN I: not tested CN II: pupils equal, round and reactive to light, visual fields intact CN III, IV, VI:  full range of motion, no nystagmus, no ptosis CN V: facial sensation intact. CN VII: upper and lower face symmetric CN VIII: hearing intact CN IX, X: gag intact, uvula midline CN XI: sternocleidomastoid and trapezius muscles intact CN XII: tongue midline Bulk & Tone: normal, no fasciculations. Motor:  muscle strength 5/5 throughout Sensation:  Pinprick and vibratory sensation intact. Deep Tendon Reflexes:  2+ throughout,  toes downgoing.   Finger to nose testing:  Without dysmetria.   Gait:  Normal station and stride.  Romberg negative.    Thank you for allowing me to take part in the care of this patient.  Shon Millet,  DO  CC: Nira Conn, MD

## 2023-11-06 ENCOUNTER — Encounter: Payer: Self-pay | Admitting: Neurology

## 2023-11-06 ENCOUNTER — Ambulatory Visit: Payer: PRIVATE HEALTH INSURANCE | Admitting: Neurology

## 2023-11-06 VITALS — BP 108/77 | HR 90 | Ht 67.0 in | Wt 260.8 lb

## 2023-11-06 DIAGNOSIS — G43709 Chronic migraine without aura, not intractable, without status migrainosus: Secondary | ICD-10-CM | POA: Diagnosis not present

## 2023-11-06 DIAGNOSIS — G43019 Migraine without aura, intractable, without status migrainosus: Secondary | ICD-10-CM

## 2023-11-06 MED ORDER — AIMOVIG 140 MG/ML ~~LOC~~ SOAJ: 140.0000 mg | SUBCUTANEOUS | 11 refills | Status: AC

## 2023-11-06 MED ORDER — RIZATRIPTAN BENZOATE 10 MG PO TBDP: 10.0000 mg | ORAL_TABLET | ORAL | 5 refills | Status: AC | PRN

## 2023-11-06 MED ORDER — PROMETHAZINE HCL 25 MG PO TABS
25.0000 mg | ORAL_TABLET | Freq: Three times a day (TID) | ORAL | 5 refills | Status: AC | PRN
Start: 2023-11-06 — End: ?

## 2023-11-06 NOTE — Patient Instructions (Signed)
  Start Aimovig 140mg  injection every 28 days.   Take rizatriptan 10mg  at earliest onset of headache.  May repeat dose once in 2 hours if needed.  Maximum 2 tablets in 24 hours.  Take promethazine at earliest onset as well Limit use of pain relievers to no more than 2 days out of the week.  These medications include acetaminophen, NSAIDs (ibuprofen/Advil/Motrin, naproxen/Aleve, triptans (Imitrex/sumatriptan), Excedrin, and narcotics.  This will help reduce risk of rebound headaches. Be aware of common food triggers Routine exercise Stay adequately hydrated (aim for 64 oz water daily) Keep headache diary Maintain proper stress management Maintain proper sleep hygiene Do not skip meals Consider supplements:  magnesium citrate 400mg  daily, riboflavin 400mg  daily, coenzyme Q10 300mg  daily

## 2023-12-01 ENCOUNTER — Other Ambulatory Visit: Payer: Self-pay | Admitting: Family Medicine

## 2023-12-01 DIAGNOSIS — G8929 Other chronic pain: Secondary | ICD-10-CM

## 2023-12-11 ENCOUNTER — Telehealth: Payer: PRIVATE HEALTH INSURANCE | Admitting: Nurse Practitioner

## 2023-12-11 DIAGNOSIS — G8929 Other chronic pain: Secondary | ICD-10-CM

## 2023-12-11 DIAGNOSIS — M542 Cervicalgia: Secondary | ICD-10-CM | POA: Diagnosis not present

## 2023-12-11 MED ORDER — CYCLOBENZAPRINE HCL 10 MG PO TABS: 10.0000 mg | ORAL_TABLET | Freq: Three times a day (TID) | ORAL | 0 refills | Status: DC | PRN

## 2023-12-11 MED ORDER — NAPROXEN 500 MG PO TABS: 500.0000 mg | ORAL_TABLET | Freq: Two times a day (BID) | ORAL | 0 refills | Status: AC

## 2023-12-11 NOTE — Progress Notes (Signed)
Virtual Visit Consent   Kristen Haney, you are scheduled for a virtual visit with a [facility] provider today. Just as with appointments in the office, your consent must be obtained to participate. Your consent will be active for this visit and any virtual visit you may have with one of our providers in the next 365 days. If you have a MyChart account, a copy of this consent can be sent to you electronically.  As this is a virtual visit, video technology does not allow for your provider to perform a traditional examination. This may limit your provider's ability to fully assess your condition. If your provider identifies any concerns that need to be evaluated in person or the need to arrange testing (such as labs, EKG, etc.), we will make arrangements to do so. Although advances in technology are sophisticated, we cannot ensure that it will always work on either your end or our end. If the connection with a video visit is poor, the visit may have to be switched to a telephone visit. With either a video or telephone visit, we are not always able to ensure that we have a secure connection.  By engaging in this virtual visit, you consent to the provision of healthcare and authorize for your insurance to be billed (if applicable) for the services provided during this visit. Depending on your insurance coverage, you may receive a charge related to this service.  I need to obtain your verbal consent now. Are you willing to proceed with your visit today? Kristen Haney has provided verbal consent on 12/11/2023 for a virtual visit (video or telephone). [name], FNP  Date: 12/11/2023 4:24 PM  Virtual Visit via Video Note   I, [name], connected with  Kristen Haney  ([id number], 08-Jun-1991) on 12/11/23 at  4:45 PM EST by a video-enabled telemedicine application and verified that I am speaking with the correct person using two identifiers.  Location: Patient: Virtual Visit Location Patient:  Home Provider: Virtual Visit Location Provider: Home Office   I discussed the limitations of evaluation and management by telemedicine and the availability of in person appointments. The patient expressed understanding and agreed to proceed.    History of Present Illness: Kristen Haney is a 32 y.o. who identifies as a female who was assigned female at birth, and is being seen today for neck pain  She has been experiencing neck pain to her shoulder has been burning-she purchased a neck brace today to see if that would improve the pain - no relief   Denies numbness or tingling to shoulder or arm  Pain radiates into left arm from neck   Denies an injury to her neck  She sits at a desk all day and feels the neck pain is from posture  She can look up to the ceiling and can rotate left and right more pain with looking over left shoulder   Flexeril last prescribed in March  Gabapentin for migraines prescribed in October  Also takes Maxalt and Nurtec for migraine management   CT cervical spine performed in February of this year    Problems:  Patient Active Problem List   Diagnosis Date Noted   Anxiety 04/24/2023   Bipolar 1 disorder (HCC) 04/24/2023   Well woman exam with routine gynecological exam 11/08/2022   Hidradenitis suppurativa 10/15/2022   Dermatitis due to unknown cause 10/15/2022   Chronic neck pain 10/15/2022   PTSD (post-traumatic stress disorder) 06/14/2019   Major depressive disorder, 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   Intentional drug overdose (HCC)    Sciatica 02/03/2018   Sickle cell trait (HCC)    Migraines 06/07/1998    Allergies: No Known Allergies Medications:  Current Outpatient Medications:    albuterol (VENTOLIN HFA) 108 (90 Base) MCG/ACT inhaler, Inhale 1-2 puffs into the lungs every 6 (six) hours as needed for wheezing or shortness of breath., Disp: 6.7 g, Rfl: 0   benzoyl peroxide 10 % LIQD, Apply 1 Application topically  daily., Disp: 227 g, Rfl: 5   buPROPion (WELLBUTRIN XL) 300 MG 24 hr tablet, Take 1 tablet (300 mg total) by mouth daily., Disp: 90 tablet, Rfl: 1   clindamycin (CLINDAGEL) 1 % gel, Apply topically 2 (two) times daily. Use with the benzoyl peroxide gel., Disp: 30 g, Rfl: 5   cyclobenzaprine (FLEXERIL) 10 MG tablet, Take 0.5-1 tablets (5-10 mg total) by mouth 3 (three) times daily as needed for muscle spasms., Disp: 30 tablet, Rfl: 5   Erenumab-aooe (AIMOVIG) 140 MG/ML SOAJ, Inject 140 mg into the skin every 28 (twenty-eight) days., Disp: 1.12 mL, Rfl: 11   gabapentin (NEURONTIN) 300 MG capsule, Take 1 capsule (300 mg total) by mouth 2 (two) times daily., Disp: 180 capsule, Rfl: 0   hydrOXYzine (ATARAX) 50 MG tablet, Take 2 tablets (100 mg total) by mouth at bedtime., Disp: 180 tablet, Rfl: 1   hydrOXYzine (VISTARIL) 25 MG capsule, Take 1-2 capsules (25-50 mg total) by mouth every 8 (eight) hours as needed. Take 30-45 minutes before donating plasma for anxiety (Patient not taking: Reported on 11/06/2023), Disp: 30 capsule, Rfl: 0   lamoTRIgine (LAMICTAL) 25 MG tablet, Take 2 tablets (50 mg total) by mouth at bedtime., Disp: 60 tablet, Rfl: 2   nystatin cream (MYCOSTATIN), APPLY TO THE AFFECTED AREA(S) 2 TIMES A DAY, Disp: 30 g, Rfl: 0   nystatin-triamcinolone ointment (MYCOLOG), APPLY TO THE AFFECTED AREA(S) TWO TIMES A DAY FOR 14 DAYS, Disp: 30 g, Rfl: 2   promethazine (PHENERGAN) 25 MG tablet, Take 1 tablet (25 mg total) by mouth every 8 (eight) hours as needed for nausea or vomiting., Disp: 15 tablet, Rfl: 5   Rimegepant Sulfate (NURTEC) 75 MG TBDP, PLACE 1 TABLET BY MOUTH DAILY AS NEEDED (Patient not taking: Reported on 11/06/2023), Disp: 8 tablet, Rfl: 5   rizatriptan (MAXALT-MLT) 10 MG disintegrating tablet, Take 1 tablet (10 mg total) by mouth as needed for migraine. May repeat in 2 hours if needed.  Maximum 2 tablets in 24 hours., Disp: 10 tablet, Rfl: 5   sildenafil (VIAGRA) 50 MG tablet, Take  0.5 tablets (25 mg total) by mouth daily as needed for erectile dysfunction., Disp: 10 tablet, Rfl: 5  Observations/Objective:  No labored breathing.  Speech is clear and coherent with logical content.  Patient is alert and oriented at baseline.    Assessment and Plan:  1. Chronic neck pain  - cyclobenzaprine (FLEXERIL) 10 MG tablet; Take 1 tablet (10 mg total) by mouth 3 (three) times daily as needed for muscle spasms.  Dispense: 30 tablet; Refill: 0 - naproxen (NAPROSYN) 500 MG tablet; Take 1 tablet (500 mg total) by mouth 2 (two) times daily with a meal for 10 days.  Dispense: 20 tablet; Refill: 0    Follow up with PCP regarding recurrent pain and plan for further evaluation   Follow Up Instructions: I discussed the assessment and treatment plan with the patient. The patient was provided an opportunity to ask questions and all were answered. The patient agreed  with the plan and demonstrated an understanding of the instructions.  A copy of instructions were sent to the patient via MyChart unless otherwise noted below.    The patient was advised to call back or seek an in-person evaluation if the symptoms worsen or if the condition fails to improve as anticipated.    [name], FNP

## 2024-01-08 ENCOUNTER — Telehealth: Payer: PRIVATE HEALTH INSURANCE | Admitting: Family Medicine

## 2024-01-08 DIAGNOSIS — L732 Hidradenitis suppurativa: Secondary | ICD-10-CM | POA: Diagnosis not present

## 2024-01-08 DIAGNOSIS — R112 Nausea with vomiting, unspecified: Secondary | ICD-10-CM | POA: Diagnosis not present

## 2024-01-08 MED ORDER — DOXYCYCLINE HYCLATE 100 MG PO TABS: 100.0000 mg | ORAL_TABLET | Freq: Two times a day (BID) | ORAL | 0 refills | Status: AC

## 2024-01-08 NOTE — Progress Notes (Signed)
 Virtual Visit Consent   Kristen Haney, you are scheduled for a virtual visit with a [facility] provider today. Just as with appointments in the office, your consent must be obtained to participate. Your consent will be active for this visit and any virtual visit you may have with one of our providers in the next 365 days. If you have a MyChart account, a copy of this consent can be sent to you electronically.  As this is a virtual visit, video technology does not allow for your provider to perform a traditional examination. This may limit your provider's ability to fully assess your condition. If your provider identifies any concerns that need to be evaluated in person or the need to arrange testing (such as labs, EKG, etc.), we will make arrangements to do so. Although advances in technology are sophisticated, we cannot ensure that it will always work on either your end or our end. If the connection with a video visit is poor, the visit may have to be switched to a telephone visit. With either a video or telephone visit, we are not always able to ensure that we have a secure connection.  By engaging in this virtual visit, you consent to the provision of healthcare and authorize for your insurance to be billed (if applicable) for the services provided during this visit. Depending on your insurance coverage, you may receive a charge related to this service.  I need to obtain your verbal consent now. Are you willing to proceed with your visit today? Ireoluwa Bagnall has provided verbal consent on 01/08/2024 for a virtual visit (video or telephone). Chiquita CHRISTELLA Barefoot, NP  Date: 01/08/2024 10:16 AM  Virtual Visit via Video Note   I, Chiquita CHRISTELLA Barefoot, connected with  Kristen Haney  ([id number], [birth year]) on 01/08/24 at 10:15 AM EST by a video-enabled telemedicine application and verified that I am speaking with the correct person using two identifiers.  Location: Patient: Virtual Visit Location Patient:  Home Provider: Virtual Visit Location Provider: Home Office   I discussed the limitations of evaluation and management by telemedicine and the availability of in person appointments. The patient expressed understanding and agreed to proceed.    History of Present Illness: Kristen Haney is a 33 y.o. who identifies as a female who was assigned female at birth, and is being seen today for several concerns- stomach bug and boil  Boil- Friday onset- swelling trying to use medications, hot compressed, under right arm- along scar tissue and it is not coming to a head. Has not had one in a few months. Reports usually the cream helps, but this one is not responding to it. No drainage, warming or signs or infection. Bump is noted to be a quarter size or bigger.   Stomach upset- nausea and vomiting, some diarrhea. Fevers with temp of 101.3, temp has improved now. Last episode of vomiting was last night around 9 am. Reports being around a sick friend with PNA, but no stomach issues.     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   Intentional drug overdose (HCC)    Sciatica 02/03/2018   Sickle  cell trait (HCC)    Migraines 06/07/1998    Allergies: No Known Allergies Medications:  Current Outpatient Medications:    albuterol  (VENTOLIN  HFA) 108 (90 Base) MCG/ACT inhaler, Inhale 1-2 puffs into the lungs every 6 (six) hours as needed for wheezing or shortness of breath., Disp: 6.7 g, Rfl: 0   benzoyl peroxide  10 % LIQD, Apply 1 Application topically daily., Disp: 227 g, Rfl: 5   buPROPion  (WELLBUTRIN  XL) 300 MG  24 hr tablet, Take 1 tablet (300 mg total) by mouth daily., Disp: 90 tablet, Rfl: 1   clindamycin  (CLINDAGEL) 1 % gel, Apply topically 2 (two) times daily. Use with the benzoyl peroxide  gel., Disp: 30 g, Rfl: 5   cyclobenzaprine  (FLEXERIL ) 10 MG tablet, Take 1 tablet (10 mg total) by mouth 3 (three) times daily as needed for muscle spasms., Disp: 30 tablet, Rfl: 0   Erenumab -aooe (AIMOVIG ) 140 MG/ML SOAJ, Inject 140 mg into the skin every 28 (twenty-eight) days., Disp: 1.12 mL, Rfl: 11   gabapentin  (NEURONTIN ) 300 MG capsule, Take 1 capsule (300 mg total) by mouth 2 (two) times daily., Disp: 180 capsule, Rfl: 0   hydrOXYzine  (ATARAX ) 50 MG tablet, Take 2 tablets (100 mg total) by mouth at bedtime., Disp: 180 tablet, Rfl: 1   hydrOXYzine  (VISTARIL ) 25 MG capsule, Take 1-2 capsules (25-50 mg total) by mouth every 8 (eight) hours as needed. Take 30-45 minutes before donating plasma for anxiety (Patient not taking: Reported on 11/06/2023), Disp: 30 capsule, Rfl: 0   lamoTRIgine  (LAMICTAL ) 25 MG tablet, Take 2 tablets (50 mg total) by mouth at bedtime., Disp: 60 tablet, Rfl: 2   nystatin  cream (MYCOSTATIN ), APPLY TO THE AFFECTED AREA(S) 2 TIMES A DAY, Disp: 30 g, Rfl: 0   nystatin -triamcinolone  ointment (MYCOLOG), APPLY TO THE AFFECTED AREA(S) TWO TIMES A DAY FOR 14 DAYS, Disp: 30 g, Rfl: 2   promethazine  (PHENERGAN ) 25 MG tablet, Take 1 tablet (25 mg total) by mouth every 8 (eight) hours as needed for nausea or vomiting., Disp: 15 tablet, Rfl: 5   Rimegepant Sulfate (NURTEC) 75 MG TBDP, PLACE 1 TABLET BY MOUTH DAILY AS NEEDED (Patient not taking: Reported on 11/06/2023), Disp: 8 tablet, Rfl: 5   rizatriptan  (MAXALT -MLT) 10 MG disintegrating tablet, Take 1 tablet (10 mg total) by mouth as needed for migraine. May repeat in 2 hours if needed.  Maximum 2 tablets in 24 hours., Disp: 10 tablet, Rfl: 5   sildenafil  (VIAGRA ) 50 MG tablet, Take 0.5 tablets (25 mg total) by mouth daily as needed for erectile  dysfunction., Disp: 10 tablet, Rfl: 5  Observations/Objective: Patient is well-developed, well-nourished in no acute distress.  Resting comfortably  at home.  Head is normocephalic, atraumatic.  No labored breathing.  Speech is clear and coherent with logical content.  Patient is alert and oriented at baseline.    Assessment and Plan:  1. Hidradenitis suppurativa of right axilla (Primary)  - doxycycline  (VIBRA -TABS) 100 MG tablet; Take 1 tablet (100 mg total) by mouth 2 (two) times daily for 7 days.  Dispense: 14 tablet; Refill: 0   2. Nausea and vomiting, unspecified vomiting type   -keep area clean -follow up in person if not improving in net 24-48 hours -hydrate and bland diet for n/v   Reviewed side effects, risks and benefits of medication.    Patient acknowledged agreement and understanding of the plan.   Past Medical, Surgical, Social History, Allergies, and Medications have been Reviewed.     Follow Up Instructions: I  discussed the assessment and treatment plan with the patient. The patient was provided an opportunity to ask questions and all were answered. The patient agreed with the plan and demonstrated an understanding of the instructions.  A copy of instructions were sent to the patient via MyChart unless otherwise noted below.    The patient was advised to call back or seek an in-person evaluation if the symptoms worsen or if the condition fails to improve as anticipated.    Chiquita CHRISTELLA Barefoot, NP

## 2024-01-08 NOTE — Patient Instructions (Addendum)
 [facility], thank you for joining Chiquita CHRISTELLA Barefoot, NP for today's virtual visit.  While this provider is not your primary care provider (PCP), if your PCP is located in our provider database this encounter information will be shared with them immediately following your visit.   A [facility] MyChart account gives you access to today's visit and all your visits, tests, and labs performed at [facility]  click here if you don't have a [facility] MyChart account or go to [email or web address]  Consent: (Patient) Kristen Haney provided verbal consent for this virtual visit at the beginning of the encounter.  Current Medications:  Current Outpatient Medications:    doxycycline  (VIBRA -TABS) 100 MG tablet, Take 1 tablet (100 mg total) by mouth 2 (two) times daily for 7 days., Disp: 14 tablet, Rfl: 0   albuterol  (VENTOLIN  HFA) 108 (90 Base) MCG/ACT inhaler, Inhale 1-2 puffs into the lungs every 6 (six) hours as needed for wheezing or shortness of breath., Disp: 6.7 g, Rfl: 0   benzoyl peroxide  10 % LIQD, Apply 1 Application topically daily., Disp: 227 g, Rfl: 5   buPROPion  (WELLBUTRIN  XL) 300 MG 24 hr tablet, Take 1 tablet (300 mg total) by mouth daily., Disp: 90 tablet, Rfl: 1   clindamycin  (CLINDAGEL) 1 % gel, Apply topically 2 (two) times daily. Use with the benzoyl peroxide  gel., Disp: 30 g, Rfl: 5   cyclobenzaprine  (FLEXERIL ) 10 MG tablet, Take 1 tablet (10 mg total) by mouth 3 (three) times daily as needed for muscle spasms., Disp: 30 tablet, Rfl: 0   Erenumab -aooe (AIMOVIG ) 140 MG/ML SOAJ, Inject 140 mg into the skin every 28 (twenty-eight) days., Disp: 1.12 mL, Rfl: 11   gabapentin  (NEURONTIN ) 300 MG capsule, Take 1 capsule (300 mg total) by mouth 2 (two) times daily., Disp: 180 capsule, Rfl: 0   hydrOXYzine  (ATARAX ) 50 MG tablet, Take 2 tablets (100 mg total) by mouth at bedtime., Disp: 180 tablet, Rfl: 1   hydrOXYzine  (VISTARIL ) 25 MG capsule, Take 1-2 capsules (25-50 mg  total) by mouth every 8 (eight) hours as needed. Take 30-45 minutes before donating plasma for anxiety (Patient not taking: Reported on 11/06/2023), Disp: 30 capsule, Rfl: 0   lamoTRIgine  (LAMICTAL ) 25 MG tablet, Take 2 tablets (50 mg total) by mouth at bedtime., Disp: 60 tablet, Rfl: 2   nystatin  cream (MYCOSTATIN ), APPLY TO THE AFFECTED AREA(S) 2 TIMES A DAY, Disp: 30 g, Rfl: 0   nystatin -triamcinolone  ointment (MYCOLOG), APPLY TO THE AFFECTED AREA(S) TWO TIMES A DAY FOR 14 DAYS, Disp: 30 g, Rfl: 2   promethazine  (PHENERGAN ) 25 MG tablet, Take 1 tablet (25 mg total) by mouth every 8 (eight) hours as needed for nausea or vomiting., Disp: 15 tablet, Rfl: 5   Rimegepant Sulfate (NURTEC) 75 MG TBDP, PLACE 1 TABLET BY MOUTH DAILY AS NEEDED (Patient not taking: Reported on 11/06/2023), Disp: 8 tablet, Rfl: 5   rizatriptan  (MAXALT -MLT) 10 MG disintegrating tablet, Take 1 tablet (10 mg total) by mouth as needed for migraine. May repeat in 2 hours if needed.  Maximum 2 tablets in 24 hours., Disp: 10 tablet, Rfl: 5   sildenafil  (VIAGRA ) 50 MG tablet, Take 0.5 tablets (25 mg total) by mouth daily as needed for erectile dysfunction., Disp: 10 tablet, Rfl: 5   Medications ordered in this encounter:  Meds ordered this encounter  Medications   doxycycline  (VIBRA -TABS) 100 MG tablet    Sig: Take 1 tablet (100 mg total) by mouth 2 (two) times daily for 7 days.  Dispense:  14 tablet    Refill:  0    Supervising Provider:   BLAISE ALEENE KIDD [[id number]]     *If you need refills on other medications prior to your next appointment, please contact your pharmacy*  Follow-Up: Call back or seek an in-person evaluation if the symptoms worsen or if the condition fails to improve as anticipated.  [facility] [phone]  Other Instructions -keep area clean -follow up in person if not improving in net 24-48 hours -hydrate and bland diet for n/v   If you have been instructed to have an in-person  evaluation today at a local Urgent Care facility, please use the link below. It will take you to a list of all of our available [facility], including address, phone number and hours of operation. Please do not delay care.  [facility]  If you or a family member do not have a primary care provider, use the link below to schedule a visit and establish care. When you choose a [facility] primary care physician or advanced practice provider, you gain a long-term partner in health. Find a Primary Care Provider  Learn more about [facility]'s in-office and virtual care options: [facility] - Get Care Now

## 2024-02-05 ENCOUNTER — Other Ambulatory Visit: Payer: Self-pay | Admitting: Family Medicine

## 2024-02-05 DIAGNOSIS — G43801 Other migraine, not intractable, with status migrainosus: Secondary | ICD-10-CM

## 2024-02-20 ENCOUNTER — Telehealth: Payer: PRIVATE HEALTH INSURANCE | Admitting: Physician Assistant

## 2024-02-20 DIAGNOSIS — R3989 Other symptoms and signs involving the genitourinary system: Secondary | ICD-10-CM | POA: Diagnosis not present

## 2024-02-20 MED ORDER — PHENAZOPYRIDINE HCL 100 MG PO TABS: 100.0000 mg | ORAL_TABLET | Freq: Three times a day (TID) | ORAL | 0 refills | Status: AC | PRN

## 2024-02-20 MED ORDER — SULFAMETHOXAZOLE-TRIMETHOPRIM 800-160 MG PO TABS: 1.0000 | ORAL_TABLET | Freq: Two times a day (BID) | ORAL | 0 refills | Status: AC

## 2024-02-20 NOTE — Patient Instructions (Signed)
Gomez Cleverly, thank you for joining Margaretann Loveless, PA-C for today's virtual visit.  While this provider is not your primary care provider (PCP), if your PCP is located in our provider database this encounter information will be shared with them immediately following your visit.   A [facility] MyChart account gives you access to today's visit and all your visits, tests, and labs performed at [facility] " click here if you don't have a [facility] MyChart account or go to [email or web address]  Consent: (Patient) Kristen Haney provided verbal consent for this virtual visit at the beginning of the encounter.  Current Medications:  Current Outpatient Medications:    phenazopyridine (PYRIDIUM) 100 MG tablet, Take 1 tablet (100 mg total) by mouth 3 (three) times daily as needed for pain., Disp: 10 tablet, Rfl: 0   sulfamethoxazole-trimethoprim (BACTRIM DS) 800-160 MG tablet, Take 1 tablet by mouth 2 (two) times daily., Disp: 10 tablet, Rfl: 0   albuterol (VENTOLIN HFA) 108 (90 Base) MCG/ACT inhaler, Inhale 1-2 puffs into the lungs every 6 (six) hours as needed for wheezing or shortness of breath., Disp: 6.7 g, Rfl: 0   benzoyl peroxide 10 % LIQD, Apply 1 Application topically daily., Disp: 227 g, Rfl: 5   buPROPion (WELLBUTRIN XL) 300 MG 24 hr tablet, Take 1 tablet (300 mg total) by mouth daily., Disp: 90 tablet, Rfl: 1   clindamycin (CLINDAGEL) 1 % gel, Apply topically 2 (two) times daily. Use with the benzoyl peroxide gel., Disp: 30 g, Rfl: 5   cyclobenzaprine (FLEXERIL) 10 MG tablet, Take 1 tablet (10 mg total) by mouth 3 (three) times daily as needed for muscle spasms., Disp: 30 tablet, Rfl: 0   Erenumab-aooe (AIMOVIG) 140 MG/ML SOAJ, Inject 140 mg into the skin every 28 (twenty-eight) days., Disp: 1.12 mL, Rfl: 11   gabapentin (NEURONTIN) 300 MG capsule, Take 1 capsule (300 mg total) by mouth 2 (two) times daily., Disp: 180 capsule, Rfl: 0   hydrOXYzine (ATARAX) 50 MG  tablet, Take 2 tablets (100 mg total) by mouth at bedtime., Disp: 180 tablet, Rfl: 1   hydrOXYzine (VISTARIL) 25 MG capsule, Take 1-2 capsules (25-50 mg total) by mouth every 8 (eight) hours as needed. Take 30-45 minutes before donating plasma for anxiety (Patient not taking: Reported on 11/06/2023), Disp: 30 capsule, Rfl: 0   lamoTRIgine (LAMICTAL) 25 MG tablet, Take 2 tablets (50 mg total) by mouth at bedtime., Disp: 60 tablet, Rfl: 2   nystatin cream (MYCOSTATIN), APPLY TO THE AFFECTED AREA(S) 2 TIMES A DAY, Disp: 30 g, Rfl: 0   nystatin-triamcinolone ointment (MYCOLOG), APPLY TO THE AFFECTED AREA(S) TWO TIMES A DAY FOR 14 DAYS, Disp: 30 g, Rfl: 2   promethazine (PHENERGAN) 25 MG tablet, Take 1 tablet (25 mg total) by mouth every 8 (eight) hours as needed for nausea or vomiting., Disp: 15 tablet, Rfl: 5   Rimegepant Sulfate (NURTEC) 75 MG TBDP, PLACE 1 TABLET BY MOUTH DAILY AS NEEDED (Patient not taking: Reported on 11/06/2023), Disp: 8 tablet, Rfl: 5   rizatriptan (MAXALT-MLT) 10 MG disintegrating tablet, Take 1 tablet (10 mg total) by mouth as needed for migraine. May repeat in 2 hours if needed.  Maximum 2 tablets in 24 hours., Disp: 10 tablet, Rfl: 5   sildenafil (VIAGRA) 50 MG tablet, Take 0.5 tablets (25 mg total) by mouth daily as needed for erectile dysfunction., Disp: 10 tablet, Rfl: 5   Medications ordered in this encounter:  Meds ordered this encounter  Medications   sulfamethoxazole-trimethoprim (  BACTRIM DS) 800-160 MG tablet    Sig: Take 1 tablet by mouth 2 (two) times daily.    Dispense:  10 tablet    Refill:  0    Supervising Provider:   Merrilee Jansky [[id number]]   phenazopyridine (PYRIDIUM) 100 MG tablet    Sig: Take 1 tablet (100 mg total) by mouth 3 (three) times daily as needed for pain.    Dispense:  10 tablet    Refill:  0    Supervising Provider:   Merrilee Jansky [[id number]]     *If you need refills on other medications prior to your next appointment, please contact  your pharmacy*  Follow-Up: Call back or seek an in-person evaluation if the symptoms worsen or if the condition fails to improve as anticipated.  [facility] [phone]  Other Instructions  Urinary Tract Infection, Female A urinary tract infection (UTI) is an infection in your urinary tract. The urinary tract is made up of organs that make, store, and get rid of pee (urine) in your body. These organs include: The kidneys. The ureters. The bladder. The urethra. What are the causes? Most UTIs are caused by germs called bacteria. They may be in or near your genitals. These germs grow and cause swelling in your urinary tract. What increases the risk? You're more likely to get a UTI if: You're a female. The urethra is shorter in females than in males. You have a soft tube called a catheter that drains your pee. You can't control when you pee or poop. You have trouble peeing because of: A kidney stone. A urinary blockage. A nerve condition that affects your bladder. Not getting enough to drink. You're sexually active. You use a birth control inside your vagina, like spermicide. You're pregnant. You have low levels of the hormone estrogen in your body. You're an older adult. You're also more likely to get a UTI if you have other health problems. These may include: Diabetes. A weak immune system. Your immune system is your body's defense system. Sickle cell disease. Injury of the spine. What are the signs or symptoms? Symptoms may include: Needing to pee right away. Peeing small amounts often. Pain or burning when you pee. Blood in your pee. Pee that smells bad or odd. Pain in your belly or lower back. You may also: Feel confused. This may be the first symptom in older adults. Vomit. Not feel hungry. Feel tired or easily annoyed. Have a fever or chills. How is this diagnosed? A UTI is diagnosed based on your medical history and an exam. You may also have  other tests. These may include: Pee tests. Blood tests. Tests for sexually transmitted infections (STIs). If you've had more than one UTI, you may need to have imaging studies done to find out why you keep getting them. How is this treated? A UTI can be treated by: Taking antibiotics or other medicines. Drinking enough fluid to keep your pee pale yellow. In rare cases, a UTI can cause a very bad condition called sepsis. Sepsis may be treated in the hospital. Follow these instructions at home: Medicines Take your medicines only as told by your health care provider. If you were given antibiotics, take them as told by your provider. Do not stop taking them even if you start to feel better. General instructions Make sure you: Pee often and fully. Do not hold your pee for a long time. Wipe from front to back after you pee or poop.  Use each tissue only once when you wipe. Pee after you have sex. Do not douche or use sprays or powders in your genital area. Contact a health care provider if: Your symptoms don't get better after 1-2 days of taking antibiotics. Your symptoms go away and then come back. You have a fever or chills. You vomit or feel like you may vomit. Get help right away if: You have very bad pain in your back or lower belly. You faint. This information is not intended to replace advice given to you by your health care provider. Make sure you discuss any questions you have with your health care provider. Document Revised: 07/25/2023 Document Reviewed: 03/22/2023 Elsevier Patient Education  2024 Elsevier [employer].   If you have been instructed to have an in-person evaluation today at a local Urgent Care facility, please use the link below. It will take you to a list of all of our available [facility], including address, phone number and hours of operation. Please do not delay care.  [facility]  If you or a family member do not have a primary care  provider, use the link below to schedule a visit and establish care. When you choose a [facility] primary care physician or advanced practice provider, you gain a long-term partner in health. Find a Primary Care Provider  Learn more about [facility]'s in-office and virtual care options: [facility] - Get Care Now

## 2024-02-20 NOTE — Progress Notes (Signed)
Virtual Visit Consent   Kristen Haney, you are scheduled for a virtual visit with a [facility] provider today. Just as with appointments in the office, your consent must be obtained to participate. Your consent will be active for this visit and any virtual visit you may have with one of our providers in the next 365 days. If you have a MyChart account, a copy of this consent can be sent to you electronically.  As this is a virtual visit, video technology does not allow for your provider to perform a traditional examination. This may limit your provider's ability to fully assess your condition. If your provider identifies any concerns that need to be evaluated in person or the need to arrange testing (such as labs, EKG, etc.), we will make arrangements to do so. Although advances in technology are sophisticated, we cannot ensure that it will always work on either your end or our end. If the connection with a video visit is poor, the visit may have to be switched to a telephone visit. With either a video or telephone visit, we are not always able to ensure that we have a secure connection.  By engaging in this virtual visit, you consent to the provision of healthcare and authorize for your insurance to be billed (if applicable) for the services provided during this visit. Depending on your insurance coverage, you may receive a charge related to this service.  I need to obtain your verbal consent now. Are you willing to proceed with your visit today? Kristen Haney has provided verbal consent on 02/20/2024 for a virtual visit (video or telephone). Kristen Loveless, PA-C  Date: 02/20/2024 9:22 AM   Virtual Visit via Video Note   I, Kristen Haney, connected with  Kristen Haney  ([id number], 05/29/1991) on 02/20/24 at  9:15 AM EST by a video-enabled telemedicine application and verified that I am speaking with the correct person using two identifiers.  Location: Patient: Virtual Visit Location  Patient: Home Provider: Virtual Visit Location Provider: Home Office   I discussed the limitations of evaluation and management by telemedicine and the availability of in person appointments. The patient expressed understanding and agreed to proceed.    History of Present Illness: Kristen Haney is a 33 y.o. who identifies as a female who was assigned female at birth, and is being seen today for UTI .  HPI: Urinary Tract Infection  This is a new problem. The current episode started yesterday. The problem occurs every urination. The problem has been gradually worsening. The quality of the pain is described as burning. The pain is mild. There has been no fever. Associated symptoms include flank pain (right), frequency, hesitancy and urgency. Pertinent negatives include no chills, hematuria, nausea or vomiting. Associated symptoms comments: Strong odor to urine. She has tried increased fluids and NSAIDs (ibuprofen) for the symptoms. The treatment provided no relief. Her past medical history is significant for recurrent UTIs.     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   Intentional drug overdose (HCC)    Sciatica 02/03/2018   Sickle cell trait (HCC)    Migraines 06/07/1998  Allergies: No Known Allergies Medications:  Current Outpatient Medications:    phenazopyridine (PYRIDIUM) 100 MG tablet, Take 1 tablet (100 mg total) by mouth 3 (three) times daily as needed for pain., Disp: 10 tablet, Rfl: 0   sulfamethoxazole-trimethoprim (BACTRIM DS) 800-160 MG tablet, Take 1 tablet by mouth 2 (two) times  daily., Disp: 10 tablet, Rfl: 0   albuterol (VENTOLIN HFA) 108 (90 Base) MCG/ACT inhaler, Inhale 1-2 puffs into the lungs every 6 (six) hours as needed for wheezing or shortness of breath., Disp: 6.7 g, Rfl: 0   benzoyl peroxide 10 % LIQD, Apply 1 Application topically daily., Disp: 227 g, Rfl: 5   buPROPion (WELLBUTRIN XL) 300 MG 24 hr tablet, Take 1 tablet (300 mg total) by mouth daily., Disp: 90 tablet, Rfl: 1   clindamycin (CLINDAGEL) 1 % gel, Apply topically 2 (two) times daily. Use with the benzoyl peroxide gel., Disp: 30 g, Rfl: 5   cyclobenzaprine (FLEXERIL) 10 MG tablet, Take 1 tablet (10 mg total) by mouth 3 (three) times daily as needed for muscle spasms., Disp: 30 tablet, Rfl: 0   Erenumab-aooe (AIMOVIG) 140 MG/ML SOAJ, Inject 140 mg into the skin every 28 (twenty-eight) days., Disp: 1.12 mL, Rfl: 11   gabapentin (NEURONTIN) 300 MG capsule, Take 1 capsule (300 mg total) by mouth 2 (two) times daily., Disp: 180 capsule, Rfl: 0   hydrOXYzine (ATARAX) 50 MG tablet, Take 2 tablets (100 mg total) by mouth at bedtime., Disp: 180 tablet, Rfl: 1   hydrOXYzine (VISTARIL) 25 MG capsule, Take 1-2 capsules (25-50 mg total) by mouth every 8 (eight) hours as needed. Take 30-45 minutes before donating plasma for anxiety (Patient not taking: Reported on 11/06/2023), Disp: 30 capsule, Rfl: 0   lamoTRIgine (LAMICTAL) 25 MG tablet, Take 2 tablets (50 mg total) by mouth at bedtime., Disp: 60 tablet, Rfl: 2   nystatin cream (MYCOSTATIN), APPLY TO THE AFFECTED AREA(S) 2 TIMES A DAY, Disp: 30 g, Rfl: 0   nystatin-triamcinolone ointment (MYCOLOG), APPLY TO THE AFFECTED AREA(S) TWO TIMES A DAY FOR 14 DAYS, Disp: 30 g, Rfl: 2   promethazine (PHENERGAN) 25 MG tablet, Take 1 tablet (25 mg total) by mouth every 8 (eight) hours as needed for nausea or vomiting., Disp: 15 tablet, Rfl: 5   Rimegepant Sulfate (NURTEC) 75 MG TBDP, PLACE 1 TABLET BY MOUTH DAILY AS NEEDED (Patient not taking: Reported on 11/06/2023), Disp: 8  tablet, Rfl: 5   rizatriptan (MAXALT-MLT) 10 MG disintegrating tablet, Take 1 tablet (10 mg total) by mouth as needed for migraine. May repeat in 2 hours if needed.  Maximum 2 tablets in 24 hours., Disp: 10 tablet, Rfl: 5   sildenafil (VIAGRA) 50 MG tablet, Take 0.5 tablets (25 mg total) by mouth daily as needed for erectile dysfunction., Disp: 10 tablet, Rfl: 5  Observations/Objective: Patient is well-developed, well-nourished in no acute distress.  Resting comfortably at home.  Head is normocephalic, atraumatic.  No labored breathing.  Speech is clear and coherent with logical content.  Patient is alert and oriented at baseline.    Assessment and Plan: 1. Suspected UTI (Primary) - sulfamethoxazole-trimethoprim (BACTRIM DS) 800-160 MG tablet; Take 1 tablet by mouth 2 (two) times daily.  Dispense: 10 tablet; Refill: 0 - phenazopyridine (PYRIDIUM) 100 MG tablet; Take 1 tablet (100 mg total) by mouth 3 (three) times daily as needed for pain.  Dispense: 10 tablet; Refill: 0  - Worsening symptoms.  - Will treat empirically with Bactrim - May use Pyridium for bladder  spasms - Continue to push fluids.  - Seek in person evaluation for urine culture if symptoms do not improve or if they worsen.    Follow Up Instructions: I discussed the assessment and treatment plan with the patient. The patient was provided an opportunity to ask questions and all were answered. The patient agreed with the plan and demonstrated an understanding of the instructions.  A copy of instructions were sent to the patient via MyChart unless otherwise noted below.    The patient was advised to call back or seek an in-person evaluation if the symptoms worsen or if the condition fails to improve as anticipated.    Kristen Loveless, PA-C

## 2024-03-24 ENCOUNTER — Encounter (HOSPITAL_BASED_OUTPATIENT_CLINIC_OR_DEPARTMENT_OTHER): Payer: Self-pay

## 2024-03-24 ENCOUNTER — Emergency Department (HOSPITAL_BASED_OUTPATIENT_CLINIC_OR_DEPARTMENT_OTHER)
Admission: EM | Admit: 2024-03-24 | Discharge: 2024-03-24 | Disposition: A | Discharge status: Home / Self Care | Payer: PRIVATE HEALTH INSURANCE | Attending: Emergency Medicine | Admitting: Emergency Medicine

## 2024-03-24 ENCOUNTER — Other Ambulatory Visit: Payer: Self-pay

## 2024-03-24 DIAGNOSIS — F439 Reaction to severe stress, unspecified: Secondary | ICD-10-CM | POA: Insufficient documentation

## 2024-03-24 DIAGNOSIS — N3000 Acute cystitis without hematuria: Secondary | ICD-10-CM | POA: Insufficient documentation

## 2024-03-24 DIAGNOSIS — L02412 Cutaneous abscess of left axilla: Secondary | ICD-10-CM | POA: Insufficient documentation

## 2024-03-24 DIAGNOSIS — L02419 Cutaneous abscess of limb, unspecified: Secondary | ICD-10-CM

## 2024-03-24 LAB — COMPREHENSIVE METABOLIC PANEL
ALT: 12 U/L (ref 0–44)
AST: 15 U/L (ref 15–41)
Albumin: 4.1 g/dL (ref 3.5–5.0)
Alkaline Phosphatase: 58 U/L (ref 38–126)
Anion gap: 7 (ref 5–15)
BUN: 9 mg/dL (ref 6–20)
CO2: 28 mmol/L (ref 22–32)
Calcium: 8.9 mg/dL (ref 8.9–10.3)
Chloride: 101 mmol/L (ref 98–111)
Creatinine, Ser: 0.62 mg/dL (ref 0.44–1.00)
GFR, Estimated: >60 mL/min (ref >60)
Glucose, Bld: 86 mg/dL (ref 70–99)
Potassium: 3.8 mmol/L (ref 3.5–5.1)
Sodium: 136 mmol/L (ref 135–145)
Total Bilirubin: 0.4 mg/dL (ref 0.0–1.2)
Total Protein: 7.4 g/dL (ref 6.5–8.1)

## 2024-03-24 LAB — CBC WITH DIFFERENTIAL/PLATELET
Abs Immature Granulocytes: 0.01 10*3/uL (ref 0.00–0.07)
Basophils Absolute: 0 10*3/uL (ref 0.0–0.1)
Basophils Relative: 1 %
Eosinophils Absolute: 0.1 10*3/uL (ref 0.0–0.5)
Eosinophils Relative: 2 %
HCT: 36.9 % (ref 36.0–46.0)
Hemoglobin: 12.3 g/dL (ref 12.0–15.0)
Immature Granulocytes: 0 %
Lymphocytes Relative: 23 %
Lymphs Abs: 1.4 10*3/uL (ref 0.7–4.0)
MCH: 27.8 pg (ref 26.0–34.0)
MCHC: 33.3 g/dL (ref 30.0–36.0)
MCV: 83.5 fL (ref 80.0–100.0)
Monocytes Absolute: 0.6 10*3/uL (ref 0.1–1.0)
Monocytes Relative: 9 %
Neutro Abs: 3.9 10*3/uL (ref 1.7–7.7)
Neutrophils Relative %: 65 %
Platelets: 326 10*3/uL (ref 150–400)
RBC: 4.42 MIL/uL (ref 3.87–5.11)
RDW: 13.8 % (ref 11.5–15.5)
WBC: 5.9 10*3/uL (ref 4.0–10.5)
nRBC: 0 % (ref 0.0–0.2)

## 2024-03-24 LAB — URINALYSIS, ROUTINE W REFLEX MICROSCOPIC
Bilirubin Urine: NEGATIVE
Glucose, UA: NEGATIVE mg/dL
Hgb urine dipstick: NEGATIVE
Ketones, ur: NEGATIVE mg/dL
Nitrite: POSITIVE — AB
Protein, ur: NEGATIVE mg/dL
Specific Gravity, Urine: 1.012 (ref 1.005–1.030)
pH: 6 (ref 5.0–8.0)

## 2024-03-24 LAB — PREGNANCY, URINE: Preg Test, Ur: NEGATIVE

## 2024-03-24 MED ORDER — CEPHALEXIN 500 MG PO CAPS: 500.0000 mg | ORAL_CAPSULE | Freq: Four times a day (QID) | ORAL | 0 refills | Status: DC

## 2024-03-24 MED ORDER — LIDOCAINE-EPINEPHRINE (PF) 2 %-1:200000 IJ SOLN
10.0000 mL | Freq: Once | INTRAMUSCULAR | Status: AC
  Administered 2024-03-24: 10 mL via INTRADERMAL
  Filled 2024-03-24: qty 20

## 2024-03-24 MED ORDER — LORAZEPAM 2 MG/ML IJ SOLN
1.0000 mg | Freq: Once | INTRAMUSCULAR | Status: AC
  Administered 2024-03-24: 1 mg via INTRAVENOUS
  Filled 2024-03-24: qty 1

## 2024-03-24 MED ORDER — HYDROCODONE-ACETAMINOPHEN 5-325 MG PO TABS: 1.0000 | ORAL_TABLET | ORAL | 0 refills | Status: DC | PRN

## 2024-03-24 MED ORDER — SODIUM CHLORIDE 0.9 % IV BOLUS
1000.0000 mL | Freq: Once | INTRAVENOUS | Status: AC
  Administered 2024-03-24: 1000 mL via INTRAVENOUS

## 2024-03-24 NOTE — ED Provider Notes (Signed)
 [place] EMERGENCY DEPARTMENT AT [facility] Provider Note   CSN: [id number] Arrival date & time: 03/24/24  1351     History  No chief complaint on file.   Kristen Haney is a 33 y.o. female.  Patient to ED with multiple complaints but essentially complains of generalized fatigue and exhaustion, nausea and loose stool. She reports losing her job about 10 days ago and feels it is likely that she is experiencing stress and anxiety over this. No fever. No significant pain, headache. She also reports a painful swelling in her left axilla c/w previous multiple abscesses.   The history is provided by the patient. No language interpreter was used.       Home Medications Prior to Admission medications   Medication Sig Start Date End Date Taking? Authorizing Provider  cephALEXin (KEFLEX) 500 MG capsule Take 1 capsule (500 mg total) by mouth 4 (four) times daily. 03/24/24  Yes Elpidio Anis, PA-C  HYDROcodone-acetaminophen (NORCO/VICODIN) 5-325 MG tablet Take 1 tablet by mouth every 4 (four) hours as needed for severe pain (pain score 7-10). 03/24/24  Yes Ethal Gotay, PA-C  albuterol (VENTOLIN HFA) 108 (90 Base) MCG/ACT inhaler Inhale 1-2 puffs into the lungs every 6 (six) hours as needed for wheezing or shortness of breath. 08/06/21   Haskel Schroeder, PA-C  benzoyl peroxide 10 % LIQD Apply 1 Application topically daily. 04/03/23   Karie Georges, MD  buPROPion (WELLBUTRIN XL) 300 MG 24 hr tablet Take 1 tablet (300 mg total) by mouth daily. 05/24/23   Karie Georges, MD  clindamycin (CLINDAGEL) 1 % gel Apply topically 2 (two) times daily. Use with the benzoyl peroxide gel. 10/15/23   Karie Georges, MD  cyclobenzaprine (FLEXERIL) 10 MG tablet Take 1 tablet (10 mg total) by mouth 3 (three) times daily as needed for muscle spasms. 12/11/23   [name], FNP  Erenumab-aooe (AIMOVIG) 140 MG/ML SOAJ Inject 140 mg into the skin every 28 (twenty-eight) days. 11/06/23   Drema Dallas, DO  gabapentin (NEURONTIN) 300 MG capsule Take 1 capsule (300 mg total) by mouth 2 (two) times daily. 10/28/23   Karie Georges, MD  hydrOXYzine (ATARAX) 50 MG tablet Take 2 tablets (100 mg total) by mouth at bedtime. 02/15/23   Karie Georges, MD  hydrOXYzine (VISTARIL) 25 MG capsule Take 1-2 capsules (25-50 mg total) by mouth every 8 (eight) hours as needed. Take 30-45 minutes before donating plasma for anxiety Patient not taking: Reported on 11/06/2023 03/15/23   Karie Georges, MD  lamoTRIgine (LAMICTAL) 25 MG tablet Take 2 tablets (50 mg total) by mouth at bedtime. 10/30/23   Karie Georges, MD  nystatin cream (MYCOSTATIN) APPLY TO THE AFFECTED AREA(S) 2 TIMES A DAY 07/10/23   Karie Georges, MD  nystatin-triamcinolone ointment (MYCOLOG) APPLY TO THE AFFECTED AREA(S) TWO TIMES A DAY FOR 14 DAYS 02/05/23   Karie Georges, MD  phenazopyridine (PYRIDIUM) 100 MG tablet Take 1 tablet (100 mg total) by mouth 3 (three) times daily as needed for pain. 02/20/24   Margaretann Loveless, PA-C  promethazine (PHENERGAN) 25 MG tablet Take 1 tablet (25 mg total) by mouth every 8 (eight) hours as needed for nausea or vomiting. 11/06/23   Drema Dallas, DO  Rimegepant Sulfate (NURTEC) 75 MG TBDP PLACE 1 TABLET BY MOUTH DAILY AS NEEDED Patient not taking: Reported on 11/06/2023 04/02/23   Karie Georges, MD  rizatriptan (MAXALT-MLT) 10 MG disintegrating tablet Take 1 tablet (  10 mg total) by mouth as needed for migraine. May repeat in 2 hours if needed.  Maximum 2 tablets in 24 hours. 11/06/23   Drema Dallas, DO  sildenafil (VIAGRA) 50 MG tablet Take 0.5 tablets (25 mg total) by mouth daily as needed for erectile dysfunction. 06/11/23   Karie Georges, MD  sulfamethoxazole-trimethoprim (BACTRIM DS) 800-160 MG tablet Take 1 tablet by mouth 2 (two) times daily. 02/20/24   Margaretann Loveless, PA-C      Allergies    Patient has no known allergies.    Review of Systems   Review of  Systems  Physical Exam Updated Vital Signs BP (!) 131/102   Pulse 83   Temp 98.5 F (36.9 C) (Oral)   Resp 20   Ht 5\' 7"  (1.702 m)   Wt 105.2 kg   LMP 03/09/2024 (Approximate)   SpO2 100%   Breastfeeding No   BMI 36.32 kg/m  Physical Exam Vitals and nursing note reviewed.  Constitutional:      Appearance: She is well-developed.  HENT:     Head: Normocephalic.  Cardiovascular:     Rate and Rhythm: Normal rate and regular rhythm.     Heart sounds: No murmur heard. Pulmonary:     Effort: Pulmonary effort is normal.     Breath sounds: Normal breath sounds. No wheezing, rhonchi or rales.  Abdominal:     Palpations: Abdomen is soft.     Tenderness: There is no abdominal tenderness. There is no guarding or rebound.  Musculoskeletal:        General: Normal range of motion.     Cervical back: Normal range of motion and neck supple.  Skin:    General: Skin is warm and dry.     Comments: Left axillary nondraining abscess.  Neurological:     General: No focal deficit present.     Mental Status: She is alert and oriented to person, place, and time.     ED Results / Procedures / Treatments   Labs (all labs ordered are listed, but only abnormal results are displayed) Labs Reviewed  URINALYSIS, ROUTINE W REFLEX MICROSCOPIC - Abnormal; Notable for the following components:      Result Value   Nitrite POSITIVE (*)    Leukocytes,Ua TRACE (*)    Bacteria, UA MANY (*)    All other components within normal limits  CBC WITH DIFFERENTIAL/PLATELET  COMPREHENSIVE METABOLIC PANEL  PREGNANCY, URINE   Results for orders placed or performed during the hospital encounter of 03/24/24  CBC with Differential   Collection Time: 03/24/24  4:13 PM  Result Value Ref Range   WBC 5.9 4.0 - 10.5 K/uL   RBC 4.42 3.87 - 5.11 MIL/uL   Hemoglobin 12.3 12.0 - 15.0 g/dL   HCT 40.9 81.1 - 91.4 %   MCV 83.5 80.0 - 100.0 fL   MCH 27.8 26.0 - 34.0 pg   MCHC 33.3 30.0 - 36.0 g/dL   RDW 78.2 95.6 -  21.3 %   Platelets 326 150 - 400 K/uL   nRBC 0.0 0.0 - 0.2 %   Neutrophils Relative % 65 %   Neutro Abs 3.9 1.7 - 7.7 K/uL   Lymphocytes Relative 23 %   Lymphs Abs 1.4 0.7 - 4.0 K/uL   Monocytes Relative 9 %   Monocytes Absolute 0.6 0.1 - 1.0 K/uL   Eosinophils Relative 2 %   Eosinophils Absolute 0.1 0.0 - 0.5 K/uL   Basophils Relative 1 %  Basophils Absolute 0.0 0.0 - 0.1 K/uL   Immature Granulocytes 0 %   Abs Immature Granulocytes 0.01 0.00 - 0.07 K/uL  Comprehensive metabolic panel   Collection Time: 03/24/24  4:13 PM  Result Value Ref Range   Sodium 136 135 - 145 mmol/L   Potassium 3.8 3.5 - 5.1 mmol/L   Chloride 101 98 - 111 mmol/L   CO2 28 22 - 32 mmol/L   Glucose, Bld 86 70 - 99 mg/dL   BUN 9 6 - 20 mg/dL   Creatinine, Ser 1.61 0.44 - 1.00 mg/dL   Calcium 8.9 8.9 - 09.6 mg/dL   Total Protein 7.4 6.5 - 8.1 g/dL   Albumin 4.1 3.5 - 5.0 g/dL   AST 15 15 - 41 U/L   ALT 12 0 - 44 U/L   Alkaline Phosphatase 58 38 - 126 U/L   Total Bilirubin 0.4 0.0 - 1.2 mg/dL   GFR, Estimated >04 >54 mL/min   Anion gap 7 5 - 15  Urinalysis, Routine w reflex microscopic -Urine, Clean Catch   Collection Time: 03/24/24  4:43 PM  Result Value Ref Range   Color, Urine YELLOW YELLOW   APPearance CLEAR CLEAR   Specific Gravity, Urine 1.012 1.005 - 1.030   pH 6.0 5.0 - 8.0   Glucose, UA NEGATIVE NEGATIVE mg/dL   Hgb urine dipstick NEGATIVE NEGATIVE   Bilirubin Urine NEGATIVE NEGATIVE   Ketones, ur NEGATIVE NEGATIVE mg/dL   Protein, ur NEGATIVE NEGATIVE mg/dL   Nitrite POSITIVE (A) NEGATIVE   Leukocytes,Ua TRACE (A) NEGATIVE   RBC / HPF 0-5 0 - 5 RBC/hpf   WBC, UA 11-20 0 - 5 WBC/hpf   Bacteria, UA MANY (A) NONE SEEN   Squamous Epithelial / HPF 0-5 0 - 5 /HPF   Mucus PRESENT   Pregnancy, urine   Collection Time: 03/24/24  4:43 PM  Result Value Ref Range   Preg Test, Ur NEGATIVE NEGATIVE    EKG None  Radiology No results found.  Procedures .Incision and  Drainage  Date/Time: 03/24/2024 6:01 PM  Performed by: Elpidio Anis, PA-C Authorized by: Elpidio Anis, PA-C   Consent:    Consent obtained:  Verbal Universal protocol:    Procedure explained and questions answered to patient or proxy's satisfaction: yes     Patient identity confirmed:  Verbally with patient Location:    Type:  Abscess   Location:  Upper extremity   Upper extremity location:  Arm   Arm location: left axilla. Pre-procedure details:    Skin preparation:  Povidone-iodine Sedation:    Sedation type:  Anxiolysis Anesthesia:    Anesthesia method:  Local infiltration   Local anesthetic:  Lidocaine 2% WITH epi Procedure type:    Complexity:  Simple Procedure details:    Incision types:  Stab incision   Wound management:  Probed and deloculated   Drainage:  Purulent and bloody   Drainage amount:  Moderate   Wound treatment:  Wound left open Post-procedure details:    Procedure completion:  Tolerated well, no immediate complications     Medications Ordered in ED Medications  sodium chloride 0.9 % bolus 1,000 mL (0 mLs Intravenous Stopped 03/24/24 1714)  lidocaine-EPINEPHrine (XYLOCAINE W/EPI) 2 %-1:200000 (PF) injection 10 mL (10 mLs Intradermal Given by Other 03/24/24 1714)  LORazepam (ATIVAN) injection 1 mg (1 mg Intravenous Given 03/24/24 1715)    ED Course/ Medical Decision Making/ A&P  Medical Decision Making This patient presents to the ED for concern of generalized fatigue, this involves an extensive number of treatment options, and is a complaint that carries with it a high risk of complications and morbidity.  The differential diagnosis includes infection/sepsis, viral process, stress,    Co morbidities that complicate the patient evaluation  H/o anxiety   Additional history obtained:  Additional history and/or information obtained from chart review, notable for n/a   Lab Tests:  I Ordered, and personally  interpreted labs.  The pertinent results include:  normal electrolytes, normal renal function, no leukocytosis, normal hgb. UA showing nitrite positive urine, many bacteria, 11-20 WBCs    Imaging Studies ordered:  I ordered imaging studies including n/a I independently visualized and interpreted imaging which showed n/a I agree with the radiologist interpretation   Cardiac Monitoring:  The patient was maintained on a cardiac monitor.  I personally viewed and interpreted the cardiac monitored which showed an underlying rhythm of: n/a   Medicines ordered and prescription drug management:  I ordered medication including Zofran, ativan  for nausea, anxiety Reevaluation of the patient after these medicines showed that the patient improved I have reviewed the patients home medicines and have made adjustments as needed   Test Considered:  N/a   Critical Interventions:  N/a   Consultations Obtained:  I requested consultation with the n/a,  and discussed lab and imaging findings as well as pertinent plan - they recommend: n/a   Problem List / ED Course:  Here with vague symptoms of fatigue, not eating (however asks for food during visit and tolerates w/o nausea) Well appearing Labs reassuring - +UTI (she reports malodorous urine) Has history of hidradenitis and has a left axillary abscess that was opened and drained.  Overall, well appearing Stable for discharge   Reevaluation:  After the interventions noted above, I reevaluated the patient and found that they have :improved   Social Determinants of Health:  Recent job loss   Disposition:  After consideration of the diagnostic results and the patients response to treatment, I feel that the patient would benefit from discharge home..   Amount and/or Complexity of Data Reviewed Labs: ordered.  Risk Prescription drug management.           Final Clinical Impression(s) / ED Diagnoses Final diagnoses:   Acute cystitis without hematuria  Axillary abscess  Stress    Rx / DC Orders ED Discharge Orders          Ordered    cephALEXin (KEFLEX) 500 MG capsule  4 times daily        03/24/24 1811    HYDROcodone-acetaminophen (NORCO/VICODIN) 5-325 MG tablet  Every 4 hours PRN        03/24/24 1811              Elpidio Anis, PA-C 03/24/24 1812    Melene Plan, DO 03/24/24 1826

## 2024-03-24 NOTE — ED Triage Notes (Signed)
 In for eval of weakness, fatigue, nausea, and diarrhea. Lost her job on 03/14 and has not been able to afford her medications. Also abscess to left axilla due to Hidradenitis suppurative.

## 2024-03-24 NOTE — ED Notes (Signed)
 Patient did not want anything to eat at this time.

## 2024-03-24 NOTE — Discharge Instructions (Addendum)
 Take Keflex as prescribed for urinary tract infection. Take Norco for pain related to the axillary abscess if needed. Push fluids, get plenty of rest. Establish with a primary care provider as able.

## 2024-03-28 ENCOUNTER — Telehealth: Payer: Self-pay

## 2024-03-31 ENCOUNTER — Encounter: Payer: Self-pay | Admitting: Family Medicine

## 2024-03-31 ENCOUNTER — Telehealth: Payer: Self-pay | Admitting: Family Medicine

## 2024-03-31 DIAGNOSIS — R000 Tachycardia, unspecified: Secondary | ICD-10-CM

## 2024-03-31 NOTE — Progress Notes (Signed)
 Virtual Visit Consent   [name], you are scheduled for a virtual visit with a [facility] provider today. Just as with appointments in the office, your consent must be obtained to participate. Your consent will be active for this visit and any virtual visit you may have with one of our providers in the next 365 days. If you have a MyChart account, a copy of this consent can be sent to you electronically.  As this is a virtual visit, video technology does not allow for your provider to perform a traditional examination. This may limit your provider's ability to fully assess your condition. If your provider identifies any concerns that need to be evaluated in person or the need to arrange testing (such as labs, EKG, etc.), we will make arrangements to do so. Although advances in technology are sophisticated, we cannot ensure that it will always work on either your end or our end. If the connection with a video visit is poor, the visit may have to be switched to a telephone visit. With either a video or telephone visit, we are not always able to ensure that we have a secure connection.  By engaging in this virtual visit, you consent to the provision of healthcare and authorize for your insurance to be billed (if applicable) for the services provided during this visit. Depending on your insurance coverage, you may receive a charge related to this service.  I need to obtain your verbal consent now. Are you willing to proceed with your visit today? Kristen Haney has provided verbal consent on 03/31/2024 for a virtual visit (video or telephone). Freddy Finner, NP  Date: 03/31/2024 11:25 AM   Virtual Visit via Video Note   I, Freddy Finner, connected with  Kristen Haney  ([id number], 1991-09-05) on 03/31/24 at 11:30 AM EDT by a video-enabled telemedicine application and verified that I am speaking with the correct person using two identifiers.  Location: Patient: Virtual Visit Location Patient:  Home Provider: Virtual Visit Location Provider: Home Office   I discussed the limitations of evaluation and management by telemedicine and the availability of in person appointments. The patient expressed understanding and agreed to proceed.    History of Present Illness: Kristen Haney is a 33 y.o. who identifies as a female who was assigned female at birth, and is being seen today for elevated HR over 100 while trying to donate plasma.  Wanted to know what to do about it. No history of prior. Did have some elevation in BP a week ago while in the ED.   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   Intentional drug overdose (HCC)    Sciatica 02/03/2018   Sickle cell trait (HCC)    Migraines 06/07/1998    Allergies: No Known Allergies Medications:  Current Outpatient Medications:    albuterol (VENTOLIN HFA) 108 (90 Base) MCG/ACT inhaler, Inhale 1-2 puffs into the lungs every 6 (six) hours as needed for wheezing or shortness of breath., Disp: 6.7 g, Rfl: 0   benzoyl peroxide 10 % LIQD, Apply 1 Application topically daily., Disp: 227 g, Rfl: 5   buPROPion [facility]  XL) 300 MG 24 hr tablet, Take 1 tablet (300 mg total) by mouth daily., Disp: 90 tablet, Rfl: 1   cephALEXin (KEFLEX) 500 MG capsule, Take 1 capsule (500 mg total) by mouth 4 (four) times daily., Disp: 20 capsule, Rfl: 0   clindamycin (CLINDAGEL) 1 % gel, Apply topically 2 (two) times daily. Use with the benzoyl peroxide gel., Disp: 30 g, Rfl: 5   cyclobenzaprine (FLEXERIL) 10 MG tablet, Take 1 tablet (10 mg total) by mouth 3 (three)  times daily as needed for muscle spasms., Disp: 30 tablet, Rfl: 0   Erenumab-aooe (AIMOVIG) 140 MG/ML SOAJ, Inject 140 mg into the skin every 28 (twenty-eight) days., Disp: 1.12 mL, Rfl: 11   gabapentin (NEURONTIN) 300 MG capsule, Take 1 capsule (300 mg total) by mouth 2 (two) times daily., Disp: 180 capsule, Rfl: 0   HYDROcodone-acetaminophen (NORCO/VICODIN) 5-325 MG tablet, Take 1 tablet by mouth every 4 (four) hours as needed for severe pain (pain score 7-10)., Disp: 10 tablet, Rfl: 0   hydrOXYzine (ATARAX) 50 MG tablet, Take 2 tablets (100 mg total) by mouth at bedtime., Disp: 180 tablet, Rfl: 1   hydrOXYzine (VISTARIL) 25 MG capsule, Take 1-2 capsules (25-50 mg total) by mouth every 8 (eight) hours as needed. Take 30-45 minutes before donating plasma for anxiety (Patient not taking: Reported on 11/06/2023), Disp: 30 capsule, Rfl: 0   lamoTRIgine (LAMICTAL) 25 MG tablet, Take 2 tablets (50 mg total) by mouth at bedtime., Disp: 60 tablet, Rfl: 2   nystatin cream (MYCOSTATIN), APPLY TO THE AFFECTED AREA(S) 2 TIMES A DAY, Disp: 30 g, Rfl: 0   nystatin-triamcinolone ointment (MYCOLOG), APPLY TO THE AFFECTED AREA(S) TWO TIMES A DAY FOR 14 DAYS, Disp: 30 g, Rfl: 2   phenazopyridine (PYRIDIUM) 100 MG tablet, Take 1 tablet (100 mg total) by mouth 3 (three) times daily as needed for pain., Disp: 10 tablet, Rfl: 0   promethazine (PHENERGAN) 25 MG tablet, Take 1 tablet (25 mg total) by mouth every 8 (eight) hours as needed for nausea or vomiting., Disp: 15 tablet, Rfl: 5   Rimegepant Sulfate (NURTEC) 75 MG TBDP, PLACE 1 TABLET BY MOUTH DAILY AS NEEDED (Patient not taking: Reported on 11/06/2023), Disp: 8 tablet, Rfl: 5   rizatriptan (MAXALT-MLT) 10 MG disintegrating tablet, Take 1 tablet (10 mg total) by mouth as needed for migraine. May repeat in 2 hours if needed.  Maximum 2 tablets in 24 hours., Disp: 10 tablet, Rfl: 5   sildenafil (VIAGRA) 50 MG tablet, Take 0.5 tablets (25 mg total) by mouth daily as needed  for erectile dysfunction., Disp: 10 tablet, Rfl: 5   sulfamethoxazole-trimethoprim (BACTRIM DS) 800-160 MG tablet, Take 1 tablet by mouth 2 (two) times daily., Disp: 10 tablet, Rfl: 0  Observations/Objective: Patient is well-developed, well-nourished in no acute distress.  Resting comfortably  at home.  Head is normocephalic, atraumatic.  No labored breathing.  Speech is clear and coherent with logical content.  Patient is alert and oriented at baseline.    Assessment and Plan:  1. Pulse fast (Primary)  Needs to have in person assessment to see what is going to cause and or trigger this.   Pt upset about being advised to be seen in person, unable to collect additional information, due to patient disconnecting call.   Follow Up Instructions: I discussed the assessment and treatment plan with the patient. The patient was provided an opportunity to ask questions and all were answered. The patient agreed with the plan and demonstrated  an understanding of the instructions.  A copy of instructions were sent to the patient via MyChart unless otherwise noted below.     The patient was advised to call back or seek an in-person evaluation if the symptoms worsen or if the condition fails to improve as anticipated.    Freddy Finner, NP

## 2024-04-01 ENCOUNTER — Encounter (HOSPITAL_BASED_OUTPATIENT_CLINIC_OR_DEPARTMENT_OTHER): Payer: Self-pay | Admitting: *Deleted

## 2024-04-01 ENCOUNTER — Other Ambulatory Visit: Payer: Self-pay

## 2024-04-01 ENCOUNTER — Emergency Department (HOSPITAL_BASED_OUTPATIENT_CLINIC_OR_DEPARTMENT_OTHER)
Admission: EM | Admit: 2024-04-01 | Discharge: 2024-04-01 | Disposition: A | Discharge status: Home / Self Care | Payer: MEDICAID | Attending: Emergency Medicine | Admitting: Emergency Medicine

## 2024-04-01 DIAGNOSIS — R000 Tachycardia, unspecified: Secondary | ICD-10-CM | POA: Diagnosis not present

## 2024-04-01 DIAGNOSIS — F419 Anxiety disorder, unspecified: Secondary | ICD-10-CM | POA: Diagnosis present

## 2024-04-01 MED ORDER — LORAZEPAM 1 MG PO TABS
0.5000 mg | ORAL_TABLET | Freq: Once | ORAL | Status: AC
  Administered 2024-04-01: 0.5 mg via ORAL
  Filled 2024-04-01: qty 1

## 2024-04-01 NOTE — ED Notes (Signed)

## 2024-04-01 NOTE — ED Provider Notes (Signed)
 [place] EMERGENCY DEPARTMENT AT [facility] Provider Note   CSN: [id number] Arrival date & time: 04/01/24  1156     History  Chief Complaint  Patient presents with   Follow-up    Kristen Haney is a 33 y.o. female history of chronic neck pain, sickle cell trait, MDD, anxiety, bipolar 1 presented for tachycardia.  Patient states that whenever she goes to the plasma center to donate plasma her heart rates always in the 120s.  Patient states that she think she gets anxious and would like medications to help bring her heart rate down so they will let her donate states that she can pay her bills.  Patient denies any chest pain, shortness of breath, thyroid problems, fevers, abdominal pain, vision changes, headache.  Home Medications Prior to Admission medications   Medication Sig Start Date End Date Taking? Authorizing Provider  albuterol (VENTOLIN HFA) 108 (90 Base) MCG/ACT inhaler Inhale 1-2 puffs into the lungs every 6 (six) hours as needed for wheezing or shortness of breath. 08/06/21   Haskel Schroeder, PA-C  benzoyl peroxide 10 % LIQD Apply 1 Application topically daily. 04/03/23   Karie Georges, MD  buPROPion (WELLBUTRIN XL) 300 MG 24 hr tablet Take 1 tablet (300 mg total) by mouth daily. 05/24/23   Karie Georges, MD  cephALEXin (KEFLEX) 500 MG capsule Take 1 capsule (500 mg total) by mouth 4 (four) times daily. 03/24/24   Elpidio Anis, PA-C  clindamycin (CLINDAGEL) 1 % gel Apply topically 2 (two) times daily. Use with the benzoyl peroxide gel. 10/15/23   Karie Georges, MD  cyclobenzaprine (FLEXERIL) 10 MG tablet Take 1 tablet (10 mg total) by mouth 3 (three) times daily as needed for muscle spasms. 12/11/23   [name], FNP  Erenumab-aooe (AIMOVIG) 140 MG/ML SOAJ Inject 140 mg into the skin every 28 (twenty-eight) days. 11/06/23   Drema Dallas, DO  gabapentin (NEURONTIN) 300 MG capsule Take 1 capsule (300 mg total) by mouth 2 (two) times daily. 10/28/23    Karie Georges, MD  HYDROcodone-acetaminophen (NORCO/VICODIN) 5-325 MG tablet Take 1 tablet by mouth every 4 (four) hours as needed for severe pain (pain score 7-10). 03/24/24   Elpidio Anis, PA-C  hydrOXYzine (ATARAX) 50 MG tablet Take 2 tablets (100 mg total) by mouth at bedtime. 02/15/23   Karie Georges, MD  hydrOXYzine (VISTARIL) 25 MG capsule Take 1-2 capsules (25-50 mg total) by mouth every 8 (eight) hours as needed. Take 30-45 minutes before donating plasma for anxiety Patient not taking: Reported on 11/06/2023 03/15/23   Karie Georges, MD  lamoTRIgine (LAMICTAL) 25 MG tablet Take 2 tablets (50 mg total) by mouth at bedtime. 10/30/23   Karie Georges, MD  nystatin cream (MYCOSTATIN) APPLY TO THE AFFECTED AREA(S) 2 TIMES A DAY 07/10/23   Karie Georges, MD  nystatin-triamcinolone ointment (MYCOLOG) APPLY TO THE AFFECTED AREA(S) TWO TIMES A DAY FOR 14 DAYS 02/05/23   Karie Georges, MD  phenazopyridine (PYRIDIUM) 100 MG tablet Take 1 tablet (100 mg total) by mouth 3 (three) times daily as needed for pain. 02/20/24   Margaretann Loveless, PA-C  promethazine (PHENERGAN) 25 MG tablet Take 1 tablet (25 mg total) by mouth every 8 (eight) hours as needed for nausea or vomiting. 11/06/23   Drema Dallas, DO  Rimegepant Sulfate (NURTEC) 75 MG TBDP PLACE 1 TABLET BY MOUTH DAILY AS NEEDED Patient not taking: Reported on 11/06/2023 04/02/23   Karie Georges, MD  rizatriptan (MAXALT-MLT) 10 MG disintegrating tablet Take 1 tablet (10 mg total) by mouth as needed for migraine. May repeat in 2 hours if needed.  Maximum 2 tablets in 24 hours. 11/06/23   Drema Dallas, DO  sildenafil (VIAGRA) 50 MG tablet Take 0.5 tablets (25 mg total) by mouth daily as needed for erectile dysfunction. 06/11/23   Karie Georges, MD  sulfamethoxazole-trimethoprim (BACTRIM DS) 800-160 MG tablet Take 1 tablet by mouth 2 (two) times daily. 02/20/24   Margaretann Loveless, PA-C      Allergies    Patient has no  known allergies.    Review of Systems   Review of Systems  Physical Exam Updated Vital Signs BP 119/86   Pulse 81   Temp (!) 97.2 F (36.2 C)   Resp (!) 22   LMP 03/09/2024 (Approximate)   SpO2 100%  Physical Exam Vitals reviewed.  Constitutional:      General: She is not in acute distress. HENT:     Head: Normocephalic and atraumatic.  Eyes:     Extraocular Movements: Extraocular movements intact.     Conjunctiva/sclera: Conjunctivae normal.     Pupils: Pupils are equal, round, and reactive to light.     Comments: No bulging of the eyes  Neck:     Comments: No goiter Cardiovascular:     Rate and Rhythm: Normal rate and regular rhythm.     Pulses: Normal pulses.     Heart sounds: Normal heart sounds.     Comments: 2+ bilateral radial/dorsalis pedis pulses with regular rate Pulmonary:     Effort: Pulmonary effort is normal. No respiratory distress.     Breath sounds: Normal breath sounds.  Abdominal:     Palpations: Abdomen is soft.     Tenderness: There is no abdominal tenderness. There is no guarding or rebound.  Musculoskeletal:        General: Normal range of motion.     Cervical back: Normal range of motion and neck supple.     Comments: 5 out of 5 bilateral grip/leg extension strength  Skin:    General: Skin is warm and dry.     Capillary Refill: Capillary refill takes less than 2 seconds.  Neurological:     General: No focal deficit present.     Mental Status: She is alert and oriented to person, place, and time.     Comments: Sensation intact in all 4 limbs  Psychiatric:        Mood and Affect: Mood normal.     ED Results / Procedures / Treatments   Labs (all labs ordered are listed, but only abnormal results are displayed) Labs Reviewed - No data to display  EKG EKG Interpretation Date/Time:  Wednesday April 01 2024 12:09:15 EDT Ventricular Rate:  80 PR Interval:  156 QRS Duration:  74 QT Interval:  362 QTC Calculation: 417 R  Axis:   71  Text Interpretation: Normal sinus rhythm Normal ECG Artifact Otherwise no significant change Confirmed by Elayne Snare ([id number]) on 04/01/2024 12:58:02 PM  Radiology No results found.  Procedures Procedures    Medications Ordered in ED Medications  LORazepam (ATIVAN) tablet 0.5 mg (0.5 mg Oral Provided for home use 04/01/24 1250)    ED Course/ Medical Decision Making/ A&P                                 Medical Decision Making  [place]  Kle[place] 33 y.o. presented today for anxiety. Working DDx that I considered at this time includes, but not limited to, anxiety, hyperthyroidism/thyrotoxicosis, pregnancy, UTI, ACS, electrolyte abnormalities, acute infection, medication intoxication/withdrawal.  R/o DDx: hyperthyroidism/thyrotoxicosis, pregnancy, UTI, ACS, electrolyte abnormalities, acute infection, medication intoxication/withdrawal: These are considered less likely due to history of present illness and physical exam findings  Review of prior external notes: 03/31/2024 video visit  Unique Tests and My Independent Interpretation:  EKG: Sinus 80 bpm, no ST elevations or depressions or signs of ischemia or right heart strain  Social Determinants of Health: none  Discussion with Independent Historian: None  Discussion of Management of Tests: None  Risk: Medium: prescription drug management  Risk Stratification Score: PERC negative  Plan: On exam, patient is in no acute distress with stable vitals. Patient had unremarkable physical exam. Patient has a history of same with similar episodes. Patient is currently on gabapentin for their anxiety.  Patient states that she does not want labs as she only wants medication to help with her anxiety so that she may donate plasma which is reasonable.  The cardiac monitor was ordered secondary to the patient's history of anxiety and to monitor the patient for dysrhythmia. Cardiac monitor by my independent interpretation showed normal  sinus. No exophthalmos. Stress reducing mechanisms discussed including caffeine intake.  Patient has been referred to psychiatric services for follow-up but encourage to return to ER if symptoms change or worsen.  Will give 1 dose of Ativan here and have her follow-up with her primary care provider.  Patient is well-appearing and is not endorsing any symptoms of necessitate further workup and as mentioned previously patient does not want any labs which I think is reasonable.  Patient is currently PERC negative.  Patient was given return precautions. Patient stable for discharge at this time.  Patient verbalized understanding of plan.  This chart was dictated using voice recognition software.  Despite best efforts to proofread,  errors can occur which can change the documentation meaning.         Final Clinical Impression(s) / ED Diagnoses Final diagnoses:  Anxiety    Rx / DC Orders ED Discharge Orders     None         Netta Corrigan, PA-C 04/01/24 1259    Elayne Snare K, DO 04/01/24 1422

## 2024-04-01 NOTE — Discharge Instructions (Addendum)
 Please follow-up with your primary care provider regards to recent ER visit.  Today your exam is reassuring and as we agreed I have given you 1 dose of Ativan to help with your anxiety when it comes to having blood drawn.  I have also attached the behavioral health urgent care if you need further assistance with your anxiety.  If symptoms change or worsen please return to the ER.

## 2024-04-01 NOTE — ED Triage Notes (Signed)
 Pt went to sell plasma at [facility] in [place] and they told her that her pulse was too high to donate (she states that it was 129) she states that this has occurred before x2 and she states that she has lost her job and really needs the money from selling the plasma and so she came here for evaluation to see if she could take something to prevent her from getting nervous about her HR being up.  No CP or sob with this.  HR WNL in triage.

## 2024-04-14 ENCOUNTER — Ambulatory Visit: Payer: PRIVATE HEALTH INSURANCE | Admitting: Neurology

## 2024-04-14 ENCOUNTER — Ambulatory Visit (HOSPITAL_COMMUNITY): Payer: Self-pay

## 2024-04-14 ENCOUNTER — Encounter (HOSPITAL_COMMUNITY): Payer: Self-pay

## 2024-04-24 ENCOUNTER — Encounter (HOSPITAL_BASED_OUTPATIENT_CLINIC_OR_DEPARTMENT_OTHER): Payer: Self-pay | Admitting: Emergency Medicine

## 2024-04-24 ENCOUNTER — Telehealth: Payer: MEDICAID | Admitting: Family Medicine

## 2024-04-24 ENCOUNTER — Other Ambulatory Visit: Payer: Self-pay

## 2024-04-24 ENCOUNTER — Emergency Department (HOSPITAL_BASED_OUTPATIENT_CLINIC_OR_DEPARTMENT_OTHER)
Admission: EM | Admit: 2024-04-24 | Discharge: 2024-04-24 | Disposition: A | Discharge status: Home / Self Care | Payer: MEDICAID | Attending: Emergency Medicine | Admitting: Emergency Medicine

## 2024-04-24 DIAGNOSIS — M79605 Pain in left leg: Secondary | ICD-10-CM | POA: Diagnosis present

## 2024-04-24 DIAGNOSIS — M5432 Sciatica, left side: Secondary | ICD-10-CM | POA: Diagnosis not present

## 2024-04-24 MED ORDER — PREDNISONE 50 MG PO TABS
60.0000 mg | ORAL_TABLET | Freq: Once | ORAL | Status: AC
  Administered 2024-04-24: 60 mg via ORAL
  Filled 2024-04-24: qty 1

## 2024-04-24 MED ORDER — IBUPROFEN 600 MG PO TABS: 600.0000 mg | ORAL_TABLET | Freq: Three times a day (TID) | ORAL | 0 refills | Status: DC | PRN

## 2024-04-24 MED ORDER — IBUPROFEN 800 MG PO TABS
800.0000 mg | ORAL_TABLET | Freq: Once | ORAL | Status: AC
  Administered 2024-04-24: 800 mg via ORAL
  Filled 2024-04-24: qty 1

## 2024-04-24 MED ORDER — GABAPENTIN 300 MG PO CAPS: 300.0000 mg | ORAL_CAPSULE | Freq: Every day | ORAL | 0 refills | Status: DC

## 2024-04-24 MED ORDER — HYDROCODONE-ACETAMINOPHEN 5-325 MG PO TABS: 1.0000 | ORAL_TABLET | Freq: Three times a day (TID) | ORAL | 0 refills | Status: AC | PRN

## 2024-04-24 MED ORDER — PREDNISONE 10 MG PO TABS: 40.0000 mg | ORAL_TABLET | Freq: Every day | ORAL | 0 refills | Status: AC

## 2024-04-24 NOTE — ED Notes (Signed)
 Discharge instructions, follow up care, and prescriptions reviewed and explained, pt verbalized understanding and had no further questions on d/c. Pt caox4, ambulatory on d/c.

## 2024-04-24 NOTE — ED Provider Notes (Signed)
 [place] EMERGENCY DEPARTMENT AT [facility] Provider Note   CSN: [id number] Arrival date & time: 04/24/24  5784     History  Chief Complaint  Patient presents with   Leg Pain    Kristen Haney is a 33 y.o. 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  She denies any preceding trauma.  She reports the pain radiates down her left buttock from her lower back to the mid thigh.  It does not travel all the way to the legs.  It is worse with walking and bearing weight, better when she flexes her left leg, though the pain still prevents her from sleeping.  She reports she had been on gabapentin  300 mg at night for anxiety and neuropathy but ran out of this.  She is currently between doctors as her prior doctor had moved, and then the patient lost medical insurance due to job issues, but now is back on Medicaid.  HPI     Home Medications Prior to Admission medications   Medication Sig Start Date End Date Taking? Authorizing Provider  gabapentin  (NEURONTIN ) 300 MG capsule Take 1 capsule (300 mg total) by mouth at bedtime for 14 days. 04/24/24 05/08/24 Yes Kirti Carl, Janalyn Me, MD  HYDROcodone -acetaminophen  (NORCO/VICODIN) 5-325 MG tablet Take 1 tablet by mouth every 8 (eight) hours as needed for up to 5 doses. 04/24/24  Yes Edeline Greening, Janalyn Me, MD  ibuprofen  (ADVIL ) 600 MG tablet Take 1 tablet (600 mg total) by mouth every 8 (eight) hours as needed for up to 30 doses for mild pain (pain score 1-3) or moderate pain (pain score 4-6). 04/24/24  Yes Dyer Klug, Janalyn Me, MD  predniSONE  (DELTASONE ) 10 MG tablet Take 4 tablets (40 mg total) by mouth daily with breakfast for 4 days. 04/25/24 04/29/24 Yes Armonie Staten, Janalyn Me, MD  albuterol  (VENTOLIN  HFA) 108 [id number] Base) MCG/ACT inhaler Inhale 1-2 puffs into the lungs every 6 (six) hours as needed for wheezing or shortness of breath. 08/06/21   Marshal Skeens, PA-C   benzoyl peroxide  10 % LIQD Apply 1 Application topically daily. 04/03/23   Aida House, MD  buPROPion  (WELLBUTRIN  XL) 300 MG 24 hr tablet Take 1 tablet (300 mg total) by mouth daily. 05/24/23   Aida House, MD  cephALEXin  (KEFLEX ) 500 MG capsule Take 1 capsule (500 mg total) by mouth 4 (four) times daily. 03/24/24   Mandy Second, PA-C  clindamycin  (CLINDAGEL) 1 % gel Apply topically 2 (two) times daily. Use with the benzoyl peroxide  gel. 10/15/23   Aida House, MD  cyclobenzaprine  (FLEXERIL ) 10 MG tablet Take 1 tablet (10 mg total) by mouth 3 (three) times daily as needed for muscle spasms. 12/11/23   Mardene Shake, FNP  Erenumab -aooe (AIMOVIG ) 140 MG/ML SOAJ Inject 140 mg into the skin every 28 (twenty-eight) days. 11/06/23   Merriam Abbey, DO  gabapentin  (NEURONTIN ) 300 MG capsule Take 1 capsule (300 mg total) by mouth 2 (two) times daily. 10/28/23   Aida House, MD  HYDROcodone -acetaminophen  (NORCO/VICODIN) 5-325 MG tablet Take 1 tablet by mouth every 4 (four) hours as needed for severe pain (pain score 7-10). 03/24/24   Mandy Second, PA-C  hydrOXYzine  (ATARAX ) 50 MG tablet Take 2 tablets (100 mg total) by mouth at bedtime. 02/15/23   Aida House, MD  hydrOXYzine  (VISTARIL ) 25 MG capsule Take 1-2 capsules (25-50 mg total) by mouth every 8 (eight)  hours as needed. Take 30-45 minutes before donating plasma for anxiety Patient not taking: Reported on 11/06/2023 03/15/23   Aida House, MD  lamoTRIgine  (LAMICTAL ) 25 MG tablet Take 2 tablets (50 mg total) by mouth at bedtime. 10/30/23   Aida House, MD  nystatin  cream (MYCOSTATIN ) APPLY TO THE AFFECTED AREA(S) 2 TIMES A DAY 07/10/23   Aida House, MD  nystatin -triamcinolone  ointment (MYCOLOG) APPLY TO THE AFFECTED AREA(S) TWO TIMES A DAY FOR 14 DAYS 02/05/23   Aida House, MD  phenazopyridine  (PYRIDIUM ) 100 MG tablet Take 1 tablet (100 mg total) by mouth 3 (three) times daily as needed for pain.  02/20/24   Burnette, Jennifer M, PA-C  promethazine  (PHENERGAN ) 25 MG tablet Take 1 tablet (25 mg total) by mouth every 8 (eight) hours as needed for nausea or vomiting. 11/06/23   Merriam Abbey, DO  Rimegepant Sulfate (NURTEC) 75 MG TBDP PLACE 1 TABLET BY MOUTH DAILY AS NEEDED Patient not taking: Reported on 11/06/2023 04/02/23   Aida House, MD  rizatriptan  (MAXALT -MLT) 10 MG disintegrating tablet Take 1 tablet (10 mg total) by mouth as needed for migraine. May repeat in 2 hours if needed.  Maximum 2 tablets in 24 hours. 11/06/23   Merriam Abbey, DO  sildenafil  (VIAGRA ) 50 MG tablet Take 0.5 tablets (25 mg total) by mouth daily as needed for erectile dysfunction. 06/11/23   Aida House, MD  sulfamethoxazole -trimethoprim  (BACTRIM  DS) 800-160 MG tablet Take 1 tablet by mouth 2 (two) times daily. 02/20/24   Burnette, Jennifer M, PA-C      Allergies    Patient has no known allergies.    Review of Systems   Review of Systems  Physical Exam Updated Vital Signs BP 130/84   Pulse 81   Temp 98 F (36.7 C) (Oral)   Resp 18   LMP 04/17/2024   SpO2 100%  Physical Exam Constitutional:      General: She is not in acute distress. HENT:     Head: Normocephalic and atraumatic.  Eyes:     Conjunctiva/sclera: Conjunctivae normal.     Pupils: Pupils are equal, round, and reactive to light.  Cardiovascular:     Rate and Rhythm: Normal rate and regular rhythm.  Pulmonary:     Effort: Pulmonary effort is normal. No respiratory distress.  Skin:    General: Skin is warm and dry.  Neurological:     Mental Status: She is alert. Mental status is at baseline.     Comments: Straight leg test positive on the left side, patient is able to ambulate, no motor weakness of the lower extremity, no saddle anesthesia  Psychiatric:        Mood and Affect: Mood normal.        Behavior: Behavior normal.     ED Results / Procedures / Treatments   Labs (all labs ordered are listed, but only abnormal  results are displayed) Labs Reviewed - No data to display  EKG None  Radiology No results found.  Procedures Procedures    Medications Ordered in ED Medications  ibuprofen  (ADVIL ) tablet 800 mg (800 mg Oral Given 04/24/24 0739)  predniSONE  (DELTASONE ) tablet 60 mg (60 mg Oral Given 04/24/24 0739)    ED Course/ Medical Decision Making/ A&P                                 Medical Decision Making Risk  Prescription drug management.   Patient is presenting to the emergency department with atraumatic left-sided lower hip pain that is quite consistent with sciatica.  No red flags for cauda equina syndrome.  No indication for x-ray, CT or MRI imaging.  Low suspicion for fracture.  Low suspicion for DVT or infection.  We discussed pain control at home.  We will start her on prednisone  as well as ibuprofen .  I did provide 5 tablets of Norco for breakthrough pain in the immediate 1 to 2 days until the anti-inflammatories can fully take effect.  I also provided a 2-week refill of her nightly gabapentin .  I made clear that she should not mix gabapentin  with Norco which is a narcotic.  She also should not drive or perform dangerous activities after taking Norco, or drink alcohol with these.  She verbalized understanding and is stable for discharge.  She was started on her prednisone  and given ibuprofen  here in the ED.        Final Clinical Impression(s) / ED Diagnoses Final diagnoses:  Sciatica of left side    Rx / DC Orders ED Discharge Orders          Ordered    predniSONE  (DELTASONE ) 10 MG tablet  Daily with breakfast        04/24/24 0738    ibuprofen  (ADVIL ) 600 MG tablet  Every 8 hours PRN        04/24/24 0738    gabapentin  (NEURONTIN ) 300 MG capsule  Daily at bedtime        04/24/24 0738    HYDROcodone -acetaminophen  (NORCO/VICODIN) 5-325 MG tablet  Every 8 hours PRN        04/24/24 0738              Arvilla Birmingham, MD 04/24/24 [phone]

## 2024-04-24 NOTE — ED Triage Notes (Signed)
 Left sided leg pain starting yesterday and progressing. She has taken muscle relaxers in past for it.

## 2024-04-24 NOTE — Discharge Instructions (Addendum)
 Please do NOT take gabapentin  and Norco (opioid) at the same time.  Both of these can make you drowsy.  Do not drink alcohol or use any sedating medicine while taking gabapentin  or Norco.  Do not drive after taking opioid narcotics.

## 2024-04-24 NOTE — Progress Notes (Signed)
 Pt did not show for visit [facility]

## 2024-05-01 ENCOUNTER — Telehealth: Payer: MEDICAID | Admitting: Family Medicine

## 2024-05-01 DIAGNOSIS — G4700 Insomnia, unspecified: Secondary | ICD-10-CM | POA: Diagnosis not present

## 2024-05-01 DIAGNOSIS — F419 Anxiety disorder, unspecified: Secondary | ICD-10-CM | POA: Diagnosis not present

## 2024-05-01 MED ORDER — GABAPENTIN 300 MG PO CAPS: 300.0000 mg | ORAL_CAPSULE | Freq: Every day | ORAL | 0 refills | Status: DC

## 2024-05-01 MED ORDER — TRAZODONE HCL 100 MG PO TABS: 100.0000 mg | ORAL_TABLET | Freq: Every day | ORAL | 0 refills | Status: AC

## 2024-05-01 NOTE — Patient Instructions (Signed)
 Generalized Anxiety Disorder, Adult Generalized anxiety disorder (GAD) is a mental health condition. Unlike normal worries, anxiety related to GAD is not triggered by a specific event. These worries do not fade or get better with time. GAD interferes with relationships, work, and school. GAD symptoms can vary from mild to severe. People with severe GAD can have intense waves of anxiety with physical symptoms that are similar to panic attacks. What are the causes? The exact cause of GAD is not known, but the following are believed to have an impact: Differences in natural brain chemicals. Genes passed down from parents to children. Differences in the way threats are perceived. Development and stress during childhood. Personality. What increases the risk? The following factors may make you more likely to develop this condition: Being female. Having a family history of anxiety disorders. Being very shy. Experiencing very stressful life events, such as the death of a loved one. Having a very stressful family environment. What are the signs or symptoms? People with GAD often worry excessively about many things in their lives, such as their health and family. Symptoms may also include: Mental and emotional symptoms: Worrying excessively about natural disasters. Fear of being late. Difficulty concentrating. Fears that others are judging your performance. Physical symptoms: Fatigue. Headaches, muscle tension, muscle twitches, trembling, or feeling shaky. Feeling like your heart is pounding or beating very fast. Feeling out of breath or like you cannot take a deep breath. Having trouble falling asleep or staying asleep, or experiencing restlessness. Sweating. Nausea, diarrhea, or irritable bowel syndrome (IBS). Behavioral symptoms: Experiencing erratic moods or irritability. Avoidance of new situations. Avoidance of people. Extreme difficulty making decisions. How is this diagnosed? This  condition is diagnosed based on your symptoms and medical history. You will also have a physical exam. Your health care provider may perform tests to rule out other possible causes of your symptoms. To be diagnosed with GAD, a person must have anxiety that: Is out of his or her control. Affects several different aspects of his or her life, such as work and relationships. Causes distress that makes him or her unable to take part in normal activities. Includes at least three symptoms of GAD, such as restlessness, fatigue, trouble concentrating, irritability, muscle tension, or sleep problems. Before your health care provider can confirm a diagnosis of GAD, these symptoms must be present more days than they are not, and they must last for 6 months or longer. How is this treated? This condition may be treated with: Medicine. Antidepressant medicine is usually prescribed for long-term daily control. Anti-anxiety medicines may be added in severe cases, especially when panic attacks occur. Talk therapy (psychotherapy). Certain types of talk therapy can be helpful in treating GAD by providing support, education, and guidance. Options include: Cognitive behavioral therapy (CBT). People learn coping skills and self-calming techniques to ease their physical symptoms. They learn to identify unrealistic thoughts and behaviors and to replace them with more appropriate thoughts and behaviors. Acceptance and commitment therapy (ACT). This treatment teaches people how to be mindful as a way to cope with unwanted thoughts and feelings. Biofeedback. This process trains you to manage your body's response (physiological response) through breathing techniques and relaxation methods. You will work with a therapist while machines are used to monitor your physical symptoms. Stress management techniques. These include yoga, meditation, and exercise. A mental health specialist can help determine which treatment is best for you.  Some people see improvement with one type of therapy. However, other people require  a combination of therapies. Follow these instructions at home: Lifestyle Maintain a consistent routine and schedule. Anticipate stressful situations. Create a plan and allow extra time to work with your plan. Practice stress management or self-calming techniques that you have learned from your therapist or your health care provider. Exercise regularly and spend time outdoors. Eat a healthy diet that includes plenty of vegetables, fruits, whole grains, low-fat dairy products, and lean protein. Do not eat a lot of foods that are high in fat, added sugar, or salt (sodium). Drink plenty of water. Avoid alcohol. Alcohol can increase anxiety. Avoid caffeine and certain over-the-counter cold medicines. These may make you feel worse. Ask your pharmacist which medicines to avoid. General instructions Take over-the-counter and prescription medicines only as told by your health care provider. Understand that you are likely to have setbacks. Accept this and be kind to yourself as you persist to take better care of yourself. Anticipate stressful situations. Create a plan and allow extra time to work with your plan. Recognize and accept your accomplishments, even if you judge them as small. Spend time with people who care about you. Keep all follow-up visits. This is important. Where to find more information [employer] of [employer]: http://www.maynard.net/ Substance Abuse and Mental Health Services: [employer].pt Contact a health care provider if: Your symptoms do not get better. Your symptoms get worse. You have signs of depression, such as: A persistently sad or irritable mood. Loss of enjoyment in activities that used to bring you joy. Change in weight or eating. Changes in sleeping habits. Get help right away if: You have thoughts about hurting yourself or others. If you ever feel like you may hurt  yourself or others, or have thoughts about taking your own life, get help right away. Go to your nearest emergency department or: Call your local emergency services (911 in the [place] Call a suicide crisis helpline, such as the National Suicide Prevention Lifeline at 320-380-3432 or 988 in the [place] This is open 24 hours a day in the [place] If you're a Veteran: Call 988 and press 1. This is open 24 hours a day. Text the [employer] at 289 194 1971. Summary Generalized anxiety disorder (GAD) is a mental health condition that involves worry that is not triggered by a specific event. People with GAD often worry excessively about many things in their lives, such as their health and family. GAD may cause symptoms such as restlessness, trouble concentrating, sleep problems, frequent sweating, nausea, diarrhea, headaches, and trembling or muscle twitching. A mental health specialist can help determine which treatment is best for you. Some people see improvement with one type of therapy. However, other people require a combination of therapies. This information is not intended to replace advice given to you by your health care provider. Make sure you discuss any questions you have with your health care provider. Document Revised: 08/01/2023 Document Reviewed: 04/09/2021 Elsevier Patient Education  2024 [employer] Inc[employer]Insomnia Insomnia is a sleep disorder that makes it difficult to fall asleep or stay asleep. Insomnia can cause fatigue, low energy, difficulty concentrating, mood swings, and poor performance at work or school. There are three different ways to classify insomnia: Difficulty falling asleep. Difficulty staying asleep. Waking up too early in the morning. Any type of insomnia can be long-term (chronic) or short-term (acute). Both are common. Short-term insomnia usually lasts for 3 months or less. Chronic insomnia occurs at least three times a week for longer than 3 months. What are the  causes?  Insomnia may be caused by another condition, situation, or substance, such as: Having certain mental health conditions, such as anxiety and depression. Using caffeine, alcohol, tobacco, or drugs. Having gastrointestinal conditions, such as gastroesophageal reflux disease (GERD). Having certain medical conditions. These include: Asthma. Alzheimer's disease. Stroke. Chronic pain. An overactive thyroid gland (hyperthyroidism). Other sleep disorders, such as restless legs syndrome and sleep apnea. Menopause. Sometimes, the cause of insomnia may not be known. What increases the risk? Risk factors for insomnia include: Gender. Females are affected more often than males. Age. Insomnia is more common as people get older. Stress and certain medical and mental health conditions. Lack of exercise. Having an irregular work schedule. This may include working night shifts and traveling between different time zones. What are the signs or symptoms? If you have insomnia, the main symptom is having trouble falling asleep or having trouble staying asleep. This may lead to other symptoms, such as: Feeling tired or having low energy. Feeling nervous about going to sleep. Not feeling rested in the morning. Having trouble concentrating. Feeling irritable, anxious, or depressed. How is this diagnosed? This condition may be diagnosed based on: Your symptoms and medical history. Your health care provider may ask about: Your sleep habits. Any medical conditions you have. Your mental health. A physical exam. How is this treated? Treatment for insomnia depends on the cause. Treatment may focus on treating an underlying condition that is causing the insomnia. Treatment may also include: Medicines to help you sleep. Counseling or therapy. Lifestyle adjustments to help you sleep better. Follow these instructions at home: Eating and drinking  Limit or avoid alcohol, caffeinated beverages, and  products that contain nicotine and tobacco, especially close to bedtime. These can disrupt your sleep. Do not eat a large meal or eat spicy foods right before bedtime. This can lead to digestive discomfort that can make it hard for you to sleep. Sleep habits  Keep a sleep diary to help you and your health care provider figure out what could be causing your insomnia. Write down: When you sleep. When you wake up during the night. How well you sleep and how rested you feel the next day. Any side effects of medicines you are taking. What you eat and drink. Make your bedroom a dark, comfortable place where it is easy to fall asleep. Put up shades or blackout curtains to block light from outside. Use a white noise machine to block noise. Keep the temperature cool. Limit screen use before bedtime. This includes: Not watching TV. Not using your smartphone, tablet, or computer. Stick to a routine that includes going to bed and waking up at the same times every day and night. This can help you fall asleep faster. Consider making a quiet activity, such as reading, part of your nighttime routine. Try to avoid taking naps during the day so that you sleep better at night. Get out of bed if you are still awake after 15 minutes of trying to sleep. Keep the lights down, but try reading or doing a quiet activity. When you feel sleepy, go back to bed. General instructions Take over-the-counter and prescription medicines only as told by your health care provider. Exercise regularly as told by your health care provider. However, avoid exercising in the hours right before bedtime. Use relaxation techniques to manage stress. Ask your health care provider to suggest some techniques that may work well for you. These may include: Breathing exercises. Routines to release muscle tension. Visualizing peaceful scenes. Make sure  that you drive carefully. Do not drive if you feel very sleepy. Keep all follow-up visits.  This is important. Contact a health care provider if: You are tired throughout the day. You have trouble in your daily routine due to sleepiness. You continue to have sleep problems, or your sleep problems get worse. Get help right away if: You have thoughts about hurting yourself or someone else. Get help right away if you feel like you may hurt yourself or others, or have thoughts about taking your own life. Go to your nearest emergency room or: Call 911. Call the National Suicide Prevention Lifeline at (279)667-7462 or 988. This is open 24 hours a day. Text the Crisis Text Line at 418-142-7189. Summary Insomnia is a sleep disorder that makes it difficult to fall asleep or stay asleep. Insomnia can be long-term (chronic) or short-term (acute). Treatment for insomnia depends on the cause. Treatment may focus on treating an underlying condition that is causing the insomnia. Keep a sleep diary to help you and your health care provider figure out what could be causing your insomnia. This information is not intended to replace advice given to you by your health care provider. Make sure you discuss any questions you have with your health care provider. Document Revised: 11/27/2021 Document Reviewed: 11/27/2021 Elsevier Patient Education  2024 [employer].

## 2024-05-01 NOTE — Progress Notes (Signed)
 Virtual Visit Consent   Kristen Haney, you are scheduled for a virtual visit with a [facility] provider today. Just as with appointments in the office, your consent must be obtained to participate. Your consent will be active for this visit and any virtual visit you may have with one of our providers in the next 365 days. If you have a MyChart account, a copy of this consent can be sent to you electronically.  As this is a virtual visit, video technology does not allow for your provider to perform a traditional examination. This may limit your provider's ability to fully assess your condition. If your provider identifies any concerns that need to be evaluated in person or the need to arrange testing (such as labs, EKG, etc.), we will make arrangements to do so. Although advances in technology are sophisticated, we cannot ensure that it will always work on either your end or our end. If the connection with a video visit is poor, the visit may have to be switched to a telephone visit. With either a video or telephone visit, we are not always able to ensure that we have a secure connection.  By engaging in this virtual visit, you consent to the provision of healthcare and authorize for your insurance to be billed (if applicable) for the services provided during this visit. Depending on your insurance coverage, you may receive a charge related to this service.  I need to obtain your verbal consent now. Are you willing to proceed with your visit today? Kristen Haney has provided verbal consent on 05/01/2024 for a virtual visit (video or telephone). Kristen Huger, FNP  Date: 05/01/2024 10:36 AM   Virtual Visit via Video Note   I, Kristen Haney, connected with  Kristen Haney  ([id number], 14-Jun-1991) on 05/01/24 at 10:30 AM EDT by a video-enabled telemedicine application and verified that I am speaking with the correct person using two identifiers.  Location: Patient: Virtual Visit Location Patient:  Home Provider: Virtual Visit Location Provider: Home Office   I discussed the limitations of evaluation and management by telemedicine and the availability of in person appointments. The patient expressed understanding and agreed to proceed.    History of Present Illness: Kristen Haney is a 33 y.o. who identifies as a female who was assigned female at birth, and is being seen today for refill on trazodone  and gabapentin  for sleep and anxiety. Denies suicidal and homicidal thoughts. Has apptmt with pcp in 2 weeks. 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   Intentional drug overdose (HCC)    Sciatica 02/03/2018   Sickle cell trait (HCC)    Migraines 06/07/1998    Allergies: No Known Allergies Medications:  Current Outpatient Medications:    albuterol  (VENTOLIN  HFA) 108 (90 Base) MCG/ACT inhaler, Inhale 1-2 puffs into the lungs every 6 (six) hours as needed for wheezing or shortness of breath., Disp: 6.7 g, Rfl: 0   benzoyl peroxide  10 % LIQD, Apply 1 Application topically daily., Disp: 227 g, Rfl: 5   buPROPion  (WELLBUTRIN  XL) 300 MG 24 hr tablet, Take 1 tablet (300 mg total) by  mouth daily., Disp: 90 tablet, Rfl: 1   cephALEXin  (KEFLEX ) 500 MG capsule, Take 1 capsule (500 mg total) by mouth 4 (four) times daily., Disp: 20 capsule, Rfl: 0   clindamycin  (CLINDAGEL) 1 % gel, Apply topically 2 (two) times daily. Use with the benzoyl peroxide  gel., Disp: 30 g, Rfl: 5   cyclobenzaprine  (FLEXERIL ) 10 MG tablet, Take 1 tablet (10 mg total) by mouth 3 (three) times daily as needed for muscle  spasms., Disp: 30 tablet, Rfl: 0   Erenumab -aooe (AIMOVIG ) 140 MG/ML SOAJ, Inject 140 mg into the skin every 28 (twenty-eight) days., Disp: 1.12 mL, Rfl: 11   gabapentin  (NEURONTIN ) 300 MG capsule, Take 1 capsule (300 mg total) by mouth 2 (two) times daily., Disp: 180 capsule, Rfl: 0   gabapentin  (NEURONTIN ) 300 MG capsule, Take 1 capsule (300 mg total) by mouth at bedtime for 14 days., Disp: 14 capsule, Rfl: 0   HYDROcodone -acetaminophen  (NORCO/VICODIN) 5-325 MG tablet, Take 1 tablet by mouth every 4 (four) hours as needed for severe pain (pain score 7-10)., Disp: 10 tablet, Rfl: 0   HYDROcodone -acetaminophen  (NORCO/VICODIN) 5-325 MG tablet, Take 1 tablet by mouth every 8 (eight) hours as needed for up to 5 doses., Disp: 5 tablet, Rfl: 0   hydrOXYzine  (ATARAX ) 50 MG tablet, Take 2 tablets (100 mg total) by mouth at bedtime., Disp: 180 tablet, Rfl: 1   hydrOXYzine  (VISTARIL ) 25 MG capsule, Take 1-2 capsules (25-50 mg total) by mouth every 8 (eight) hours as needed. Take 30-45 minutes before donating plasma for anxiety (Patient not taking: Reported on 11/06/2023), Disp: 30 capsule, Rfl: 0   ibuprofen  (ADVIL ) 600 MG tablet, Take 1 tablet (600 mg total) by mouth every 8 (eight) hours as needed for up to 30 doses for mild pain (pain score 1-3) or moderate pain (pain score 4-6)., Disp: 30 tablet, Rfl: 0   lamoTRIgine  (LAMICTAL ) 25 MG tablet, Take 2 tablets (50 mg total) by mouth at bedtime., Disp: 60 tablet, Rfl: 2   nystatin  cream (MYCOSTATIN ), APPLY TO THE AFFECTED AREA(S) 2 TIMES A DAY, Disp: 30 g, Rfl: 0   nystatin -triamcinolone  ointment (MYCOLOG), APPLY TO THE AFFECTED AREA(S) TWO TIMES A DAY FOR 14 DAYS, Disp: 30 g, Rfl: 2   phenazopyridine  (PYRIDIUM ) 100 MG tablet, Take 1 tablet (100 mg total) by mouth 3 (three) times daily as needed for pain., Disp: 10 tablet, Rfl: 0   promethazine  (PHENERGAN ) 25 MG tablet, Take 1 tablet (25 mg total) by mouth every 8 (eight) hours as needed for nausea or vomiting.,  Disp: 15 tablet, Rfl: 5   Rimegepant Sulfate (NURTEC) 75 MG TBDP, PLACE 1 TABLET BY MOUTH DAILY AS NEEDED (Patient not taking: Reported on 11/06/2023), Disp: 8 tablet, Rfl: 5   rizatriptan  (MAXALT -MLT) 10 MG disintegrating tablet, Take 1 tablet (10 mg total) by mouth as needed for migraine. May repeat in 2 hours if needed.  Maximum 2 tablets in 24 hours., Disp: 10 tablet, Rfl: 5   sildenafil  (VIAGRA ) 50 MG tablet, Take 0.5 tablets (25 mg total) by mouth daily as needed for erectile dysfunction., Disp: 10 tablet, Rfl: 5   sulfamethoxazole -trimethoprim  (BACTRIM  DS) 800-160 MG tablet, Take 1 tablet by mouth 2 (two) times daily., Disp: 10 tablet, Rfl: 0  Observations/Objective: Patient is well-developed, well-nourished in no acute distress.  Resting comfortably  at home.  Head is normocephalic, atraumatic.  No labored breathing.  Speech is clear and coherent with logical content.  Patient is alert and oriented at baseline.    Assessment  and Plan: 1. Insomnia, unspecified type (Primary)  2. Anxiety  Keep follow up with pcp. ED if sx worsen.   Follow Up Instructions: I discussed the assessment and treatment plan with the patient. The patient was provided an opportunity to ask questions and all were answered. The patient agreed with the plan and demonstrated an understanding of the instructions.  A copy of instructions were sent to the patient via MyChart unless otherwise noted below.     The patient was advised to call back or seek an in-person evaluation if the symptoms worsen or if the condition fails to improve as anticipated.    Davinci Glotfelty, FNP

## 2024-05-07 NOTE — Progress Notes (Deleted)
 NEUROLOGY FOLLOW UP OFFICE NOTE  Kristen Haney [id number]  Assessment/Plan:   Chronic migraine without aura, without status migrainosus, not intractable  Patient is under the care of psychiatry for her depression and Bipolar disorder.  She is already on bupropion  and lamotrigine .  To avoid negatively impacting her treatment, I do not want to start another antidepressant or antiepileptic medication.  I would like to avoid a beta blocker so as not to aggravate her depression.  Therefore, I feel she is a candidate for a CGRP inhibitor preventative. Migraine prevention:  Aimovig  140mg  every 28 days *** Migraine rescue:  Rizatriptan -MLT 10mg  and promethazine  25mg  *** Limit use of pain relievers to no more than 9 days out of the month to prevent risk of rebound or medication-overuse headache. Keep headache diary Follow up 6 months.    Subjective:  Kristen Haney is a 33 year old right-handed female with hidradenitis, asthma, Bioplar 1 disorder, depression and chronic post-thoracotomy pain who follows up for migraines.  UPDATE: Aimovig  *** Intensity:  *** Duration:  *** with rizatriptan  Frequency:  *** Frequency of abortive medication: *** Current NSAIDS/analgesics:  none Current triptans:  rizatriptan -MLT 10mg  *** Current ergotamine:  none Current anti-emetic:  Phenergan  25mg  Current muscle relaxants:  Flexeril  5-10mg  TID PRN Current Antihypertensive medications:  none Current Antidepressant medications:  Wellbutrin  XL 300mg  daily Current Anticonvulsant medications:  lamotrigine  50mg  at bedtime, gabapentin  300mg  twice daily Current anti-CGRP:  none Current Vitamins/Herbal/Supplements:  none Current Antihistamines/Decongestants:  none Other therapy:  none Birth control:  none Other medications:  hydroxyzine , trazodone , albuterol , sildenafil    Caffeine:  decaff coffee since April.  Tea Diet:  Water, tea.  No soda. Exercise:  walks at work Depression:  stable; Anxiety:   stable Other pain:  chronic pain Sleep hygiene:  6.5-7 hours on trazodone .  Overall, feels rested.  HISTORY: Onset:  [age] years old, progressively gotten worse over the years.  Has been chronic for several years. Location:  bifrontal, bilateral periorbital, bilateral maxillary Quality:  pounding Intensity:  Severe.   Aura:  absent Prodrome:  absent Associated symptoms:  Photophobia, phonophobia, osmophobia, sees little black spots, nausea, sometimes vomiting.  She denies associated unilateral numbness or weakness. Duration:  1 day Frequency:  15-20 days a month Frequency of abortive medication: does not use analgesics Triggers:  looking at a computer screen all day, coffee Relieving factors:  none Activity:  aggravates, cannot function 20 days a month  CT head on 02/10/2023 personally reviewed was unremarkable.  CT cervical spine personally reviewed was unremarkable.    Past NSAIDS/analgesics:  naproxen , tramadol , ibuprofen , Toradol , Excedrin Migraine Past abortive triptans:  sumatriptan  tab Past abortive ergotamine:  none Past muscle relaxants:  none Past anti-emetic:  Zofran , Reglan  Past antihypertensive medications:  none Past antidepressant medications:  none Past anticonvulsant medications:  none Past anti-CGRP:  Nurtec PRN Other past therapies:  ice packs.   Family history of headache:  mom (migraines)  PAST MEDICAL HISTORY: Past Medical History:  Diagnosis Date   Abscesses of both axillae    Asthma    Bipolar 1 disorder (HCC)    Chronic post-thoracotomy pain    Depression    Hidradenitis suppurativa    Hx of migraines    Hx: UTI (urinary tract infection)    Medical history non-contributory     MEDICATIONS: Current Outpatient Medications on File Prior to Visit  Medication Sig Dispense Refill   albuterol  (VENTOLIN  HFA) 108 (90 Base) MCG/ACT inhaler Inhale 1-2 puffs into the lungs every 6 (  six) hours as needed for wheezing or shortness of breath. 6.7 g 0    benzoyl peroxide  10 % LIQD Apply 1 Application topically daily. 227 g 5   buPROPion  (WELLBUTRIN  XL) 300 MG 24 hr tablet Take 1 tablet (300 mg total) by mouth daily. 90 tablet 1   cephALEXin  (KEFLEX ) 500 MG capsule Take 1 capsule (500 mg total) by mouth 4 (four) times daily. 20 capsule 0   clindamycin  (CLINDAGEL) 1 % gel Apply topically 2 (two) times daily. Use with the benzoyl peroxide  gel. 30 g 5   cyclobenzaprine  (FLEXERIL ) 10 MG tablet Take 1 tablet (10 mg total) by mouth 3 (three) times daily as needed for muscle spasms. 30 tablet 0   Erenumab -aooe (AIMOVIG ) 140 MG/ML SOAJ Inject 140 mg into the skin every 28 (twenty-eight) days. 1.12 mL 11   gabapentin  (NEURONTIN ) 300 MG capsule Take 1 capsule (300 mg total) by mouth 2 (two) times daily. 180 capsule 0   gabapentin  (NEURONTIN ) 300 MG capsule Take 1 capsule (300 mg total) by mouth at bedtime for 14 days. 14 capsule 0   gabapentin  (NEURONTIN ) 300 MG capsule Take 1 capsule (300 mg total) by mouth at bedtime. 30 capsule 0   HYDROcodone -acetaminophen  (NORCO/VICODIN) 5-325 MG tablet Take 1 tablet by mouth every 4 (four) hours as needed for severe pain (pain score 7-10). 10 tablet 0   HYDROcodone -acetaminophen  (NORCO/VICODIN) 5-325 MG tablet Take 1 tablet by mouth every 8 (eight) hours as needed for up to 5 doses. 5 tablet 0   hydrOXYzine  (ATARAX ) 50 MG tablet Take 2 tablets (100 mg total) by mouth at bedtime. 180 tablet 1   hydrOXYzine  (VISTARIL ) 25 MG capsule Take 1-2 capsules (25-50 mg total) by mouth every 8 (eight) hours as needed. Take 30-45 minutes before donating plasma for anxiety (Patient not taking: Reported on 11/06/2023) 30 capsule 0   ibuprofen  (ADVIL ) 600 MG tablet Take 1 tablet (600 mg total) by mouth every 8 (eight) hours as needed for up to 30 doses for mild pain (pain score 1-3) or moderate pain (pain score 4-6). 30 tablet 0   lamoTRIgine  (LAMICTAL ) 25 MG tablet Take 2 tablets (50 mg total) by mouth at bedtime. 60 tablet 2   nystatin   cream (MYCOSTATIN ) APPLY TO THE AFFECTED AREA(S) 2 TIMES A DAY 30 g 0   nystatin -triamcinolone  ointment (MYCOLOG) APPLY TO THE AFFECTED AREA(S) TWO TIMES A DAY FOR 14 DAYS 30 g 2   phenazopyridine  (PYRIDIUM ) 100 MG tablet Take 1 tablet (100 mg total) by mouth 3 (three) times daily as needed for pain. 10 tablet 0   promethazine  (PHENERGAN ) 25 MG tablet Take 1 tablet (25 mg total) by mouth every 8 (eight) hours as needed for nausea or vomiting. 15 tablet 5   Rimegepant Sulfate (NURTEC) 75 MG TBDP PLACE 1 TABLET BY MOUTH DAILY AS NEEDED (Patient not taking: Reported on 11/06/2023) 8 tablet 5   rizatriptan  (MAXALT -MLT) 10 MG disintegrating tablet Take 1 tablet (10 mg total) by mouth as needed for migraine. May repeat in 2 hours if needed.  Maximum 2 tablets in 24 hours. 10 tablet 5   sildenafil  (VIAGRA ) 50 MG tablet Take 0.5 tablets (25 mg total) by mouth daily as needed for erectile dysfunction. 10 tablet 5   sulfamethoxazole -trimethoprim  (BACTRIM  DS) 800-160 MG tablet Take 1 tablet by mouth 2 (two) times daily. 10 tablet 0   traZODone  (DESYREL ) 100 MG tablet Take 1 tablet (100 mg total) by mouth at bedtime. 30 tablet 0   No  current facility-administered medications on file prior to visit.    ALLERGIES: No Known Allergies  FAMILY HISTORY: Family History  Problem Relation Age of Onset   Migraines Mother    Hypertension Mother    Cancer Maternal Aunt        PANCREATIC   Stroke Paternal Grandmother    Asthma Son       Objective:  *** General: No acute distress.  Patient appears ***-groomed.   Head:  Normocephalic/atraumatic Eyes:  Fundi examined but not visualized Neck: supple, no paraspinal tenderness, full range of motion Heart:  Regular rate and rhythm Lungs:  Clear to auscultation bilaterally Back: No paraspinal tenderness Neurological Exam: alert and oriented.  Speech fluent and not dysarthric, language intact.  CN II-XII intact. Bulk and tone normal, muscle strength 5/5  throughout.  Sensation to light touch intact.  Deep tendon reflexes 2+ throughout, toes downgoing.  Finger to nose testing intact.  Gait normal, Romberg negative.   Janne Members, DO  CC: ***

## 2024-05-08 ENCOUNTER — Ambulatory Visit: Payer: MEDICAID | Admitting: Neurology

## 2024-05-08 ENCOUNTER — Encounter: Payer: Self-pay | Admitting: Neurology

## 2024-05-08 ENCOUNTER — Telehealth: Payer: MEDICAID | Admitting: Physician Assistant

## 2024-05-08 DIAGNOSIS — M5432 Sciatica, left side: Secondary | ICD-10-CM | POA: Diagnosis not present

## 2024-05-08 MED ORDER — IBUPROFEN 800 MG PO TABS
800.0000 mg | ORAL_TABLET | Freq: Three times a day (TID) | ORAL | 0 refills | Status: AC | PRN
Start: 2024-05-08 — End: ?

## 2024-05-08 MED ORDER — CYCLOBENZAPRINE HCL 10 MG PO TABS: 10.0000 mg | ORAL_TABLET | Freq: Three times a day (TID) | ORAL | 0 refills | Status: AC | PRN

## 2024-05-08 NOTE — Patient Instructions (Signed)
 Milly Almas, thank you for joining Angelia Kelp, PA-C for today's virtual visit.  While this provider is not your primary care provider (PCP), if your PCP is located in our provider database this encounter information will be shared with them immediately following your visit.   A [facility] MyChart account gives you access to today's visit and all your visits, tests, and labs performed at [facility] " click here if you don't have a  MyChart account or go to [email or web address]  Consent: (Patient) Kristen Haney provided verbal consent for this virtual visit at the beginning of the encounter.  Current Medications:  Current Outpatient Medications:    gabapentin  (NEURONTIN ) 400 MG capsule, Take 400 mg by mouth., Disp: , Rfl:    ibuprofen  (ADVIL ) 800 MG tablet, Take 1 tablet (800 mg total) by mouth every 8 (eight) hours as needed., Disp: 30 tablet, Rfl: 0   albuterol  (VENTOLIN  HFA) 108 (90 Base) MCG/ACT inhaler, Inhale 1-2 puffs into the lungs every 6 (six) hours as needed for wheezing or shortness of breath., Disp: 6.7 g, Rfl: 0   benzoyl peroxide  10 % LIQD, Apply 1 Application topically daily., Disp: 227 g, Rfl: 5   clindamycin  (CLINDAGEL) 1 % gel, Apply topically 2 (two) times daily. Use with the benzoyl peroxide  gel., Disp: 30 g, Rfl: 5   cyclobenzaprine  (FLEXERIL ) 10 MG tablet, Take 1 tablet (10 mg total) by mouth 3 (three) times daily as needed for muscle spasms., Disp: 30 tablet, Rfl: 0   Erenumab -aooe (AIMOVIG ) 140 MG/ML SOAJ, Inject 140 mg into the skin every 28 (twenty-eight) days., Disp: 1.12 mL, Rfl: 11   HYDROcodone -acetaminophen  (NORCO/VICODIN) 5-325 MG tablet, Take 1 tablet by mouth every 8 (eight) hours as needed for up to 5 doses., Disp: 5 tablet, Rfl: 0   hydrOXYzine  (ATARAX ) 50 MG tablet, Take 2 tablets (100 mg total) by mouth at bedtime., Disp: 180 tablet, Rfl: 1   nystatin  cream (MYCOSTATIN ), APPLY TO THE AFFECTED AREA(S) 2 TIMES A DAY,  Disp: 30 g, Rfl: 0   nystatin -triamcinolone  ointment (MYCOLOG), APPLY TO THE AFFECTED AREA(S) TWO TIMES A DAY FOR 14 DAYS, Disp: 30 g, Rfl: 2   phenazopyridine  (PYRIDIUM ) 100 MG tablet, Take 1 tablet (100 mg total) by mouth 3 (three) times daily as needed for pain., Disp: 10 tablet, Rfl: 0   promethazine  (PHENERGAN ) 25 MG tablet, Take 1 tablet (25 mg total) by mouth every 8 (eight) hours as needed for nausea or vomiting., Disp: 15 tablet, Rfl: 5   rizatriptan  (MAXALT -MLT) 10 MG disintegrating tablet, Take 1 tablet (10 mg total) by mouth as needed for migraine. May repeat in 2 hours if needed.  Maximum 2 tablets in 24 hours., Disp: 10 tablet, Rfl: 5   sildenafil  (VIAGRA ) 50 MG tablet, Take 0.5 tablets (25 mg total) by mouth daily as needed for erectile dysfunction., Disp: 10 tablet, Rfl: 5   sulfamethoxazole -trimethoprim  (BACTRIM  DS) 800-160 MG tablet, Take 1 tablet by mouth 2 (two) times daily., Disp: 10 tablet, Rfl: 0   traZODone  (DESYREL ) 100 MG tablet, Take 1 tablet (100 mg total) by mouth at bedtime., Disp: 30 tablet, Rfl: 0   Medications ordered in this encounter:  Meds ordered this encounter  Medications   cyclobenzaprine  (FLEXERIL ) 10 MG tablet    Sig: Take 1 tablet (10 mg total) by mouth 3 (three) times daily as needed for muscle spasms.    Dispense:  30 tablet    Refill:  0    Supervising Provider:  LAMPTEY, PHILIP O [[id number]]   ibuprofen  (ADVIL ) 800 MG tablet    Sig: Take 1 tablet (800 mg total) by mouth every 8 (eight) hours as needed.    Dispense:  30 tablet    Refill:  0    Supervising Provider:   LAMPTEY, PHILIP O [[id number]]     *If you need refills on other medications prior to your next appointment, please contact your pharmacy*  Follow-Up: Call back or seek an in-person evaluation if the symptoms worsen or if the condition fails to improve as anticipated.  [facility] [phone]  Other Instructions Sciatica Rehab Ask your health care provider which  exercises are safe for you. Do exercises exactly as told by your health care provider and adjust them as directed. It is normal to feel mild stretching, pulling, tightness, or discomfort as you do these exercises. Stop right away if you feel sudden pain or your pain gets worse. Do not begin these exercises until told by your health care provider. Stretching and range-of-motion exercises These exercises warm up your muscles and joints and improve the movement and flexibility of your hips and back. These exercises also help to relieve pain, numbness, and tingling. Sciatic nerve glide  Sit in a chair with your head facing down toward your chest. Place your hands behind your back. Let your shoulders slump forward. Slowly straighten one of your legs while you tilt your head back as if you are looking toward the ceiling. Only straighten your leg as far as you can without making your symptoms worse. Hold this position for __________ seconds. Slowly return your leg and head back to the starting position. Repeat with your other leg. Repeat __________ times. Complete this exercise __________ times a day. Knee to chest with hip adduction and internal rotation  Lie on your back on a firm surface with both legs straight. Bend one of your knees and move it up toward your chest until you feel a gentle stretch in your lower back and buttock. Then, move your knee toward the shoulder that is on the opposite side from your leg. This is hip adduction and internal rotation. Hold your leg in this position by holding on to the front of your knee. Hold this position for __________ seconds. Slowly return to the starting position. Repeat with your other leg. Repeat __________ times. Complete this exercise __________ times a day. Prone extension on elbows  Lie on your abdomen on a firm surface. A bed may be too soft for this exercise. Prop yourself up on your elbows. Use your arms to help lift your chest up until you  feel a gentle stretch in your abdomen and your lower back. This will place some of your body weight on your elbows. If this is uncomfortable, try stacking pillows under your chest. Your hips should stay down, against the surface that you are lying on. Keep your hip and back muscles relaxed. Hold this position for __________ seconds. Slowly relax your upper body and return to the starting position. Repeat __________ times. Complete this exercise __________ times a day. Strengthening exercises These exercises build strength and endurance in your back. Endurance is the ability to use your muscles for a long time, even after they get tired. Pelvic tilt This exercise strengthens the muscles that lie deep in the abdomen. Lie on your back on a firm surface. Bend your knees and keep your feet flat on the surface. Tense your abdominal muscles. Tip your pelvis up toward  the ceiling and flatten your lower back into the firm surface. To help with this exercise, you may place a small towel under your lower back and try to push your back into the towel. Hold this position for __________ seconds. Let your muscles relax completely before you repeat this exercise. Repeat __________ times. Complete this exercise __________ times a day. Alternating arm and leg raises  Get on your hands and knees on a firm surface. If you are on a hard floor, you may want to use padding, such as an exercise mat, to cushion your knees. Line up your arms and legs. Your hands should be directly below your shoulders, and your knees should be directly below your hips. Lift your left leg behind you. At the same time, raise your right arm and straighten it in front of you. Do not lift your leg higher than your hip. Do not lift your arm higher than your shoulder. Keep your abdominal and back muscles tight. Keep your hips facing the ground. Do not arch your back. Keep your balance carefully, and do not hold your breath. Hold this  position for __________ seconds. Slowly return to the starting position. Repeat with your right leg and your left arm. Repeat __________ times. Complete this exercise __________ times a day. Posture and body mechanics Good posture and healthy body mechanics can help to relieve stress in your body's tissues and joints. Body mechanics refers to the movements and positions of your body while you do your daily activities. Posture is part of body mechanics. Good posture means: Your spine is in its natural S-curve position (neutral). Your shoulders are pulled back slightly. Your head is not tipped forward. Follow these guidelines to improve your posture and body mechanics in your everyday activities. Standing  When standing, keep your spine neutral and your feet about hip width apart. Keep a slight bend in your knees. Your ears, shoulders, and hips should line up. When you do a task in which you stand in one place for a long time, place one foot up on a stable object that is 2-4 inches (5-10 cm) high, such as a footstool. This helps keep your spine neutral. Sitting  When sitting, keep your spine neutral and keep your feet flat on the floor. Use a footrest, if necessary, and keep your thighs parallel to the floor. Avoid rounding your shoulders, and avoid tilting your head forward. When working at a desk or a computer, keep your desk at a height where your hands are slightly lower than your elbows. Slide your chair under your desk so you are close enough to maintain good posture. When working at a computer, place your monitor at a height where you are looking straight ahead and you do not have to tilt your head forward or downward to look at the screen. Resting  When lying down and resting, avoid positions that are most painful for you. If you have pain with activities such as sitting, bending, stooping, or squatting, lie in a position in which your body does not bend very much. For example, avoid  curling up on your side with your arms and knees near your chest (fetal position). If you have pain with activities such as standing for a long time or reaching with your arms, lie with your spine in a neutral position and bend your knees slightly. Try the following positions: Lying on your side with a pillow between your knees. Lying on your back with a pillow under your knees.  Lifting  When lifting objects, keep your feet at least shoulder width apart and tighten your abdominal muscles. Bend your knees and hips and keep your spine neutral. It is important to lift using the strength of your legs, not your back. Do not lock your knees straight out. Always ask for help to lift heavy or awkward objects. This information is not intended to replace advice given to you by your health care provider. Make sure you discuss any questions you have with your health care provider. Document Revised: 03/27/2022 Document Reviewed: 03/27/2022 Elsevier Patient Education  2024 [employer]   If you have been instructed to have an in-person evaluation today at a local Urgent Care facility, please use the link below. It will take you to a list of all of our available [facility], including address, phone number and hours of operation. Please do not delay care.  [facility]  If you or a family member do not have a primary care provider, use the link below to schedule a visit and establish care. When you choose a [facility] primary care physician or advanced practice provider, you gain a long-term partner in health. Find a Primary Care Provider  Learn more about [facility]'s in-office and virtual care options: [place] - Get Care Now

## 2024-05-08 NOTE — Progress Notes (Signed)
 Virtual Visit Consent   Kristen Haney, you are scheduled for a virtual visit with a [facility] provider today. Just as with appointments in the office, your consent must be obtained to participate. Your consent will be active for this visit and any virtual visit you may have with one of our providers in the next 365 days. If you have a MyChart account, a copy of this consent can be sent to you electronically.  As this is a virtual visit, video technology does not allow for your provider to perform a traditional examination. This may limit your provider's ability to fully assess your condition. If your provider identifies any concerns that need to be evaluated in person or the need to arrange testing (such as labs, EKG, etc.), we will make arrangements to do so. Although advances in technology are sophisticated, we cannot ensure that it will always work on either your end or our end. If the connection with a video visit is poor, the visit may have to be switched to a telephone visit. With either a video or telephone visit, we are not always able to ensure that we have a secure connection.  By engaging in this virtual visit, you consent to the provision of healthcare and authorize for your insurance to be billed (if applicable) for the services provided during this visit. Depending on your insurance coverage, you may receive a charge related to this service.  I need to obtain your verbal consent now. Are you willing to proceed with your visit today? Kristen Haney has provided verbal consent on 05/08/2024 for a virtual visit (video or telephone). Angelia Kelp, PA-C  Date: 05/08/2024 10:41 AM   Virtual Visit via Video Note   I, Angelia Kelp, connected with  Kristen Haney  ([id number], 1991/09/27) on 05/08/24 at 10:30 AM EDT by a video-enabled telemedicine application and verified that I am speaking with the correct person using two identifiers.  Location: Patient: Virtual Visit Location  Patient: Mobile Provider: Virtual Visit Location Provider: Home Office   I discussed the limitations of evaluation and management by telemedicine and the availability of in person appointments. The patient expressed understanding and agreed to proceed.    History of Present Illness: Kristen Haney is a 33 y.o. who identifies as a female who was assigned female at birth, and is being seen today for sciatica of left side.  HPI: Back Pain This is a recurrent problem. The current episode started 1 to 4 weeks ago (flared recently and seen in person on 04/24/24; meds helped but pain returned over last couple of days). The problem occurs constantly. The problem has been gradually worsening since onset. The pain is present in the gluteal and lumbar spine. The quality of the pain is described as aching and shooting. The pain radiates to the left knee and left thigh. The pain is moderate. The pain is The same all the time. The symptoms are aggravated by sitting, standing and position. Stiffness is present All day. Associated symptoms include leg pain (left). Pertinent negatives include no bladder incontinence, bowel incontinence, fever, numbness, paresis, paresthesias, perianal numbness, tingling or weakness. Risk factors include obesity and lack of exercise. She has tried NSAIDs, bed rest and analgesics (prednisone , ibuprofen , gabapentin , hydrocodone -apap 5-325) for the symptoms. The treatment provided moderate relief.     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   Intentional drug overdose (HCC)    Sciatica  02/03/2018   Sickle cell trait (HCC)    Migraines 06/07/1998    Allergies: No Known Allergies Medications:  Current Outpatient Medications:    gabapentin  (NEURONTIN ) 400 MG capsule, Take 400 mg by mouth., Disp: , Rfl:    ibuprofen  (ADVIL ) 800 MG tablet, Take 1 tablet (800 mg total) by mouth every 8 (eight) hours as needed., Disp: 30 tablet, Rfl: 0   albuterol  (VENTOLIN  HFA) 108 (90 Base) MCG/ACT inhaler, Inhale 1-2 puffs into the lungs every 6 (six) hours as needed for wheezing or shortness of breath., Disp: 6.7 g, Rfl: 0   benzoyl peroxide  10 % LIQD, Apply 1 Application topically daily., Disp: 227 g, Rfl: 5   clindamycin  (CLINDAGEL) 1 % gel, Apply topically 2 (two) times daily. Use with the benzoyl peroxide  gel., Disp: 30 g, Rfl: 5   cyclobenzaprine  (FLEXERIL ) 10 MG tablet, Take 1 tablet (10 mg total) by mouth 3 (three) times daily as needed for muscle spasms., Disp: 30 tablet, Rfl: 0   Erenumab -aooe (AIMOVIG ) 140 MG/ML SOAJ, Inject 140 mg into the skin every 28 (twenty-eight) days., Disp: 1.12 mL, Rfl: 11   HYDROcodone -acetaminophen  (NORCO/VICODIN) 5-325 MG tablet, Take 1 tablet by mouth every 8 (eight) hours as needed for up to 5 doses., Disp: 5 tablet, Rfl: 0   hydrOXYzine  (ATARAX ) 50 MG tablet, Take 2 tablets (100 mg total) by mouth at bedtime., Disp: 180 tablet, Rfl: 1   nystatin  cream (MYCOSTATIN ), APPLY TO THE AFFECTED AREA(S) 2 TIMES A DAY, Disp: 30 g, Rfl: 0   nystatin -triamcinolone  ointment (MYCOLOG), APPLY TO THE AFFECTED AREA(S) TWO TIMES A DAY FOR 14 DAYS, Disp: 30 g, Rfl: 2   phenazopyridine  (PYRIDIUM ) 100 MG tablet, Take 1 tablet (100 mg total) by mouth 3 (three) times daily as needed for pain., Disp: 10 tablet, Rfl: 0   promethazine  (PHENERGAN ) 25 MG tablet, Take 1 tablet (25 mg total) by mouth every 8 (eight) hours as needed for nausea or vomiting., Disp: 15 tablet, Rfl: 5   rizatriptan  (MAXALT -MLT) 10 MG disintegrating tablet, Take 1 tablet (10 mg total) by mouth as needed for  migraine. May repeat in 2 hours if needed.  Maximum 2 tablets in 24 hours., Disp: 10 tablet, Rfl: 5   sildenafil  (VIAGRA ) 50 MG tablet, Take 0.5 tablets (25 mg total) by mouth daily as needed for erectile dysfunction., Disp: 10 tablet, Rfl: 5   sulfamethoxazole -trimethoprim  (BACTRIM  DS) 800-160 MG tablet, Take 1 tablet by mouth 2 (two) times daily., Disp: 10 tablet, Rfl: 0   traZODone  (DESYREL ) 100 MG tablet, Take 1 tablet (100 mg total) by mouth at bedtime., Disp: 30 tablet, Rfl: 0  Observations/Objective: Patient is well-developed, well-nourished in no acute distress.  Resting comfortably Head is normocephalic, atraumatic.  No labored breathing. Speech is clear and coherent with logical content.  Patient is alert and oriented at baseline.    Assessment and Plan: 1. Sciatica of left side (Primary) - cyclobenzaprine  (FLEXERIL ) 10 MG tablet; Take 1 tablet (10 mg total) by mouth 3 (three) times daily as needed for muscle spasms.  Dispense: 30 tablet; Refill: 0 - ibuprofen  (ADVIL ) 800 MG tablet; Take 1 tablet (800 mg total) by mouth every 8 (eight) hours as needed.  Dispense: 30 tablet; Refill: 0  - Suspect sciatica, recurrent - Add Ibuprofen  800mg  and flexeril  5-10mg  - Tylenol  if okay for breakthrough pain - Heat to area - Epsom salt soak if able to get in and out of bath tub safely - Back exercises and stretches provided via AVS - Seek in person evaluation if worsening or fails to improve with treatment   Follow Up Instructions: I discussed the assessment and treatment plan with the patient. The patient was provided an opportunity to ask questions and all were answered. The patient agreed with the plan and demonstrated an understanding of the instructions.  A copy of instructions were sent to the patient via MyChart unless otherwise noted below.    The patient was advised to call back or seek an in-person evaluation if the symptoms worsen or if the condition fails to improve as  anticipated.    Angelia Kelp, PA-C

## 2024-05-19 ENCOUNTER — Telehealth: Payer: MEDICAID | Admitting: Physician Assistant

## 2024-05-19 ENCOUNTER — Telehealth: Payer: MEDICAID

## 2024-05-19 NOTE — Progress Notes (Signed)
 The patient no-showed for appointment despite this provider sending direct link and waiting for at least 10 minutes from appointment time for patient to join.   Hyla Maillard, PA-C

## 2024-05-20 ENCOUNTER — Ambulatory Visit (HOSPITAL_COMMUNITY)
Admission: EM | Admit: 2024-05-20 | Discharge: 2024-05-20 | Disposition: A | Discharge status: Home / Self Care | Payer: MEDICAID | Attending: Psychiatry | Admitting: Psychiatry

## 2024-05-20 ENCOUNTER — Telehealth: Payer: MEDICAID | Admitting: Family Medicine

## 2024-05-20 DIAGNOSIS — Z760 Encounter for issue of repeat prescription: Secondary | ICD-10-CM | POA: Insufficient documentation

## 2024-05-20 DIAGNOSIS — Z91128 Patient's intentional underdosing of medication regimen for other reason: Secondary | ICD-10-CM | POA: Insufficient documentation

## 2024-05-20 DIAGNOSIS — F419 Anxiety disorder, unspecified: Secondary | ICD-10-CM

## 2024-05-20 DIAGNOSIS — F4310 Post-traumatic stress disorder, unspecified: Secondary | ICD-10-CM

## 2024-05-20 DIAGNOSIS — F319 Bipolar disorder, unspecified: Secondary | ICD-10-CM

## 2024-05-20 NOTE — ED Provider Notes (Signed)
 Behavioral Health Urgent Care Medical Screening Exam  Patient Name: Kristen Haney MRN: [id number] Date of Evaluation: 05/20/24 Chief Complaint:   Diagnosis:  Final diagnoses:  Encounter for medication refill    History of Present illness: Kristen Haney is a 33 y.o. female. With a history of bipolar dx, anxiety, PTSD, presented to [facility] voluntarily per the patient she needs her medicine refill.  According to the patient her provider has not seen her in, last time she saw her was 2 months ago when asked was the name of the provider patient could not provide him with the provider what.  According to patient she last took medicine a couple of days ago.  Review of patient's records show that she was seen at urgent care today but they refused to fill her medication.  Writer discussed with patient that she need to come back to the walk-in psychiatry clinic and see a provider for assessment prior to having her medicine refills.  Patient gave agitated stating that " this is bullshit", you'll just wasting my time.  Face-to-face evaluation of patient, patient is alert and oriented x 4, speech is clear, maintaining eye contact.  Patient very nonchalant and very demanding refused to answer most questions when asked.  Patient denies missing adjustments and medicine.  I discussed with patient that we have to ask questions about thought process works.  Patient denies SI, HI, AVH or paranoia.  At this present moment patient does not seem to be a risk to herself or others does not seem to be distress.  Writer discussed with patient that she would need to return tomorrow to the walk-in psychiatry clinic to see if they will refill her medications.  However we would not be able to refill medications based on the fact that you have to do lab work for him further questioning as to what medicine she is taking and stuff that.  Patient is advised to call 911 or return to the nearest ED should she experience any suicidal  ideation or homicidal thoughts or hallucination. At the time of this visit patient does not appear to be in any distress does not appear to be a threat to herself or others.  Recommend discharge for patient to follow up with walking psychiatry information was provided to her.  Flowsheet Row ED from 05/20/2024 in [facility] ED from 04/24/2024 in [facility] Emergency Department at [facility] ED from 04/01/2024 in [facility] Emergency Department at [facility]  C-SSRS RISK CATEGORY No Risk No Risk No Risk       Psychiatric Specialty Exam  Presentation  General Appearance:Casual  Eye Contact:Good  Speech:Clear and Coherent  Speech Volume:Normal  Handedness:Right   Mood and Affect  Mood: Angry  Affect: Congruent   Thought Process  Thought Processes: Coherent  Descriptions of Associations:Intact  Orientation:Full (Time, Place and Person)  Thought Content:WDL    Hallucinations:None  Ideas of Reference:None  Suicidal Thoughts:No  Homicidal Thoughts:No   Sensorium  Memory: Immediate Good  Judgment: Fair  Insight: Fair   Art therapist  Concentration: Fair  Attention Span: Good  Recall: Good  Fund of Knowledge: Good  Language: Good   Psychomotor Activity  Psychomotor Activity: Normal   Assets  Assets: Desire for Improvement   Sleep  Sleep: Fair  Number of hours:  8   Physical Exam: Physical Exam HENT:     Head: Normocephalic.     Nose: Nose normal.  Eyes:     Pupils: Pupils are  equal, round, and reactive to light.  Cardiovascular:     Rate and Rhythm: Normal rate.  Pulmonary:     Effort: Pulmonary effort is normal.  Musculoskeletal:        General: Normal range of motion.     Cervical back: Normal range of motion.  Neurological:     General: No focal deficit present.     Mental Status: She is alert.  Psychiatric:        Mood and Affect: Mood normal.        Thought  Content: Thought content normal.    Review of Systems  Constitutional: Negative.   HENT: Negative.    Eyes: Negative.   Respiratory: Negative.    Cardiovascular: Negative.   Gastrointestinal: Negative.   Genitourinary: Negative.   Musculoskeletal: Negative.   Skin: Negative.   Neurological: Negative.   Psychiatric/Behavioral:  The patient is nervous/anxious.    Blood pressure 116/79, pulse 76, temperature 98.2 F (36.8 C), temperature source Oral, resp. rate 17, last menstrual period 04/17/2024, SpO2 100%. There is no height or weight on file to calculate BMI.  Musculoskeletal: Strength & Muscle Tone: within normal limits Gait & Station: normal Patient leans: N/A   [facility] MSE Discharge Disposition for Follow up and Recommendations: Based on my evaluation the patient does not appear to have an emergency medical condition and can be discharged with resources and follow up care in outpatient services for Medication Management   Dorthea Gauze, NP 05/20/2024, 8:00 PM

## 2024-05-20 NOTE — Progress Notes (Signed)
 Virtual Visit Consent   Kristen Haney, you are scheduled for a virtual visit with a [facility] provider today. Just as with appointments in the office, your consent must be obtained to participate. Your consent will be active for this visit and any virtual visit you may have with one of our providers in the next 365 days. If you have a MyChart account, a copy of this consent can be sent to you electronically.  As this is a virtual visit, video technology does not allow for your provider to perform a traditional examination. This may limit your provider's ability to fully assess your condition. If your provider identifies any concerns that need to be evaluated in person or the need to arrange testing (such as labs, EKG, etc.), we will make arrangements to do so. Although advances in technology are sophisticated, we cannot ensure that it will always work on either your end or our end. If the connection with a video visit is poor, the visit may have to be switched to a telephone visit. With either a video or telephone visit, we are not always able to ensure that we have a secure connection.  By engaging in this virtual visit, you consent to the provision of healthcare and authorize for your insurance to be billed (if applicable) for the services provided during this visit. Depending on your insurance coverage, you may receive a charge related to this service.  I need to obtain your verbal consent now. Are you willing to proceed with your visit today? [name] has provided verbal consent on 05/20/2024 for a virtual visit (video or telephone). Lanetta Pion, NP  Date: 05/20/2024 12:25 PM   Virtual Visit via Video Note   I, Lanetta Pion, connected with  [name]  ([id number], 06-25-1991) on 05/20/24 at 12:00 PM EDT by a video-enabled telemedicine application and verified that I am speaking with the correct person using two identifiers.  Location: Patient: Virtual Visit Location Patient:  Home Provider: Virtual Visit Location Provider: Home Office   I discussed the limitations of evaluation and management by telemedicine and the availability of in person appointments. The patient expressed understanding and agreed to proceed.    History of Present Illness: Kristen Haney is a 33 y.o. who identifies as a female who was assigned female at birth, and is being seen today for possible withdrawal from bipolar meds.  [name] psychiatrist- but she loss her job, loss insurance. Does not have any more of her medications left.  Has her children- usually, but they are with there dad's. They went a week ago when she knew she was not doing well.  Has a mom- but she is in [place].   Discussed not being able to provide medications- and that she needed to be seen. She does not have a car and or money at this time. She threaten to OD on whatever meds she had on hand, and started to cry and hyperventilate on video.  Provider offered her the time needed to get her feelings out and offered to call EMS/911 for her safety. Pt declined reporting she did not want to hurt herself, she just wants help and to feel better. She entered a verbal no self harm, once she was calmer and reviewed options of care and treatment in detail.   Though she declined EMS- is willing to take herself to get car. Provide with info about [facility] info -which I had her repeat back to make sure she  did take it.    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 Use with the benzoyl peroxide  gel., Disp: 30 g, Rfl: 5   cyclobenzaprine  (FLEXERIL ) 10 MG tablet, Take 1 tablet (10 mg total) by mouth 3 (three) times daily as needed for muscle spasms., Disp: 30 tablet, Rfl: 0   Erenumab -aooe (AIMOVIG ) 140 MG/ML SOAJ, Inject 140 mg into the skin every 28 (twenty-eight) days., Disp: 1.12 mL, Rfl: 11   gabapentin  (NEURONTIN ) 400 MG capsule, Take 400 mg by mouth., Disp: , Rfl:    HYDROcodone -acetaminophen  (NORCO/VICODIN) 5-325 MG tablet, Take 1 tablet by mouth every 8 (eight) hours as needed for up to 5 doses., Disp: 5 tablet, Rfl: 0   hydrOXYzine  (ATARAX ) 50 MG tablet, Take 2 tablets (100 mg total) by mouth at bedtime., Disp: 180 tablet, Rfl: 1   ibuprofen  (ADVIL ) 800 MG tablet, Take 1 tablet (800 mg total) by mouth every 8 (eight) hours as needed., Disp: 30 tablet, Rfl: 0   nystatin  cream (MYCOSTATIN ), APPLY TO THE AFFECTED AREA(S) 2 TIMES A DAY, Disp: 30 g, Rfl: 0   nystatin -triamcinolone  ointment (MYCOLOG), APPLY TO THE AFFECTED AREA(S) TWO TIMES A DAY FOR 14 DAYS, Disp: 30 g, Rfl: 2   phenazopyridine  (PYRIDIUM ) 100 MG tablet, Take 1 tablet (100 mg total) by mouth 3 (three) times daily as needed for pain., Disp: 10 tablet, Rfl: 0   promethazine  (PHENERGAN ) 25 MG tablet, Take 1 tablet (25 mg total) by mouth every 8 (eight) hours as needed for nausea or vomiting., Disp: 15 tablet, Rfl: 5   rizatriptan  (MAXALT -MLT) 10 MG disintegrating tablet, Take 1 tablet (10 mg total) by mouth as needed for migraine. May repeat  in 2 hours if needed.  Maximum 2 tablets in 24 hours., Disp: 10 tablet, Rfl: 5   sildenafil  (VIAGRA ) 50 MG tablet, Take 0.5 tablets (25 mg total) by mouth daily as needed for erectile dysfunction., Disp: 10 tablet, Rfl: 5   sulfamethoxazole -trimethoprim  (BACTRIM  DS) 800-160 MG tablet, Take 1 tablet by mouth 2 (two) times daily., Disp: 10 tablet, Rfl: 0   traZODone  (DESYREL ) 100 MG tablet, Take 1 tablet (100 mg total) by mouth at bedtime., Disp: 30 tablet, Rfl: 0  Observations/Objective: Patient is well-developed in acute distress.   Very tearful   Assessment and Plan:   1. Bipolar 1 disorder (HCC) (Primary)   2. PTSD (post-traumatic stress disorder)   3. Anxiety  Though she declined EMS- is willing to take herself to get car. Provide with info about [facility] info -which I had her  repeat back to make sure she did take it.   Patient acknowledged agreement and understanding of the plan.     Follow Up Instructions: I discussed the assessment and treatment plan with the patient. The patient was provided an opportunity to ask questions and all were answered. The patient agreed with the plan and demonstrated an understanding of the instructions.  A copy of instructions were sent to the patient via MyChart unless otherwise noted below.    The patient was advised to call back or seek an in-person evaluation if the symptoms worsen or if the condition fails to improve as anticipated.    Lanetta Pion, NP

## 2024-05-20 NOTE — Patient Instructions (Signed)
 [facility]  Phone [phone]  Address [address]. [place], [place] [address]  Hours Open 24/7. No appointment required.
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
## 2024-05-26 ENCOUNTER — Telehealth: Payer: MEDICAID | Admitting: Family Medicine

## 2024-05-26 NOTE — Progress Notes (Signed)
 The patient no-showed for appointment despite this provider sending direct link, reaching out via phone with no response and waiting for at least 10 minutes from appointment time for patient to join. They will be marked as a NS for this appointment/time.   Lanetta Pion, NP   Appears to be in the ED at [facility] per chart review

## 2024-07-13 ENCOUNTER — Other Ambulatory Visit: Payer: Self-pay | Admitting: Family Medicine

## 2024-07-14 ENCOUNTER — Other Ambulatory Visit (HOSPITAL_COMMUNITY): Payer: Self-pay

## 2024-07-14 ENCOUNTER — Telehealth: Payer: Self-pay | Admitting: Pharmacy Technician

## 2024-07-14 NOTE — Telephone Encounter (Signed)
 Pharmacy Patient Advocate Encounter   Received notification from CoverMyMeds that prior authorization for AIMOVIG  140MG  is required/requested.   Insurance verification completed.   The patient is insured through [facility] .   Per test claim: PA required; PA submitted to above mentioned insurance via CoverMyMeds Key/confirmation #/EOC [id number] Status is pending

## 2024-07-14 NOTE — Telephone Encounter (Signed)
 Pharmacy Patient Advocate Encounter  Received notification from [facility] that Prior Authorization for AIMOVIG  140MG  has been CANCELLED due to   PA #/Case ID/Reference #:  [id number]  TEST BILLING WITH [facility] RETURNS A COPAY OF $4.
